# Patient Record
Sex: Male | Born: 1960 | Race: White | Hispanic: No | Marital: Single | Smoking: Current every day smoker
Health system: Southern US, Community
[De-identification: ages and names within clinical notes are randomized; demographics above are authoritative.]

## PROBLEM LIST (undated history)

## (undated) ENCOUNTER — Emergency Department (HOSPITAL_BASED_OUTPATIENT_CLINIC_OR_DEPARTMENT_OTHER): Admission: EM | Payer: 59 | Source: Home / Self Care

## (undated) DIAGNOSIS — I10 Essential (primary) hypertension: Secondary | ICD-10-CM

## (undated) DIAGNOSIS — I2510 Atherosclerotic heart disease of native coronary artery without angina pectoris: Secondary | ICD-10-CM

## (undated) DIAGNOSIS — I483 Typical atrial flutter: Secondary | ICD-10-CM

## (undated) DIAGNOSIS — E039 Hypothyroidism, unspecified: Secondary | ICD-10-CM

## (undated) DIAGNOSIS — T7840XA Allergy, unspecified, initial encounter: Secondary | ICD-10-CM

## (undated) DIAGNOSIS — IMO0002 Reserved for concepts with insufficient information to code with codable children: Secondary | ICD-10-CM

## (undated) DIAGNOSIS — R011 Cardiac murmur, unspecified: Secondary | ICD-10-CM

## (undated) DIAGNOSIS — E119 Type 2 diabetes mellitus without complications: Secondary | ICD-10-CM

## (undated) DIAGNOSIS — I700 Atherosclerosis of aorta: Secondary | ICD-10-CM

## (undated) DIAGNOSIS — I4892 Unspecified atrial flutter: Secondary | ICD-10-CM

## (undated) DIAGNOSIS — Z87442 Personal history of urinary calculi: Secondary | ICD-10-CM

## (undated) DIAGNOSIS — J449 Chronic obstructive pulmonary disease, unspecified: Secondary | ICD-10-CM

## (undated) DIAGNOSIS — E559 Vitamin D deficiency, unspecified: Secondary | ICD-10-CM

## (undated) DIAGNOSIS — E781 Pure hyperglyceridemia: Secondary | ICD-10-CM

## (undated) DIAGNOSIS — K219 Gastro-esophageal reflux disease without esophagitis: Secondary | ICD-10-CM

## (undated) DIAGNOSIS — F329 Major depressive disorder, single episode, unspecified: Secondary | ICD-10-CM

## (undated) DIAGNOSIS — E291 Testicular hypofunction: Secondary | ICD-10-CM

## (undated) DIAGNOSIS — E785 Hyperlipidemia, unspecified: Secondary | ICD-10-CM

## (undated) DIAGNOSIS — Z5189 Encounter for other specified aftercare: Secondary | ICD-10-CM

## (undated) DIAGNOSIS — N189 Chronic kidney disease, unspecified: Secondary | ICD-10-CM

## (undated) DIAGNOSIS — Z720 Tobacco use: Secondary | ICD-10-CM

## (undated) HISTORY — DX: Typical atrial flutter: I48.3

## (undated) HISTORY — DX: Tobacco use: Z72.0

## (undated) HISTORY — DX: Gastro-esophageal reflux disease without esophagitis: K21.9

## (undated) HISTORY — DX: Atherosclerotic heart disease of native coronary artery without angina pectoris: I25.10

## (undated) HISTORY — DX: Testicular hypofunction: E29.1

## (undated) HISTORY — DX: Vitamin D deficiency, unspecified: E55.9

## (undated) HISTORY — DX: Hyperlipidemia, unspecified: E78.5

## (undated) HISTORY — DX: Reserved for concepts with insufficient information to code with codable children: IMO0002

## (undated) HISTORY — DX: Pure hyperglyceridemia: E78.1

## (undated) HISTORY — DX: Atherosclerosis of aorta: I70.0

## (undated) HISTORY — DX: Essential (primary) hypertension: I10

## (undated) HISTORY — DX: Chronic kidney disease, unspecified: N18.9

## (undated) HISTORY — DX: Allergy, unspecified, initial encounter: T78.40XA

## (undated) HISTORY — PX: ESOPHAGOGASTRODUODENOSCOPY: SHX1529

## (undated) HISTORY — PX: UPPER GASTROINTESTINAL ENDOSCOPY: SHX188

## (undated) HISTORY — PX: LUMBAR DISC SURGERY: SHX700

## (undated) HISTORY — PX: COLONOSCOPY: SHX174

## (undated) HISTORY — DX: Encounter for other specified aftercare: Z51.89

## (undated) HISTORY — DX: Unspecified atrial flutter: I48.92

## (undated) HISTORY — DX: Type 2 diabetes mellitus without complications: E11.9

---
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
## 1989-04-24 HISTORY — PX: CERVICAL FUSION: SHX112

## 1995-10-23 HISTORY — PX: LUMBAR LAMINECTOMY: SHX95

## 1997-12-16 ENCOUNTER — Emergency Department (HOSPITAL_COMMUNITY): Admission: EM | Admit: 1997-12-16 | Discharge: 1997-12-16 | Payer: Self-pay | Admitting: Emergency Medicine

## 1999-04-25 HISTORY — PX: LUMBAR FUSION: SHX111

## 1999-07-01 ENCOUNTER — Emergency Department (HOSPITAL_COMMUNITY): Admission: EM | Admit: 1999-07-01 | Discharge: 1999-07-01 | Payer: Self-pay | Admitting: Emergency Medicine

## 1999-12-07 ENCOUNTER — Ambulatory Visit (HOSPITAL_COMMUNITY): Admission: RE | Admit: 1999-12-07 | Discharge: 1999-12-07 | Payer: Self-pay | Admitting: Family Medicine

## 1999-12-07 ENCOUNTER — Encounter: Payer: Self-pay | Admitting: Family Medicine

## 2000-01-19 ENCOUNTER — Encounter: Payer: Self-pay | Admitting: Neurosurgery

## 2000-01-23 ENCOUNTER — Inpatient Hospital Stay (HOSPITAL_COMMUNITY): Admission: RE | Admit: 2000-01-23 | Discharge: 2000-01-26 | Payer: Self-pay | Admitting: Neurosurgery

## 2000-01-23 ENCOUNTER — Encounter: Payer: Self-pay | Admitting: Neurosurgery

## 2000-02-16 ENCOUNTER — Encounter: Admission: RE | Admit: 2000-02-16 | Discharge: 2000-02-16 | Payer: Self-pay | Admitting: Neurosurgery

## 2000-02-16 ENCOUNTER — Encounter: Payer: Self-pay | Admitting: Neurosurgery

## 2000-05-11 ENCOUNTER — Encounter: Payer: Self-pay | Admitting: Neurosurgery

## 2000-05-11 ENCOUNTER — Encounter: Admission: RE | Admit: 2000-05-11 | Discharge: 2000-05-11 | Payer: Self-pay | Admitting: Neurosurgery

## 2001-04-09 ENCOUNTER — Ambulatory Visit (HOSPITAL_COMMUNITY): Admission: RE | Admit: 2001-04-09 | Discharge: 2001-04-09 | Payer: Self-pay | Admitting: Internal Medicine

## 2001-04-09 ENCOUNTER — Encounter: Payer: Self-pay | Admitting: Internal Medicine

## 2002-08-01 ENCOUNTER — Encounter: Payer: Self-pay | Admitting: Internal Medicine

## 2002-08-01 ENCOUNTER — Ambulatory Visit (HOSPITAL_COMMUNITY): Admission: RE | Admit: 2002-08-01 | Discharge: 2002-08-01 | Payer: Self-pay | Admitting: Internal Medicine

## 2013-04-08 ENCOUNTER — Other Ambulatory Visit: Payer: Self-pay | Admitting: Internal Medicine

## 2013-04-14 ENCOUNTER — Encounter: Payer: Self-pay | Admitting: Internal Medicine

## 2013-04-14 ENCOUNTER — Ambulatory Visit (INDEPENDENT_AMBULATORY_CARE_PROVIDER_SITE_OTHER): Payer: 59 | Admitting: Internal Medicine

## 2013-04-14 VITALS — BP 142/82 | HR 64 | Temp 98.2°F | Resp 18 | Wt 186.6 lb

## 2013-04-14 DIAGNOSIS — N529 Male erectile dysfunction, unspecified: Secondary | ICD-10-CM

## 2013-04-14 DIAGNOSIS — E119 Type 2 diabetes mellitus without complications: Secondary | ICD-10-CM

## 2013-04-14 DIAGNOSIS — Z79899 Other long term (current) drug therapy: Secondary | ICD-10-CM

## 2013-04-14 DIAGNOSIS — I10 Essential (primary) hypertension: Secondary | ICD-10-CM

## 2013-04-14 DIAGNOSIS — M545 Low back pain, unspecified: Secondary | ICD-10-CM | POA: Insufficient documentation

## 2013-04-14 DIAGNOSIS — E1129 Type 2 diabetes mellitus with other diabetic kidney complication: Secondary | ICD-10-CM | POA: Insufficient documentation

## 2013-04-14 DIAGNOSIS — E1169 Type 2 diabetes mellitus with other specified complication: Secondary | ICD-10-CM | POA: Insufficient documentation

## 2013-04-14 DIAGNOSIS — M5136 Other intervertebral disc degeneration, lumbar region: Secondary | ICD-10-CM | POA: Insufficient documentation

## 2013-04-14 DIAGNOSIS — E349 Endocrine disorder, unspecified: Secondary | ICD-10-CM | POA: Insufficient documentation

## 2013-04-14 DIAGNOSIS — E782 Mixed hyperlipidemia: Secondary | ICD-10-CM

## 2013-04-14 DIAGNOSIS — E559 Vitamin D deficiency, unspecified: Secondary | ICD-10-CM

## 2013-04-14 LAB — CBC WITH DIFFERENTIAL/PLATELET
Eosinophils Absolute: 0.2 10*3/uL (ref 0.0–0.7)
Eosinophils Relative: 2 % (ref 0–5)
Hemoglobin: 16.6 g/dL (ref 13.0–17.0)
Lymphocytes Relative: 35 % (ref 12–46)
Lymphs Abs: 3.5 10*3/uL (ref 0.7–4.0)
MCH: 30 pg (ref 26.0–34.0)
MCV: 86.6 fL (ref 78.0–100.0)
Monocytes Relative: 9 % (ref 3–12)
Neutro Abs: 5.3 10*3/uL (ref 1.7–7.7)
Neutrophils Relative %: 54 % (ref 43–77)
Platelets: 240 10*3/uL (ref 150–400)
RBC: 5.53 MIL/uL (ref 4.22–5.81)
RDW: 14.1 % (ref 11.5–15.5)
WBC: 9.9 10*3/uL (ref 4.0–10.5)

## 2013-04-14 LAB — LIPID PANEL
Cholesterol: 121 mg/dL (ref 0–200)
HDL: 30 mg/dL — ABNORMAL LOW (ref >39)
LDL Cholesterol: 41 mg/dL (ref 0–99)
Total CHOL/HDL Ratio: 4 Ratio
VLDL: 50 mg/dL — ABNORMAL HIGH (ref 0–40)

## 2013-04-14 LAB — HEMOGLOBIN A1C: Mean Plasma Glucose: 120 mg/dL — ABNORMAL HIGH (ref <117)

## 2013-04-14 LAB — BASIC METABOLIC PANEL WITH GFR
CO2: 26 mEq/L (ref 19–32)
Calcium: 9.7 mg/dL (ref 8.4–10.5)
Chloride: 105 mEq/L (ref 96–112)
GFR, Est Non African American: 85 mL/min
Glucose, Bld: 75 mg/dL (ref 70–99)
Sodium: 137 mEq/L (ref 135–145)

## 2013-04-14 NOTE — Progress Notes (Signed)
Patient ID: Paul Gates, male   DOB: December 21, 1960, 52 y.o.   MRN: [id number]   This very nice 52 y.o.male presents for 3 month follow up with Hypertension, Hyperlipidemia, T2 Diabetes and Vitamin D Deficiency.    Patient has labile HTN monitored expectantly at least since Dec 2013. BP has been controlled at home. Today's BP: 142/82 mmHg . Patient denies any cardiac type chest pain, palpitations, dyspnea/orthopnea/PND, dizziness, claudication, or dependent edema.   Hyperlipidemia is controlled with diet & meds. Last Cholesterol was 95, Triglycerides were elevated at 174, HDL  Low at 30, and LDL likewise 30.    Also, the patient has history of T2 Diabetes diagnosed in Sept 2013  with last A1c of 6.1% in Sept this year. Patient denies any symptoms of reactive hypoglycemia, diabetic polys, paresthesias or visual blurring.   Further, Patient has history of Vitamin D Deficiency with last vitamin D of 113 in Sept and dose was advised to decrease. Patient supplements vitamin D without any suspected side-effects.  Medication Sig Dispense Refill   Metformin 500 mg ER  1 tab tid pc     Naprosyn 500 mg   1 tab bid prn      Flexeril 10 mg  prn     Vit D 5,000 u  bid     . testosterone cypionate (DEPOTESTOTERONE CYPIONATE) 200 MG/ML injection INJECT 2 ML INTRAMUSCULARLY EVERY 2 WEEKS  10 mL  2      No Known Allergies  PMHx:   Past Medical History  Diagnosis Date  . Hypertension   . Hyperlipidemia   . Diabetes mellitus without complication   . DDD (degenerative disc disease)   . Vitamin D deficiency   . Hypogonadism male   . GERD (gastroesophageal reflux disease)     FHx:    Reviewed / unchanged  SHx:    Reviewed / unchanged  Systems Review: Constitutional: Denies fever, chills, wt changes, headaches, insomnia, fatigue, night sweats, change in appetite. Eyes: Denies redness, blurred vision, diplopia, discharge, itchy, watery eyes.  ENT: Denies discharge, congestion, post nasal drip,  epistaxis, sore throat, earache, hearing loss, dental pain, tinnitus, vertigo, sinus pain, snoring.  CV: Denies chest pain, palpitations, irregular heartbeat, syncope, dyspnea, diaphoresis, orthopnea, PND, claudication, edema. Respiratory: denies cough, dyspnea, DOE, pleurisy, hoarseness, laryngitis, wheezing.  Gastrointestinal: Denies dysphagia, odynophagia, heartburn, reflux, water brash, abdominal pain or cramps, nausea, vomiting, bloating, diarrhea, constipation, hematemesis, melena, hematochezia,  or hemorrhoids. Genitourinary: Denies dysuria, frequency, urgency, nocturia, hesitancy, discharge, hematuria, flank pain. Musculoskeletal: chronic low back pain/ no sciatica  Skin: Denies pruritus, rash, hives, warts, acne, eczema, change in skin lesion(s). Neuro: No weakness, tremor, incoordination, spasms, paresthesia, or pain. Psychiatric: Denies confusion, memory loss, or sensory loss. Endo: Denies change in weight, skin, hair change.  Heme/Lymph: No excessive bleeding, bruising, orenlarged lymph nodes.  BP: 142/82  Pulse: 64  Temp: 98.2 F (36.8 C)  Resp: 18     On Exam: Appears well nourished - in no distress. Eyes: PERRLA, EOMs, conjunctiva no swelling or erythema. Sinuses: No frontal/maxillary tenderness ENT/Mouth: EAC's clear, TM's nl w/o erythema, bulging. Nares clear w/o erythema, swelling, exudates. Oropharynx clear without erythema or exudates. Oral hygiene is good. Tongue normal, non obstructing. Hearing intact.  Neck: Supple. Thyroid nl. Car 2+/2+ without bruits, nodes or JVD. Chest: Respirations nl with BS clear & equal w/o rales, rhonchi, wheezing or stridor.  Cor: Heart sounds normal w/ regular rate and rhythm without sig. murmurs, gallops, clicks, or rubs. Peripheral pulses  normal and equal  without edema.  Abdomen: Soft & bowel sounds normal. Non-tender w/o guarding, rebound, hernias, masses, or organomegaly.  Lymphatics: Unremarkable.  Musculoskeletal: Full ROM all  peripheral extremities, joint stability, 5/5 strength, and normal gait.  Skin: Warm, dry without exposed rashes, lesions, ecchymosis apparent.  Neuro: Cranial nerves intact, reflexes equal bilaterally. Sensory-motor testing grossly intact. Tendon reflexes grossly intact.  Pysch: Alert & oriented x 3. Insight and judgement nl & appropriate. No ideations.  Assessment and Plan:  1. Hypertension - Continue monitor blood pressure at home. Continue diet/meds same.  2. Hyperlipidemia - Continue diet/meds, exercise,& lifestyle modifications. Continue monitor periodic cholesterol/liver & renal functions   3. Diabetes - continue recommend prudent low glycemic diet, weight control, regular exercise, diabetic monitoring and periodic eye exams.  4. Vitamin D Deficiency - Continue supplementation.  Recommended regular exercise, BP monitoring, weight control, and discussed med and SE's. Recommended labs to assess and monitor clinical status. Further disposition pending results of labs.

## 2013-04-14 NOTE — Patient Instructions (Signed)

## 2013-04-15 LAB — INSULIN, FASTING: Insulin fasting, serum: 36 u[IU]/mL — ABNORMAL HIGH (ref 3–28)

## 2013-04-15 LAB — TESTOSTERONE: Testosterone: 251 ng/dL — ABNORMAL LOW (ref 300–890)

## 2013-04-15 LAB — VITAMIN D 25 HYDROXY (VIT D DEFICIENCY, FRACTURES): Vit D, 25-Hydroxy: 87 ng/mL (ref 30–89)

## 2013-04-16 ENCOUNTER — Other Ambulatory Visit: Payer: Self-pay | Admitting: Internal Medicine

## 2013-04-16 ENCOUNTER — Encounter: Payer: Self-pay | Admitting: Internal Medicine

## 2013-04-16 MED ORDER — LEVOTHYROXINE SODIUM 50 MCG PO TABS: 50.0000 ug | ORAL_TABLET | Freq: Every day | ORAL | Status: DC

## 2013-04-17 ENCOUNTER — Other Ambulatory Visit: Payer: Self-pay | Admitting: Internal Medicine

## 2013-04-18 ENCOUNTER — Other Ambulatory Visit: Payer: Self-pay | Admitting: Emergency Medicine

## 2013-04-19 ENCOUNTER — Encounter: Payer: Self-pay | Admitting: Internal Medicine

## 2013-04-20 ENCOUNTER — Encounter: Payer: Self-pay | Admitting: Internal Medicine

## 2013-04-21 ENCOUNTER — Telehealth: Payer: Self-pay | Admitting: Internal Medicine

## 2013-04-21 NOTE — Telephone Encounter (Signed)
Spoke with patient about TSH result.  Patient will get rx and scheduled NV to check TSH.

## 2013-05-22 ENCOUNTER — Ambulatory Visit: Payer: Self-pay

## 2013-05-26 ENCOUNTER — Ambulatory Visit: Payer: Self-pay

## 2013-05-27 ENCOUNTER — Ambulatory Visit (INDEPENDENT_AMBULATORY_CARE_PROVIDER_SITE_OTHER): Payer: 59 | Admitting: *Deleted

## 2013-05-27 DIAGNOSIS — E039 Hypothyroidism, unspecified: Secondary | ICD-10-CM

## 2013-05-27 NOTE — Progress Notes (Signed)
Patient ID: Paul Gates, male   DOB: 1960/08/13, 53 y.o.   MRN: [id number] Patient presents today for recheck TSH levels.  Patient states he started on Levothyroxine 50 mcg as directed at last office visit and takes every morning.  Advised patient we will check TSH today and release results to MyChart for him to view as soon as resulted.

## 2013-05-28 LAB — TSH: TSH: 3.756 u[IU]/mL (ref 0.350–4.500)

## 2013-07-17 ENCOUNTER — Ambulatory Visit (INDEPENDENT_AMBULATORY_CARE_PROVIDER_SITE_OTHER): Payer: 59 | Admitting: Physician Assistant

## 2013-07-17 ENCOUNTER — Encounter: Payer: Self-pay | Admitting: Physician Assistant

## 2013-07-17 VITALS — BP 130/70 | HR 76 | Temp 98.6°F | Resp 16 | Ht 70.0 in | Wt 190.0 lb

## 2013-07-17 DIAGNOSIS — E559 Vitamin D deficiency, unspecified: Secondary | ICD-10-CM

## 2013-07-17 DIAGNOSIS — Z1211 Encounter for screening for malignant neoplasm of colon: Secondary | ICD-10-CM

## 2013-07-17 DIAGNOSIS — E782 Mixed hyperlipidemia: Secondary | ICD-10-CM

## 2013-07-17 DIAGNOSIS — I10 Essential (primary) hypertension: Secondary | ICD-10-CM

## 2013-07-17 DIAGNOSIS — E349 Endocrine disorder, unspecified: Secondary | ICD-10-CM

## 2013-07-17 DIAGNOSIS — Z79899 Other long term (current) drug therapy: Secondary | ICD-10-CM

## 2013-07-17 DIAGNOSIS — Z23 Encounter for immunization: Secondary | ICD-10-CM

## 2013-07-17 DIAGNOSIS — E119 Type 2 diabetes mellitus without complications: Secondary | ICD-10-CM

## 2013-07-17 NOTE — Patient Instructions (Signed)
 Bad carbs also include fruit juice, alcohol, and sweet tea. These are empty calories that do not signal to your brain that you are full.   Please remember the good carbs are still carbs which convert into sugar. So please measure them out no more than 1/2-1 cup of rice, oatmeal, pasta, and beans.  Veggies are however free foods! Pile them on.   I like lean protein at every meal such as chicken, turkey, pork chops, cottage cheese, etc. Just do not fry these meats and please center your meal around vegetable, the meats should be a side dish.   No all fruit is created equal. Please see the list below, the fruit at the bottom is higher in sugars than the fruit at the top   Preventative Care for Adults, Male       REGULAR HEALTH EXAMS:  A routine yearly physical is a good way to check in with your primary care provider about your health and preventive screening. It is also an opportunity to share updates about your health and any concerns you have, and receive a thorough all-over exam.   Most health insurance companies pay for at least some preventative services.  Check with your health plan for specific coverages.  WHAT PREVENTATIVE SERVICES DO MEN NEED?  Adult men should have their weight and blood pressure checked regularly.   Men age 35 and older should have their cholesterol levels checked regularly.  Beginning at age 50 and continuing to age 75, men should be screened for colorectal cancer.  Certain people should may need continued testing until age 85.  Other cancer screening may include exams for testicular and prostate cancer.  Updating vaccinations is part of preventative care.  Vaccinations help protect against diseases such as the flu.  Lab tests are generally done as part of preventative care to screen for anemia and blood disorders, to screen for problems with the kidneys and liver, to screen for bladder problems, to check blood sugar, and to check your cholesterol  level.  Preventative services generally include counseling about diet, exercise, avoiding tobacco, drugs, excessive alcohol consumption, and sexually transmitted infections.    GENERAL RECOMMENDATIONS FOR GOOD HEALTH:  Healthy diet:  Eat a variety of foods, including fruit, vegetables, animal or vegetable protein, such as meat, fish, chicken, and eggs, or beans, lentils, tofu, and grains, such as rice.  Drink plenty of water daily.  Decrease saturated fat in the diet, avoid lots of red meat, processed foods, sweets, fast foods, and fried foods.  Exercise:  Aerobic exercise helps maintain good heart health. At least 30-40 minutes of moderate-intensity exercise is recommended. For example, a brisk walk that increases your heart rate and breathing. This should be done on most days of the week.   Find a type of exercise or a variety of exercises that you enjoy so that it becomes a part of your daily life.  Examples are running, walking, swimming, water aerobics, and biking.  For motivation and support, explore group exercise such as aerobic class, spin class, Zumba, Yoga,or  martial arts, etc.    Set exercise goals for yourself, such as a certain weight goal, walk or run in a race such as a 5k walk/run.  Speak to your primary care provider about exercise goals.  Disease prevention:  If you smoke or chew tobacco, find out from your caregiver how to quit. It can literally save your life, no matter how long you have been a tobacco user. If you   do not use tobacco, never begin.   Maintain a healthy diet and normal weight. Increased weight leads to problems with blood pressure and diabetes.   The Body Mass Index or BMI is a way of measuring how much of your body is fat. Having a BMI above 27 increases the risk of heart disease, diabetes, hypertension, stroke and other problems related to obesity. Your caregiver can help determine your BMI and based on it develop an exercise and dietary program to  help you achieve or maintain this important measurement at a healthful level.  High blood pressure causes heart and blood vessel problems.  Persistent high blood pressure should be treated with medicine if weight loss and exercise do not work.   Fat and cholesterol leaves deposits in your arteries that can block them. This causes heart disease and vessel disease elsewhere in your body.  If your cholesterol is found to be high, or if you have heart disease or certain other medical conditions, then you may need to have your cholesterol monitored frequently and be treated with medication.   Ask if you should have a stress test if your history suggests this. A stress test is a test done on a treadmill that looks for heart disease. This test can find disease prior to there being a problem.  Avoid drinking alcohol in excess (more than two drinks per day).  Avoid use of street drugs. Do not share needles with anyone. Ask for professional help if you need assistance or instructions on stopping the use of alcohol, cigarettes, and/or drugs.  Brush your teeth twice a day with fluoride toothpaste, and floss once a day. Good oral hygiene prevents tooth decay and gum disease. The problems can be painful, unattractive, and can cause other health problems. Visit your dentist for a routine oral and dental check up and preventive care every 6-12 months.   Look at your skin regularly.  Use a mirror to look at your back. Notify your caregivers of changes in moles, especially if there are changes in shapes, colors, a size larger than a pencil eraser, an irregular border, or development of new moles.  Safety:  Use seatbelts 100% of the time, whether driving or as a passenger.  Use safety devices such as hearing protection if you work in environments with loud noise or significant background noise.  Use safety glasses when doing any work that could send debris in to the eyes.  Use a helmet if you ride a bike or motorcycle.   Use appropriate safety gear for contact sports.  Talk to your caregiver about gun safety.  Use sunscreen with a SPF (or skin protection factor) of 15 or greater.  Lighter skinned people are at a greater risk of skin cancer. Don't forget to also wear sunglasses in order to protect your eyes from too much damaging sunlight. Damaging sunlight can accelerate cataract formation.   Practice safe sex. Use condoms. Condoms are used for birth control and to help reduce the spread of sexually transmitted infections (or STIs).  Some of the STIs are gonorrhea (the clap), chlamydia, syphilis, trichomonas, herpes, HPV (human papilloma virus) and HIV (human immunodeficiency virus) which causes AIDS. The herpes, HIV and HPV are viral illnesses that have no cure. These can result in disability, cancer and death.   Keep carbon monoxide and smoke detectors in your home functioning at all times. Change the batteries every 6 months or use a model that plugs into the wall.   Vaccinations:  Stay   up to date with your tetanus shots and other required immunizations. You should have a booster for tetanus every 10 years. Be sure to get your flu shot every year, since 5%-20% of the [place] population comes down with the flu. The flu vaccine changes each year, so being vaccinated once is not enough. Get your shot in the fall, before the flu season peaks.   Other vaccines to consider:  Pneumococcal vaccine to protect against certain types of pneumonia.  This is normally recommended for adults age 65 or older.  However, adults younger than 51 years old with certain underlying conditions such as diabetes, heart or lung disease should also receive the vaccine.  Shingles vaccine to protect against Varicella Zoster if you are older than age 60, or younger than 51 years old with certain underlying illness.  Hepatitis A vaccine to protect against a form of infection of the liver by a virus acquired from food.  Hepatitis B vaccine to  protect against a form of infection of the liver by a virus acquired from blood or body fluids, particularly if you work in health care.  If you plan to travel internationally, check with your local health department for specific vaccination recommendations.  Cancer Screening:  Most routine colon cancer screening begins at the age of 50. On a yearly basis, doctors may provide special easy to use take-home tests to check for hidden blood in the stool. Sigmoidoscopy or colonoscopy can detect the earliest forms of colon cancer and is life saving. These tests use a small camera at the end of a tube to directly examine the colon. Speak to your caregiver about this at age 50, when routine screening begins (and is repeated every 5 years unless early forms of pre-cancerous polyps or small growths are found).   At the age of 50 men usually start screening for prostate cancer every year. Screening may begin at a younger age for those with higher risk. Those at higher risk include African-Americans or having a family history of prostate cancer. There are two types of tests for prostate cancer:   Prostate-specific antigen (PSA) testing. Recent studies raise questions about prostate cancer using PSA and you should discuss this with your caregiver.   Digital rectal exam (in which your doctor's lubricated and gloved finger feels for enlargement of the prostate through the anus).   Screening for testicular cancer.  Do a monthly exam of your testicles. Gently roll each testicle between your thumb and fingers, feeling for any abnormal lumps. The best time to do this is after a hot shower or bath when the tissues are looser. Notify your caregivers of any lumps, tenderness or changes in size or shape immediately.     

## 2013-07-17 NOTE — Progress Notes (Signed)
HPI 53 y.o. male  presents for 3 month follow up with hypertension, hyperlipidemia, diabetes and vitamin D. His blood pressure has been controlled at home, today their BP is BP: 130/70 mmHg He does workout, bikes during the summer. He denies chest pain, shortness of breath, dizziness.  He is not on cholesterol medication and denies myalgias. His cholesterol is at goal. The cholesterol last visit was:   Lab Results  Component Value Date   CHOL 121 04/14/2013   HDL 30* 04/14/2013   LDLCALC 41 04/14/2013   TRIG 252* 04/14/2013   CHOLHDL 4.0 04/14/2013   He has been working on diet and exercise for Diabetes, and denies paresthesia of the feet, polydipsia, polyuria and visual disturbances. Last A1C in the office was:  Lab Results  Component Value Date   HGBA1C 5.8* 04/14/2013   Patient is on Vitamin D supplement.   Overdue for colonoscopy- will refer for this.   Clinical Support on 05/27/2013  Component Date Value Ref Range Status  . TSH 05/27/2013 3.756  0.350 - 4.500 uIU/mL Final   Current Medications:  Current Outpatient Prescriptions on File Prior to Visit  Medication Sig Dispense Refill  . Cholecalciferol (VITAMIN D-3) 5000 UNITS TABS Take 10,000 Units by mouth daily.      . cyclobenzaprine (FLEXERIL) 10 MG tablet Take 10 mg by mouth daily. Takes prn      . levothyroxine (SYNTHROID) 50 MCG tablet Take 1 tablet (50 mcg total) by mouth daily before breakfast.  90 tablet  99  . metFORMIN (GLUCOPHAGE-XR) 500 MG 24 hr tablet Take 500 mg by mouth 3 (three) times daily with meals.       . Multiple Vitamin (MULTIVITAMIN) tablet Take 1 tablet by mouth daily.      . naproxen (NAPROSYN) 500 MG tablet Take 500 mg by mouth 2 (two) times daily with a meal. Takes prn      . testosterone cypionate (DEPOTESTOTERONE CYPIONATE) 200 MG/ML injection INJECT 2 ML INTRAMUSCULARLY EVERY 2 WEEKS  10 mL  2   No current facility-administered medications on file prior to visit.   Medical History:  Past  Medical History  Diagnosis Date  . Hypertension   . Hyperlipidemia   . Diabetes mellitus without complication   . DDD (degenerative disc disease)   . Vitamin D deficiency   . Hypogonadism male   . GERD (gastroesophageal reflux disease)    Allergies: No Known Allergies   Review of Systems: [X]  = complains of  [ ]  = denies  General: Fatigue [ ]  Fever [ ]  Chills [ ]  Weakness [ ]   Insomnia [ ]  Eyes: Redness [ ]  Blurred vision [ ]  Diplopia [ ]   ENT: Congestion [ ]  Sinus Pain [ ]  Post Nasal Drip [ ]  Sore Throat [ ]  Earache [ ]   Cardiac: Chest pain/pressure [ ]  SOB [ ]  Orthopnea [ ]   Palpitations [ ]   Paroxysmal nocturnal dyspnea[ ]  Claudication [ ]  Edema [ ]   Pulmonary: Cough [ ]  Wheezing[ ]   SOB [ ]   Snoring [ ]   GI: Nausea [ ]  Vomiting[ ]  Dysphagia[ ]  Heartburn[ ]  Abdominal pain [ ]  Constipation [ ] ; Diarrhea [ ] ; BRBPR [ ]  Melena[ ]  GU: Hematuria[ ]  Dysuria [ ]  Nocturia[ ]  Urgency [ ]   Hesitancy [ ]  Discharge [ ]  Neuro: Headaches[ ]  Vertigo[ ]  Paresthesias[ ]  Spasm [ ]  Speech changes [ ]  Incoordination [ ]   Ortho: Arthritis [ ]  Joint pain [ ]  Muscle pain [ ]  Joint swelling [ ]   Back Pain [ ]  Skin:  Rash [ ]   Pruritis [ ]  Change in skin lesion [ ]   Psych: Depression[ ]  Anxiety[ ]  Confusion [ ]  Memory loss [ ]   Heme/Lypmh: Bleeding [ ]  Bruising [ ]  Enlarged lymph nodes [ ]   Endocrine: Visual blurring [ ]  Paresthesia [ ]  Polyuria [ ]  Polydypsea [ ]    Heat/cold intolerance [ ]  Hypoglycemia [ ]   Family history- Review and unchanged Social history- Review and unchanged Physical Exam: BP 130/70  Pulse 76  Temp(Src) 98.6 F (37 C)  Resp 16  Ht 5\' 10"  (1.778 m)  Wt 190 lb (86.183 kg)  BMI 27.26 kg/m2 Wt Readings from Last 3 Encounters:  07/17/13 190 lb (86.183 kg)  04/14/13 186 lb 9.6 oz (84.641 kg)   General Appearance: Well nourished, in no apparent distress. Eyes: PERRLA, EOMs, conjunctiva no swelling or erythema Sinuses: No Frontal/maxillary tenderness ENT/Mouth: Ext aud canals  clear, TMs without erythema, bulging. No erythema, swelling, or exudate on post pharynx.  Tonsils not swollen or erythematous. Hearing normal.  Neck: Supple, thyroid normal.  Respiratory: Respiratory effort normal, BS equal bilaterally without rales, rhonchi, wheezing or stridor.  Cardio: RRR with no MRGs. Brisk peripheral pulses without edema.  Abdomen: Soft, + BS.  Non tender, no guarding, rebound, hernias, masses. Lymphatics: Non tender without lymphadenopathy.  Musculoskeletal: Full ROM, 5/5 strength, normal gait.  Skin: Warm, dry without rashes, lesions, ecchymosis.  Neuro: Cranial nerves intact. No cerebellar symptoms. Sensation intact.  Psych: Awake and oriented X 3, normal affect, Insight and Judgment appropriate.   Assessment and Plan:  Hypertension: Continue medication, monitor blood pressure at home.  Continue DASH diet. Cholesterol: Continue diet and exercise. Check cholesterol.  Diabetes-Continue diet and exercise. Check A1C Vitamin D Def- check level and continue medications.  Colonoscopy- will schedule TDAP today  Continue diet and meds as discussed. Further disposition pending results of labs. Discussed med's effects and SE's.    Vicie Mutters 3:55 PM

## 2013-07-18 ENCOUNTER — Encounter: Payer: Self-pay | Admitting: Internal Medicine

## 2013-07-18 LAB — CBC WITH DIFFERENTIAL/PLATELET
Basophils Absolute: 0 10*3/uL (ref 0.0–0.1)
Basophils Relative: 0 % (ref 0–1)
EOS ABS: 0.1 10*3/uL (ref 0.0–0.7)
EOS PCT: 1 % (ref 0–5)
HCT: 45.9 % (ref 39.0–52.0)
HEMOGLOBIN: 15.7 g/dL (ref 13.0–17.0)
LYMPHS ABS: 3.4 10*3/uL (ref 0.7–4.0)
Lymphocytes Relative: 33 % (ref 12–46)
MCH: 29.9 pg (ref 26.0–34.0)
MCHC: 34.2 g/dL (ref 30.0–36.0)
MCV: 87.4 fL (ref 78.0–100.0)
MONO ABS: 0.8 10*3/uL (ref 0.1–1.0)
MONOS PCT: 8 % (ref 3–12)
Neutro Abs: 6 10*3/uL (ref 1.7–7.7)
Neutrophils Relative %: 58 % (ref 43–77)
Platelets: 237 10*3/uL (ref 150–400)
RBC: 5.25 MIL/uL (ref 4.22–5.81)
RDW: 14.1 % (ref 11.5–15.5)
WBC: 10.4 10*3/uL (ref 4.0–10.5)

## 2013-07-18 LAB — TSH: TSH: 1.892 u[IU]/mL (ref 0.350–4.500)

## 2013-07-18 LAB — HEPATIC FUNCTION PANEL
ALT: 20 U/L (ref 0–53)
AST: 18 U/L (ref 0–37)
Albumin: 4.2 g/dL (ref 3.5–5.2)
Alkaline Phosphatase: 59 U/L (ref 39–117)
BILIRUBIN DIRECT: 0.1 mg/dL (ref 0.0–0.3)
Indirect Bilirubin: 0.2 mg/dL (ref 0.2–1.2)
Total Bilirubin: 0.3 mg/dL (ref 0.2–1.2)
Total Protein: 6.3 g/dL (ref 6.0–8.3)

## 2013-07-18 LAB — BASIC METABOLIC PANEL WITH GFR
BUN: 19 mg/dL (ref 6–23)
CO2: 27 meq/L (ref 19–32)
CREATININE: 0.94 mg/dL (ref 0.50–1.35)
Calcium: 9.5 mg/dL (ref 8.4–10.5)
Chloride: 105 mEq/L (ref 96–112)
GFR, Est African American: >89 mL/min
GFR, Est Non African American: >89 mL/min
GLUCOSE: 79 mg/dL (ref 70–99)
Potassium: 4.5 mEq/L (ref 3.5–5.3)
Sodium: 140 mEq/L (ref 135–145)

## 2013-07-18 LAB — LIPID PANEL
CHOL/HDL RATIO: 3.2 ratio
Cholesterol: 86 mg/dL (ref 0–200)
HDL: 27 mg/dL — ABNORMAL LOW (ref >39)
LDL CALC: 22 mg/dL (ref 0–99)
Triglycerides: 184 mg/dL — ABNORMAL HIGH (ref <150)
VLDL: 37 mg/dL (ref 0–40)

## 2013-07-18 LAB — VITAMIN D 25 HYDROXY (VIT D DEFICIENCY, FRACTURES): Vit D, 25-Hydroxy: 96 ng/mL — ABNORMAL HIGH (ref 30–89)

## 2013-07-18 LAB — MAGNESIUM: MAGNESIUM: 1.8 mg/dL (ref 1.5–2.5)

## 2013-07-18 LAB — HEMOGLOBIN A1C
HEMOGLOBIN A1C: 6 % — AB (ref <5.7)
Mean Plasma Glucose: 126 mg/dL — ABNORMAL HIGH (ref <117)

## 2013-08-06 ENCOUNTER — Encounter: Payer: Self-pay | Admitting: Physician Assistant

## 2013-08-06 ENCOUNTER — Telehealth: Payer: Self-pay | Admitting: *Deleted

## 2013-08-06 NOTE — Telephone Encounter (Signed)
PT was here in [facility] got test results on mychart & didn't see Testosterone level? Pt says this was low in Dec so he was seeing what he should do at this point?

## 2013-08-12 ENCOUNTER — Ambulatory Visit: Payer: Self-pay | Admitting: Physician Assistant

## 2013-08-13 ENCOUNTER — Ambulatory Visit (AMBULATORY_SURGERY_CENTER): Payer: Self-pay | Admitting: *Deleted

## 2013-08-13 VITALS — Ht 69.0 in | Wt 192.0 lb

## 2013-08-13 DIAGNOSIS — Z1211 Encounter for screening for malignant neoplasm of colon: Secondary | ICD-10-CM

## 2013-08-13 MED ORDER — MOVIPREP 100 G PO SOLR: ORAL | Status: DC

## 2013-08-13 NOTE — Progress Notes (Signed)
Patient denies any allergies to eggs or soy. Patient denies any problems with anesthesia. No oxygen use at home per patient. No diet/wt loss pills. Emmi instructions given to patient on colonoscopy.  

## 2013-08-15 ENCOUNTER — Encounter: Payer: Self-pay | Admitting: Internal Medicine

## 2013-08-26 ENCOUNTER — Ambulatory Visit: Payer: Self-pay | Admitting: Physician Assistant

## 2013-08-27 ENCOUNTER — Ambulatory Visit (AMBULATORY_SURGERY_CENTER): Payer: 59 | Admitting: Internal Medicine

## 2013-08-27 ENCOUNTER — Encounter: Payer: Self-pay | Admitting: Internal Medicine

## 2013-08-27 VITALS — BP 132/72 | HR 68 | Temp 98.2°F | Resp 14 | Ht 69.0 in | Wt 192.0 lb

## 2013-08-27 DIAGNOSIS — Z1211 Encounter for screening for malignant neoplasm of colon: Secondary | ICD-10-CM

## 2013-08-27 LAB — GLUCOSE, CAPILLARY
GLUCOSE-CAPILLARY: 76 mg/dL (ref 70–99)
GLUCOSE-CAPILLARY: 77 mg/dL (ref 70–99)
Glucose-Capillary: 82 mg/dL (ref 70–99)
Glucose-Capillary: 95 mg/dL (ref 70–99)

## 2013-08-27 MED ORDER — SODIUM CHLORIDE 0.9 % IV SOLN: 500.0000 mL | INTRAVENOUS | Status: DC

## 2013-08-27 NOTE — Progress Notes (Signed)
A/ox3 pleased with MAC, report to April RN 

## 2013-08-27 NOTE — Op Note (Signed)
[place]  [address]. [place], [id number]   COLONOSCOPY PROCEDURE REPORT  PATIENT: Paul Gates, Paul Gates  MR#: [id number] BIRTHDATE: 08/05/1960 , 52  yrs. old GENDER: Male ENDOSCOPIST: Lafayette Dragon, MD REFERRED [id number] Melford Aase, M.D. ,[place] Shows PROCEDURE DATE:  08/27/2013 PROCEDURE:   Colonoscopy, screening First Screening Colonoscopy - Avg.  risk and is 50 yrs.  old or older Yes.  Prior Negative Screening - Now for repeat screening. N/A  History of Adenoma - Now for follow-up colonoscopy & has been > or = to 3 yrs.  N/A  Polyps Removed Today? No.  Recommend repeat exam, <10 yrs? No. ASA CLASS:   Class I INDICATIONS:Average risk patient for colon cancer. MEDICATIONS: MAC sedation, administered by CRNA and propofol (Diprivan) 250mg  IV  DESCRIPTION OF PROCEDURE:   After the risks benefits and alternatives of the procedure were thoroughly explained, informed consent was obtained.  A digital rectal exam revealed no abnormalities of the rectum.   The LB [id number]  endoscope was introduced through the anus and advanced to the cecum, which was identified by both the appendix and ileocecal valve. No adverse events experienced.   The quality of the prep was good, using MoviPrep  The instrument was then slowly withdrawn as the colon was fully examined.      COLON FINDINGS: Small internal hemorrhoids were found.  Retroflexed views revealed no abnormalities. The time to cecum=2 minutes 05 seconds.  Withdrawal time=6 minutes 37 seconds.  The scope was withdrawn and the procedure completed. COMPLICATIONS: There were no complications.  ENDOSCOPIC IMPRESSION: Small internal hemorrhoids  RECOMMENDATIONS: high fiber diet Recall colonoscopy in 10 years   eSigned:  Lafayette Dragon, MD 08/27/2013 2:25 PM   cc:   PATIENT NAME:  [name], [name] MR#: [id number]

## 2013-08-27 NOTE — Patient Instructions (Signed)

## 2013-08-27 NOTE — Progress Notes (Signed)
Pt BS was 77 when getting to recovery, pt is asymptomatic, pt states he feels fine "that is a great BS".  Gave apple juice to pt while in recovery and rechecked BS, reading was 76, pt states he feels fine does not feel like BS is low. Pt is asymptomatic. Pt was given grape juice for the road, pt states BS never gets low and he is going to get something to eat as soon as he leaves. Pt is discharged.

## 2013-08-28 ENCOUNTER — Telehealth: Payer: Self-pay

## 2013-08-28 ENCOUNTER — Ambulatory Visit: Payer: Self-pay | Admitting: Physician Assistant

## 2013-08-28 NOTE — Telephone Encounter (Signed)
  Follow up Call-  Call back number 08/27/2013  Post procedure Call Back phone  # [phone]  Permission to leave phone message Yes     Patient questions:  Do you have a fever, pain , or abdominal swelling? no Pain Score  0 *  Have you tolerated food without any problems? yes  Have you been able to return to your normal activities? yes  Do you have any questions about your discharge instructions: Diet   no Medications  no Follow up visit  no  Do you have questions or concerns about your Care? no  Actions: * If pain score is 4 or above: No action needed, pain <4.  No problems per the pt. maw

## 2013-09-03 ENCOUNTER — Other Ambulatory Visit: Payer: Self-pay | Admitting: Internal Medicine

## 2013-09-09 ENCOUNTER — Other Ambulatory Visit: Payer: Self-pay | Admitting: Internal Medicine

## 2013-09-23 ENCOUNTER — Emergency Department (HOSPITAL_COMMUNITY): Payer: 59

## 2013-09-23 ENCOUNTER — Ambulatory Visit: Payer: Self-pay | Admitting: Physician Assistant

## 2013-09-23 ENCOUNTER — Encounter (HOSPITAL_COMMUNITY): Payer: Self-pay | Admitting: Emergency Medicine

## 2013-09-23 ENCOUNTER — Emergency Department (HOSPITAL_COMMUNITY)
Admission: EM | Admit: 2013-09-23 | Discharge: 2013-09-23 | Disposition: A | Discharge status: Home / Self Care | Payer: 59 | Attending: Emergency Medicine | Admitting: Emergency Medicine

## 2013-09-23 DIAGNOSIS — I10 Essential (primary) hypertension: Secondary | ICD-10-CM | POA: Insufficient documentation

## 2013-09-23 DIAGNOSIS — Z8719 Personal history of other diseases of the digestive system: Secondary | ICD-10-CM | POA: Insufficient documentation

## 2013-09-23 DIAGNOSIS — Z791 Long term (current) use of non-steroidal anti-inflammatories (NSAID): Secondary | ICD-10-CM | POA: Insufficient documentation

## 2013-09-23 DIAGNOSIS — Z8739 Personal history of other diseases of the musculoskeletal system and connective tissue: Secondary | ICD-10-CM | POA: Insufficient documentation

## 2013-09-23 DIAGNOSIS — N200 Calculus of kidney: Secondary | ICD-10-CM

## 2013-09-23 DIAGNOSIS — E119 Type 2 diabetes mellitus without complications: Secondary | ICD-10-CM | POA: Insufficient documentation

## 2013-09-23 DIAGNOSIS — Z79899 Other long term (current) drug therapy: Secondary | ICD-10-CM | POA: Insufficient documentation

## 2013-09-23 DIAGNOSIS — N23 Unspecified renal colic: Secondary | ICD-10-CM

## 2013-09-23 DIAGNOSIS — F17200 Nicotine dependence, unspecified, uncomplicated: Secondary | ICD-10-CM | POA: Insufficient documentation

## 2013-09-23 LAB — URINALYSIS, ROUTINE W REFLEX MICROSCOPIC
Glucose, UA: NEGATIVE mg/dL
Ketones, ur: NEGATIVE mg/dL
Nitrite: NEGATIVE
Protein, ur: 30 mg/dL — AB
Specific Gravity, Urine: 1.027 (ref 1.005–1.030)
Urobilinogen, UA: 1 mg/dL (ref 0.0–1.0)
pH: 6 (ref 5.0–8.0)

## 2013-09-23 LAB — BASIC METABOLIC PANEL
BUN: 22 mg/dL (ref 6–23)
CHLORIDE: 103 meq/L (ref 96–112)
CO2: 26 meq/L (ref 19–32)
Calcium: 9.5 mg/dL (ref 8.4–10.5)
Creatinine, Ser: 1.18 mg/dL (ref 0.50–1.35)
GFR calc Af Amer: 82 mL/min — ABNORMAL LOW (ref >90)
GFR calc non Af Amer: 70 mL/min — ABNORMAL LOW (ref >90)
Glucose, Bld: 118 mg/dL — ABNORMAL HIGH (ref 70–99)
Potassium: 5 mEq/L (ref 3.7–5.3)
Sodium: 139 mEq/L (ref 137–147)

## 2013-09-23 LAB — CBC
HEMATOCRIT: 47.5 % (ref 39.0–52.0)
Hemoglobin: 16.3 g/dL (ref 13.0–17.0)
MCH: 30.1 pg (ref 26.0–34.0)
MCHC: 34.3 g/dL (ref 30.0–36.0)
MCV: 87.6 fL (ref 78.0–100.0)
PLATELETS: 196 10*3/uL (ref 150–400)
RBC: 5.42 MIL/uL (ref 4.22–5.81)
RDW: 14.3 % (ref 11.5–15.5)
WBC: 12.1 10*3/uL — ABNORMAL HIGH (ref 4.0–10.5)

## 2013-09-23 LAB — URINE MICROSCOPIC-ADD ON

## 2013-09-23 MED ORDER — MORPHINE SULFATE 4 MG/ML IJ SOLN
4.0000 mg | Freq: Once | INTRAMUSCULAR | Status: AC
  Administered 2013-09-23: 4 mg via INTRAVENOUS
  Filled 2013-09-23: qty 1

## 2013-09-23 MED ORDER — OXYCODONE-ACETAMINOPHEN 5-325 MG PO TABS: 1.0000 | ORAL_TABLET | Freq: Three times a day (TID) | ORAL | Status: DC | PRN

## 2013-09-23 MED ORDER — ONDANSETRON HCL 4 MG PO TABS: 4.0000 mg | ORAL_TABLET | Freq: Four times a day (QID) | ORAL | Status: DC

## 2013-09-23 MED ORDER — SODIUM CHLORIDE 0.9 % IV BOLUS (SEPSIS): 250.0000 mL | Freq: Once | INTRAVENOUS | Status: DC

## 2013-09-23 MED ORDER — ONDANSETRON HCL 4 MG/2ML IJ SOLN
4.0000 mg | Freq: Once | INTRAMUSCULAR | Status: AC
  Administered 2013-09-23: 4 mg via INTRAVENOUS
  Filled 2013-09-23: qty 2

## 2013-09-23 NOTE — ED Provider Notes (Signed)
CSN: [id number]     Arrival date & time 09/23/13  1525 History   First MD Initiated Contact with Patient 09/23/13 1543     Chief Complaint  Patient presents with  . Flank Pain     (Consider location/radiation/quality/duration/timing/severity/associated sxs/prior Treatment) The history is provided by the patient. No language interpreter was used.  Paul Gates is a 53 year old male with past medical history of hypertension, hyperlipidemia, diabetes, degenerative disc disease, GERD, hypogonadism, history of lumbar fusion/laminectomy presenting to the ED with left-sided flank pain that started approximately 45 minutes prior to arrival to the emergency department. Stated over the past month or so he's been having intermittent pubic discomfort-stated that he's been having urgency but a socially when going to the bathroom. Stated that today he's been having hematuria with each urination. Stated he started to experience a constant piercing sensation is now waxing and waning localized to the left flank. Reported that at times the pain radiates from the left groin to the left flank. Stated he's not taken anything for discomfort. Denied history of kidney stones. Denied pressure, dysuria, swelling, groin pain, groin swelling, bleeding, discharge, fever, chills, nausea, vomiting, chest pain, shortness of breath, difficulty breathing, penile swelling, testicular swelling. PCP Dr. Melford Aase - next appointment 10/09/2013  Past Medical History  Diagnosis Date  . Hypertension   . Hyperlipidemia   . Diabetes mellitus without complication   . DDD (degenerative disc disease)   . Vitamin D deficiency   . Hypogonadism male   . GERD (gastroesophageal reflux disease)    Past Surgical History  Procedure Laterality Date  . Lumbar disc surgery    . 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 Esophagogastroduodenoscopy  [phone]    duodenal ulcer  . Lumbar laminectomy  7/97  . Lumbar fusion  2001  . Cervical  fusion  1991   Family History  Problem Relation Age of Onset  . Hypertension Mother   . Stroke Mother   . Cancer Mother     ovarian  . Alcohol abuse Father   . Hypertension Father   . Diabetes Maternal Uncle   . Cancer Maternal Grandmother     lung  . Cancer Paternal Grandfather     lung  . Colon cancer Neg Hx   . Stomach cancer Neg Hx    History  Substance Use Topics  . Smoking status: Current Every Day Smoker -- 0.75 packs/day for 30 years    Types: Cigarettes  . Smokeless tobacco: Never Used  . Alcohol Use: 1.5 oz/week    3 drink(s) per week    Review of Systems  Constitutional: Negative for fever and chills.  Respiratory: Negative for cough and shortness of breath.   Cardiovascular: Negative for chest pain.  Gastrointestinal: Positive for abdominal pain. Negative for nausea, vomiting, constipation, blood in stool and anal bleeding.  Genitourinary: Positive for urgency, hematuria and flank pain. Negative for dysuria, decreased urine volume, penile swelling, scrotal swelling, penile pain and testicular pain.  Musculoskeletal: Negative for back pain and neck pain.  Neurological: Negative for weakness.      Allergies  Review of patient's allergies indicates no known allergies.  Home Medications   Prior to Admission medications   Medication Sig Start Date End Date Taking? Authorizing Provider  B-D 3CC LUER-LOK SYR 21GX1" 21G X 1" 3 ML MISC USE AS DIRECTED 09/09/13  Yes Vicie Mutters, PA-C  cetirizine (ZYRTEC) 10 MG tablet Take 10 mg by mouth daily.   Yes  Historical Provider, MD  Cholecalciferol (VITAMIN D-3) 5000 UNITS TABS Take 10,000 Units by mouth daily.   Yes Historical Provider, MD  cyclobenzaprine (FLEXERIL) 10 MG tablet Take 10 mg by mouth daily as needed for muscle spasms. Takes prn   Yes Historical Provider, MD  dextroamphetamine (DEXEDRINE SPANSULE) 15 MG 24 hr capsule Take 15 mg by mouth daily as needed.   Yes Historical Provider, MD  levothyroxine  (SYNTHROID) 50 MCG tablet Take 1 tablet (50 mcg total) by mouth daily before breakfast. 04/16/13  Yes [name], MD  metFORMIN (GLUCOPHAGE) 500 MG tablet Take 500 mg by mouth 3 (three) times daily.   Yes Historical Provider, MD  naproxen (NAPROSYN) 500 MG tablet Take 500 mg by mouth 2 (two) times daily with a meal. Takes bid   Yes Historical Provider, MD  ONE TOUCH ULTRA TEST test strip USE TO TEST BLOOD SUGAR 3 TIMES DAILY   Yes Vicie Mutters, PA-C  testosterone cypionate (DEPOTESTOTERONE CYPIONATE) 200 MG/ML injection INJECT 2 ML INTRAMUSCULARLY EVERY 2 WEEKS 04/08/13  Yes Vicie Mutters, PA-C  vitamin C (ASCORBIC ACID) 500 MG tablet Take 500 mg by mouth 3 (three) times daily as needed (immune support).   Yes Historical Provider, MD  ondansetron (ZOFRAN) 4 MG tablet Take 1 tablet (4 mg total) by mouth every 6 (six) hours. 09/23/13   Torence Palmeri, PA-C  oxyCODONE-acetaminophen (PERCOCET/ROXICET) 5-325 MG per tablet Take 1 tablet by mouth every 8 (eight) hours as needed for moderate pain or severe pain. 09/23/13   Shagun Wordell, PA-C   BP 155/80  Pulse 81  Temp(Src) 97.7 F (36.5 C) (Oral)  Resp 20  SpO2 100% Physical Exam  Nursing note and vitals reviewed. Constitutional: He is oriented to person, place, and time. He appears well-developed and well-nourished. No distress.  HENT:  Head: Normocephalic and atraumatic.  Mouth/Throat: Oropharynx is clear and moist. No oropharyngeal exudate.  Eyes: Conjunctivae and EOM are normal. Pupils are equal, round, and reactive to light. Right eye exhibits no discharge. Left eye exhibits no discharge.  Neck: Normal range of motion. Neck supple. No tracheal deviation present.  Cardiovascular: Normal rate, regular rhythm and normal heart sounds.  Exam reveals no friction rub.   No murmur heard. Pulmonary/Chest: Effort normal and breath sounds normal. No respiratory distress. He has no wheezes. He has no rales.  Abdominal: Soft. Bowel sounds are  normal. He exhibits no distension. There is tenderness in the suprapubic area. There is CVA tenderness (left sided). There is no rebound and no guarding.    Negative abdominal distention Abdomen soft upon palpation Suprapubic discomfort upon palpation Positive left-sided CVA tenderness  Genitourinary:  Pelvic exam: Negative swelling, erythema, formation, lesions, sores, deformities identified to the penis/scrotal region. Negative penile swelling identified. Negative discharge. Negative blood in the urethral meatus. Negative tenderness upon palpation to the testicles bilaterally. Negative hydrocele. Negative axial. Negative high riding testicle. Exam chaperoned with tech  Musculoskeletal: Normal range of motion.  Full ROM to upper and lower extremities without difficulty noted, negative ataxia noted.  Lymphadenopathy:    He has no cervical adenopathy.  Neurological: He is alert and oriented to person, place, and time. No cranial nerve deficit. He exhibits normal muscle tone. Coordination normal.  Skin: Skin is warm and dry. No rash noted. He is not diaphoretic. No erythema.  Psychiatric: He has a normal mood and affect. His behavior is normal. Thought content normal.    ED Course  Procedures (including critical care time)  Results for orders  placed during the hospital encounter of 09/23/13  URINALYSIS, ROUTINE W REFLEX MICROSCOPIC      Result Value Ref Range   Color, Urine RED (*) YELLOW   APPearance CLOUDY (*) CLEAR   Specific Gravity, Urine 1.027  1.005 - 1.030   pH 6.0  5.0 - 8.0   Glucose, UA NEGATIVE  NEGATIVE mg/dL   Hgb urine dipstick LARGE (*) NEGATIVE   Bilirubin Urine SMALL (*) NEGATIVE   Ketones, ur NEGATIVE  NEGATIVE mg/dL   Protein, ur 30 (*) NEGATIVE mg/dL   Urobilinogen, UA 1.0  0.0 - 1.0 mg/dL   Nitrite NEGATIVE  NEGATIVE   Leukocytes, UA SMALL (*) NEGATIVE  CBC      Result Value Ref Range   WBC 12.1 (*) 4.0 - 10.5 K/uL   RBC 5.42  4.22 - 5.81 MIL/uL    Hemoglobin 16.3  13.0 - 17.0 g/dL   HCT 47.5  39.0 - 52.0 %   MCV 87.6  78.0 - 100.0 fL   MCH 30.1  26.0 - 34.0 pg   MCHC 34.3  30.0 - 36.0 g/dL   RDW 14.3  11.5 - 15.5 %   Platelets 196  150 - 400 K/uL  BASIC METABOLIC PANEL      Result Value Ref Range   Sodium 139  137 - 147 mEq/L   Potassium 5.0  3.7 - 5.3 mEq/L   Chloride 103  96 - 112 mEq/L   CO2 26  19 - 32 mEq/L   Glucose, Bld 118 (*) 70 - 99 mg/dL   BUN 22  6 - 23 mg/dL   Creatinine, Ser 1.18  0.50 - 1.35 mg/dL   Calcium 9.5  8.4 - 10.5 mg/dL   GFR calc non Af Amer 70 (*) >90 mL/min   GFR calc Af Amer 82 (*) >90 mL/min  URINE MICROSCOPIC-ADD ON      Result Value Ref Range   Squamous Epithelial / LPF RARE  RARE   WBC, UA 3-6  <3 WBC/hpf   RBC / HPF TOO NUMEROUS TO COUNT  <3 RBC/hpf   Bacteria, UA RARE  RARE   Crystals CA OXALATE CRYSTALS (*) NEGATIVE    Labs Review Labs Reviewed  URINALYSIS, ROUTINE W REFLEX MICROSCOPIC - Abnormal; Notable for the following:    Color, Urine RED (*)    APPearance CLOUDY (*)    Hgb urine dipstick LARGE (*)    Bilirubin Urine SMALL (*)    Protein, ur 30 (*)    Leukocytes, UA SMALL (*)    All other components within normal limits  CBC - Abnormal; Notable for the following:    WBC 12.1 (*)    All other components within normal limits  BASIC METABOLIC PANEL - Abnormal; Notable for the following:    Glucose, Bld 118 (*)    GFR calc non Af Amer 70 (*)    GFR calc Af Amer 82 (*)    All other components within normal limits  URINE MICROSCOPIC-ADD ON - Abnormal; Notable for the following:    Crystals CA OXALATE CRYSTALS (*)    All other components within normal limits  URINE CULTURE    Imaging Review Ct Abdomen Pelvis Wo Contrast  09/23/2013   CLINICAL DATA:  Left flank pain.  EXAM: CT ABDOMEN AND PELVIS WITHOUT CONTRAST  TECHNIQUE: Multidetector CT imaging of the abdomen and pelvis was performed following the standard protocol without IV contrast.  COMPARISON:  None.  FINDINGS: The  lung bases are clear.  No pleural effusion or pulmonary nodule. The heart is mildly enlarged. No pericardial effusion. The distal esophagus is grossly normal.  The unenhanced appearance of the liver is unremarkable. No focal hepatic lesions or intrahepatic biliary dilatation. The gallbladder is normal. No common bile duct dilatation. The pancreas is normal. The spleen is normal in size. No focal lesions. The adrenal glands are unremarkable.  There is moderate hydronephrosis of the left kidney and perinephric interstitial changes and fluid consistent with a moderate grade obstruction. There is a proximal left ureteral calculus measuring 5 mm and located near the ureteropelvic junction. No distal ureteral calculi. No right-sided ureteral calculi. No bladder calculi.  The stomach, duodenum, small bowel and colon are grossly normal without oral contrast. The appendix is normal. No mesenteric or retroperitoneal mass or adenopathy. The aorta is normal in caliber. Minimal scattered atherosclerotic calcifications.  The bladder, prostate gland and seminal vesicles are unremarkable. Central prostate gland calcifications are noted. No pelvic mass, adenopathy or free pelvic fluid collections. No inguinal mass or hernia.  The bony structures are unremarkable. Lumbar fusion hardware is noted.  IMPRESSION: 5 mm left UPJ calculus causing moderate grade obstruction.   Electronically Signed   By: Kalman Jewels M.D.   On: 09/23/2013 17:16     EKG Interpretation None      MDM   Final diagnoses:  Nephrolithiasis  Ureteral colic   Medications  morphine 4 MG/ML injection 4 mg (4 mg Intravenous Given 09/23/13 1753)  ondansetron (ZOFRAN) injection 4 mg (4 mg Intravenous Given 09/23/13 1754)    Filed Vitals:   09/23/13 1538 09/23/13 1755  BP: 157/96 155/80  Pulse: 69 81  Temp: 98.3 F (36.8 C) 97.7 F (36.5 C)  TempSrc: Oral Oral  Resp: 20 20  SpO2: 99% 100%    CBC mild elevated white blood cell count of 12.1. BMP  noted properly functioning kidneys-BUN 22, creatinine 1.18. Anion gap of 10.0 mEq per liter. Urinalysis noted urine to be red with large hemoglobin, small leukocytes with white blood cell count of 3-6, red blood cells too numerous to count oxalate crystals identified in the urine. Urine culture pending. CT abdomen and pelvis without contrast identified a 5 mm stone in the left UPJ calculus causing moderate grade obstruction with moderate hydronephrosis the left kidney and perinephric interstitial changes and fluid consistent with a moderate grade obstruction. Doubt DKA. Doubt pyelonephritis. Patient presenting to the ED with nephrolithiasis. Pain medications administered in ED setting-patient responded well. Patient tolerated by mouth fluids without difficulty-negative episodes of emesis while in ED setting. Patient stable, afebrile. Patient not septic appearing. Discharged patient. Discharged patient with pain medications, antibiotics. Referred patient to primary care provider neurologist. Discussed with patient to rest and drink plenty of fluids. Patient was discharged with a strainer. Discussed with patient to closely monitor symptoms and if symptoms are to worsen or change to report back to the ED - strict return instructions given.  Patient agreed to plan of care, understood, all questions answered.     Jamse Mead, PA-C 09/24/13 1146

## 2013-09-23 NOTE — ED Notes (Signed)
Pt given AVS summary and explained thoroughly. Given strainer and given instructions on use and follow up with urology. No other questions at this time. Ambulatory.

## 2013-09-23 NOTE — ED Notes (Signed)
Initial contact-A&Ox4. Reports hematuria x2 starting today. Denies dysuria. No hx kidney stones or BPH. Denies UTI history. Has had prior back surgery so does have chronic minor back pain. Doesn't know if back pain is from kidneys or back. Did have pelvic pain x1 month ago. Had "slow urine flow without pain" at that time. Has follow up 6/18 of this month. Ambulatory.

## 2013-09-23 NOTE — ED Notes (Signed)
Pt able to drink fluids without nausea, vomiting. No issues noted.

## 2013-09-23 NOTE — ED Notes (Signed)
Gave patient urnal and notified him that we need a sample.

## 2013-09-23 NOTE — Discharge Instructions (Signed)
Please call your doctor for a followup appointment within 24-48 hours. When you talk to your doctor please let them know that you were seen in the emergency department and have them acquire all of your records so that they can discuss the findings with you and formulate a treatment plan to fully care for your new and ongoing problems. Please call and set-up an appointment with your primary care provider to be seen and re-assessed by the end of this week Please call and set-up an appointment with Urology regarding kidney stone Please rest and stay hydrated Please drink plenty of fluids Please strain all urine Please take medications as prescribed - while on pain medications there is to be no drinking alcohol, driving, operating any heavy machinery. If there is extra please dispose in a proper manner. Please do not take any extra Tylenol for this can lead to Tylenol overdose and liver issues. Please continue to monitor symptoms closely and if symptoms are to worsen or change (fever greater than 101, chills, chest pain, shortness of breath, difficulty breathing, numbness, tingling, worsening or changes to abdominal pain, blood in the urine, blood clots in the urine, worsening pain, inability to keep food or fluids down, blood in the stools, black tarry stools, weakness, fainting, nausea, vomiting) please report back to the ED immediately   Kidney Stones Kidney stones (urolithiasis) are deposits that form inside your kidneys. The intense pain is caused by the stone moving through the urinary tract. When the stone moves, the ureter goes into spasm around the stone. The stone is usually passed in the urine.  CAUSES   A disorder that makes certain neck glands produce too much parathyroid hormone (primary hyperparathyroidism).  A buildup of uric acid crystals, similar to gout in your joints.  Narrowing (stricture) of the ureter.  A kidney obstruction present at birth (congenital obstruction).  Previous  surgery on the kidney or ureters.  Numerous kidney infections. SYMPTOMS   Feeling sick to your stomach (nauseous).  Throwing up (vomiting).  Blood in the urine (hematuria).  Pain that usually spreads (radiates) to the groin.  Frequency or urgency of urination. DIAGNOSIS   Taking a history and physical exam.  Blood or urine tests.  CT scan.  Occasionally, an examination of the inside of the urinary bladder (cystoscopy) is performed. TREATMENT   Observation.  Increasing your fluid intake.  Extracorporeal shock wave lithotripsy This is a noninvasive procedure that uses shock waves to break up kidney stones.  Surgery may be needed if you have severe pain or persistent obstruction. There are various surgical procedures. Most of the procedures are performed with the use of small instruments. Only small incisions are needed to accommodate these instruments, so recovery time is minimized. The size, location, and chemical composition are all important variables that will determine the proper choice of action for you. Talk to your health care provider to better understand your situation so that you will minimize the risk of injury to yourself and your kidney.  HOME CARE INSTRUCTIONS   Drink enough water and fluids to keep your urine clear or pale yellow. This will help you to pass the stone or stone fragments.  Strain all urine through the provided strainer. Keep all particulate matter and stones for your health care provider to see. The stone causing the pain may be as small as a grain of salt. It is very important to use the strainer each and every time you pass your urine. The collection of your stone  will allow your health care provider to analyze it and verify that a stone has actually passed. The stone analysis will often identify what you can do to reduce the incidence of recurrences.  Only take over-the-counter or prescription medicines for pain, discomfort, or fever as directed  by your health care provider.  Make a follow-up appointment with your health care provider as directed.  Get follow-up X-rays if required. The absence of pain does not always mean that the stone has passed. It may have only stopped moving. If the urine remains completely obstructed, it can cause loss of kidney function or even complete destruction of the kidney. It is your responsibility to make sure X-rays and follow-ups are completed. Ultrasounds of the kidney can show blockages and the status of the kidney. Ultrasounds are not associated with any radiation and can be performed easily in a matter of minutes. SEEK MEDICAL CARE IF:  You experience pain that is progressive and unresponsive to any pain medicine you have been prescribed. SEEK IMMEDIATE MEDICAL CARE IF:   Pain cannot be controlled with the prescribed medicine.  You have a fever or shaking chills.  The severity or intensity of pain increases over 18 hours and is not relieved by pain medicine.  You develop a new onset of abdominal pain.  You feel faint or pass out.  You are unable to urinate. MAKE SURE YOU:   Understand these instructions.  Will watch your condition.  Will get help right away if you are not doing well or get worse. Document Released: 04/10/2005 Document Revised: 12/11/2012 Document Reviewed: 09/11/2012 [facility] Patient Information 2014 [place].  Diet for Kidney Stones Kidney stones are small, hard masses that form inside your kidneys. They are made up of salts and minerals and often form when high levels build up in the urine. The minerals can then start to build up, crystalize, and stick together to form stones. There are several different types of kidney stones. The following types of stones may be influenced by dietary factors:   Calcium Oxalate Stones. An oxalate is a salt found in certain foods. Within the body, calcium can combine with oxalates to form calcium oxalate stones, which can be  excreted in the urine in high amounts. This is the most common type of kidney stone.  Calcium Phosphate Stones. These stones may occur when the pH of the urine becomes too high, or less acidic, from too much calcium being excreted in the urine. The pH is a measure of how acidic or basic a substance is.  Uric Acid Stones. This type of stone occurs when the pH of the urine becomes too low, or very acidic, because substances called purines build up in the urine. Purines are found in animal proteins. When the urine is highly concentrated with acid, uric acid kidney stones can form.  Other risk factors for kidney stones include genetics, environment, and being overweight. Your caregiver may ask you to follow specific diet guidelines based on the type of stone you have to lessen the chances of your body making more kidney stones.  GENERAL GUIDELINES FOR ALL TYPES OF STONES  Drink plenty of fluid. Drink 12 16 cups of fluid a day, drinking mainly water.This is the most important thing you can do to prevent the formation of future kidney stones.  Maintain a healthy weight. Your caregiver or dietitian can help you determine what a healthy weight is for you. If you are overweight, weight loss may help prevent the formation  of future kidney stones.  Eat a diet adequate in animal protein. Too much animal protein can contribute to the formation of stones. Your dietitian can help you determine how much protein you should be eating. Avoid low carbohydrate, high protein diets.  Follow a balanced eating approach. The DASH diet, which stands for "Dietary Approaches to Stop Hypertension," is an effective meal plan for reducing stone formation. This diet is high in fruits, vegetables, dairy, and whole grains and low in animal protein. Ask your caregiver or dietitian for information about the DASH diet. ADDITIONAL DIET GUIDELINES FOR CALCIUM STONES Avoid foods high in salt. This includes table salt, salt seasonings,  MSG, soy sauce, cured and processed meats, salted crackers and snack foods, fast food, and canned soups and foods. Ask your caregiver or dietitian for information about reducing sodium in your diet or following the low sodium diet.  Ensure adequate calcium intake. Use the following table for calcium guidelines:  Men 73 years old and younger  1000 mg/day.  Men 65 years old and older  1500 mg/day.  Women 51 51 years old  1000 mg/day.  Women 50 years and older  1500 mg/day. Your dietitian can help you determine if you are getting enough calcium in your diet. Foods that are high in calcium include dairy products, broccoli, cheese, yogurt, and pudding. If you need to take a calcium supplement, take it only in the form of calcium citrate.  Avoid foods high in oxalate. Be sure that any supplements you take do not contain more than 500 mg of vitamin C. Vitamin C is converted into oxalate in the body. You do not need to avoid fruits and vegetables high in vitamin C.   Grains: High-fiber or bran cereal, whole-wheat bread, grits, barley, buckwheat, amaranth, pretzels, and fruitcake.  Vegetables: Dried beans, wax beans, dark leafy greens, eggplant, leeks, okra, parsley, rutabaga, tomato paste, watercress, zucchini, and escarole.  Fruit: Dried apricots, red currants, figs, kiwi, and rhubarb.  Meat and Meat Substitutes: Soybeans and foods made from soy (soyburger, miso), dried beans, peanut butter.  Milk: Chocolate milk mixes and soymilk.  Fats and Oils: Nuts (peanuts, almonds, pecans, cashews, hazelnuts) and nut butters, sesame seeds, and tDahini paste.  Condiments/Miscellaneous: Chocolate, carob, marmalade, poppy seeds, instant iced tea, and juice from high-oxalate fruits.  Document Released: 08/05/2010 Document Revised: 10/10/2011 Document Reviewed: 09/25/2011 [facility] Patient Information 2014 [place].

## 2013-09-23 NOTE — ED Notes (Signed)
Pt reports L groin and L flank pain today with hematuria.  Pt reports urinary urgency with retention.  Denies hx of kidney stones at this time.

## 2013-09-25 LAB — URINE CULTURE
CULTURE: NO GROWTH
Colony Count: NO GROWTH
Special Requests: NORMAL

## 2013-09-25 NOTE — ED Provider Notes (Signed)
Medical screening examination/treatment/procedure(s) were performed by non-physician practitioner and as supervising physician I was immediately available for consultation/collaboration.   EKG Interpretation None        Lorne Winkels F Torin Modica, MD 09/25/13 0137 

## 2013-10-09 ENCOUNTER — Ambulatory Visit (INDEPENDENT_AMBULATORY_CARE_PROVIDER_SITE_OTHER): Payer: 59 | Admitting: Internal Medicine

## 2013-10-09 ENCOUNTER — Encounter: Payer: Self-pay | Admitting: Internal Medicine

## 2013-10-09 VITALS — BP 154/84 | HR 72 | Temp 97.9°F | Resp 16 | Ht 70.0 in | Wt 192.2 lb

## 2013-10-09 DIAGNOSIS — E559 Vitamin D deficiency, unspecified: Secondary | ICD-10-CM

## 2013-10-09 DIAGNOSIS — F32A Depression, unspecified: Secondary | ICD-10-CM

## 2013-10-09 DIAGNOSIS — E1129 Type 2 diabetes mellitus with other diabetic kidney complication: Secondary | ICD-10-CM

## 2013-10-09 DIAGNOSIS — F988 Other specified behavioral and emotional disorders with onset usually occurring in childhood and adolescence: Secondary | ICD-10-CM | POA: Insufficient documentation

## 2013-10-09 DIAGNOSIS — R7402 Elevation of levels of lactic acid dehydrogenase (LDH): Secondary | ICD-10-CM

## 2013-10-09 DIAGNOSIS — Z113 Encounter for screening for infections with a predominantly sexual mode of transmission: Secondary | ICD-10-CM

## 2013-10-09 DIAGNOSIS — R740 Nonspecific elevation of levels of transaminase and lactic acid dehydrogenase [LDH]: Secondary | ICD-10-CM

## 2013-10-09 DIAGNOSIS — Z125 Encounter for screening for malignant neoplasm of prostate: Secondary | ICD-10-CM

## 2013-10-09 DIAGNOSIS — Z111 Encounter for screening for respiratory tuberculosis: Secondary | ICD-10-CM

## 2013-10-09 DIAGNOSIS — I10 Essential (primary) hypertension: Secondary | ICD-10-CM

## 2013-10-09 DIAGNOSIS — Z1212 Encounter for screening for malignant neoplasm of rectum: Secondary | ICD-10-CM

## 2013-10-09 DIAGNOSIS — F329 Major depressive disorder, single episode, unspecified: Secondary | ICD-10-CM

## 2013-10-09 DIAGNOSIS — Z0000 Encounter for general adult medical examination without abnormal findings: Secondary | ICD-10-CM

## 2013-10-09 DIAGNOSIS — Z79899 Other long term (current) drug therapy: Secondary | ICD-10-CM | POA: Insufficient documentation

## 2013-10-09 DIAGNOSIS — R7401 Elevation of levels of liver transaminase levels: Secondary | ICD-10-CM

## 2013-10-09 HISTORY — DX: Depression, unspecified: F32.A

## 2013-10-09 LAB — CBC WITH DIFFERENTIAL/PLATELET
BASOS ABS: 0 10*3/uL (ref 0.0–0.1)
BASOS PCT: 0 % (ref 0–1)
EOS ABS: 0.2 10*3/uL (ref 0.0–0.7)
Eosinophils Relative: 1 % (ref 0–5)
HCT: 48.1 % (ref 39.0–52.0)
Hemoglobin: 16.5 g/dL (ref 13.0–17.0)
LYMPHS PCT: 31 % (ref 12–46)
Lymphs Abs: 4.7 10*3/uL — ABNORMAL HIGH (ref 0.7–4.0)
MCH: 30.3 pg (ref 26.0–34.0)
MCHC: 34.3 g/dL (ref 30.0–36.0)
MCV: 88.3 fL (ref 78.0–100.0)
Monocytes Absolute: 1.1 10*3/uL — ABNORMAL HIGH (ref 0.1–1.0)
Monocytes Relative: 7 % (ref 3–12)
Neutro Abs: 9.3 10*3/uL — ABNORMAL HIGH (ref 1.7–7.7)
Neutrophils Relative %: 61 % (ref 43–77)
PLATELETS: 250 10*3/uL (ref 150–400)
RBC: 5.45 MIL/uL (ref 4.22–5.81)
RDW: 14.5 % (ref 11.5–15.5)
WBC: 15.2 10*3/uL — AB (ref 4.0–10.5)

## 2013-10-09 NOTE — Progress Notes (Signed)
Patient ID: Paul Gates, male   DOB: 14-Aug-1960, 53 y.o.   MRN: [id number]   Annual Screening Comprehensive Examination  This very nice 53 y.o.SWM presents for complete physical.  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 Patient is followed by Dr Toy Care for Depression & ADD.   Labile HTN predates many years with expectent monitoring.. Patient's BP has been controlled at home.Today's BP: 154/84 mmHg. Patient denies any cardiac symptoms as chest pain, palpitations, shortness of breath, dizziness or ankle swelling.   Patient's hyperlipidemia is controlled with diet. Patient denies myalgias or other medication SE's. Last Lipids were at goal in March 2015 as below.  Lab Results  Component Value Date   CHOL 86 07/17/2013   HDL 27* 07/17/2013   LDLCALC 22 07/17/2013   TRIG 184* 07/17/2013   CHOLHDL 3.2 07/17/2013    Patient has T2_NIDDM since Sept 2013 with A1c 6.4% and has Stage 2 CKD (GFR 82 ml/min). His last A1c was 6.0% in Mar 2015. Patient denies reactive hypoglycemic symptoms, visual blurring, diabetic polys, or paresthesias.   Finally, patient has history of Vitamin D Deficiency of 33 in 05/2012  and last vitamin D was 96 in Mar 2015.  Medication Sig  . B-D 3CC LUER-LOK SYR 21GX1" 21G X 1" 3 ML MISC USE AS DIRECTED  . cetirizine 10 MG tablet Take 10 mg by mouth daily.  Marland Kitchen VITAMIN D 5000 UNITS TABS Take 10,000 Units by mouth daily.  . cyclobenzaprine 10 MG tablet Take 10 mg by mouth daily as needed for muscle spasms. T  . DEXEDRINE SPANSULE 15 MG 24 hr capsule Take 15 mg by mouth daily as needed.  Marland Kitchen levothyroxine  50 MCG tablet Take 1 tablet (50 mcg total) by mouth daily before breakfast.  . metFORMIN  500 MG XR tablet Take 500 mg by mouth 3 (three) times daily.  . naproxen ( 500 MG tablet Take 500 mg by mouth 2 (two) times daily with a meal. Takes bid  . ondansetron (ZOFRAN) 4 MG tablet Take 1 tablet (4 mg total) by mouth every 6  (six) hours.  . ONE TOUCH ULTRA TEST test strip USE TO TEST BLOOD SUGAR 3 TIMES DAILY  . oxyCODONE-acetaminophen (PERCOCET/ROXICET) 5-325 MG per tablet Take 1 tablet by mouth every 8 (eight) hours as needed for moderate pain or severe pain.  . DEPOTESTOTERONE CYPI 200 MG/ML  INJECT 2 ML INTRAMUSCULARLY EVERY 2 WEEKS  . vitamin C  500 MG tablet Take 500 mg by mouth 3 (three) times daily as needed (immune support).   No Known Allergies  Past Medical History  Diagnosis Date  . Hypertension   . Hyperlipidemia   . Diabetes mellitus without complication   . DDD (degenerative disc disease)   . Vitamin D deficiency   . Hypogonadism male   . GERD (gastroesophageal reflux disease)    Past Surgical History  Procedure Laterality Date  . Lumbar disc surgery    . 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 Esophagogastroduodenoscopy  [phone]    duodenal ulcer  . Lumbar laminectomy  7/97  . Lumbar fusion  2001  . Cervical fusion  1991   Family History  Problem Relation Age of Onset  . Hypertension Mother   . Stroke Mother   . Cancer Mother     ovarian  . Alcohol abuse Father   . Hypertension Father   . Diabetes Maternal Uncle   .  Cancer Maternal Grandmother     lung  . Cancer Paternal Grandfather     lung  . Colon cancer Neg Hx   . Stomach cancer Neg Hx    History   Social History  . Marital Status: Single    Spouse Name: N/A    Number of Children: N/A  . Years of Education: N/A   Occupational History  . 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   Social History Main Topics  . Smoking status: Current Every Day Smoker -- 0.75 packs/day for 30 years    Types: Cigarettes  . Smokeless tobacco: Never Used  . Alcohol Use: 1.5 oz/week    3 drink(s) per week  . Drug Use: No  . Sexual Activity: Not on file   Other Topics Concern  . 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     ROS Constitutional: Denies fever, chills, weight loss/gain, headaches,  insomnia, fatigue, night sweats or change in appetite. Eyes: Denies redness, blurred vision, diplopia, discharge, itchy or watery eyes.  ENT: Denies discharge, congestion, post nasal drip, epistaxis, sore throat, earache, hearing loss, dental pain, Tinnitus, Vertigo, Sinus pain or snoring.  Cardio: Denies chest pain, palpitations, irregular heartbeat, syncope, dyspnea, diaphoresis, orthopnea, PND, claudication or edema Respiratory: denies cough, dyspnea, DOE, pleurisy, hoarseness, laryngitis or wheezing.  Gastrointestinal: Denies dysphagia, heartburn, reflux, water brash, pain, cramps, nausea, vomiting, bloating, diarrhea, constipation, hematemesis, melena, hematochezia, jaundice or hemorrhoids Genitourinary: Denies dysuria, frequency, urgency, nocturia, hesitancy, discharge, hematuria or flank pain Musculoskeletal: Denies arthralgia, myalgia, stiffness, Jt. Swelling, pain, limp or strain/sprain. Skin: Denies puritis, rash, hives, warts, acne, eczema or change in skin lesion Neuro: No weakness, tremor, incoordination, spasms, paresthesia or pain Psychiatric: Denies confusion, memory loss or sensory loss Endocrine: Denies change in weight, skin, hair change, nocturia, and paresthesia, diabetic polys, visual blurring or hyper / hypo glycemic episodes.  Heme/Lymph: No excessive bleeding, bruising or enlarged lymph nodes.  Physical Exam  BP 154/84  Pulse 72  Temp 97.9 F   Resp 16  Ht 5\' 10"    Wt 192 lb 3.2 oz   BMI 27.58 kg/m2  General Appearance: Well nourished, in no apparent distress. Eyes: PERRLA, EOMs, conjunctiva no swelling or erythema, normal fundi and vessels. Sinuses: No frontal/maxillary tenderness ENT/Mouth: EACs patent / TMs  nl. Nares clear without erythema, swelling, mucoid exudates. Oral hygiene is good. No erythema, swelling, or exudate. Tongue normal, non-obstructing. Tonsils not swollen or erythematous. Hearing normal.  Neck: Supple, thyroid normal. No bruits, nodes or  JVD. Respiratory: Respiratory effort normal.  BS equal and clear bilateral without rales, rhonci, wheezing or stridor. Cardio: Heart sounds are normal with regular rate and rhythm and no murmurs, rubs or gallops. Peripheral pulses are normal and equal bilaterally without edema. No aortic or femoral bruits. Chest: symmetric with normal excursions and percussion.  Abdomen: Flat, soft, with bowl sounds. Nontender, no guarding, rebound, hernias, masses, or organomegaly.  Lymphatics: Non tender without lymphadenopathy.  Genitourinary: No hernias.Testes nl. DRE - prostate nl for age - smooth & firm w/o nodules. Musculoskeletal: Full ROM all peripheral extremities, joint stability, 5/5 strength, and normal gait. Skin: Warm and dry without rashes, lesions, cyanosis, clubbing or  ecchymosis.  Neuro: Cranial nerves intact, reflexes equal bilaterally. Normal muscle tone, no cerebellar symptoms. Sensation intact.  Pysch: Awake and oriented X 3, normal affect, insight and judgment appropriate.   Assessment and Plan  1. Annual Screening Examination 2. Hypertension  3. Hyperlipidemia 4. Pre Diabetes 5. Vitamin D  Deficiency  Continue prudent diet as discussed, weight control, BP monitoring, regular exercise, and medications as discussed.  Discussed med effects and SE's. Routine screening labs and tests as requested with regular follow-up as recommended.

## 2013-10-09 NOTE — Patient Instructions (Signed)
Recommend the book "the END of DIETING" by Dr Joel Furman   Get the  Book "The END of DIABETES " by Dr Joel Fuhrman  At [employer].com - get book & Audio CD's      Being diabetic has a  300% increased risk for heart attack, stroke, cancer, and alzheimer- type vascular dementia. It is very important that you work harder with diet by avoiding all foods that are white except chicken & fish. Avoid white rice (brown & wild rice is OK), white potatoes (sweetpotatoes in moderation is OK), White bread or wheat bread or anything made out of white flour like bagels, donuts, rolls, buns, biscuits, cakes, pastries, cookies, pizza crust, and pasta (made from white flour & egg whites) - vegetarian pasta or spinach or wheat pasta is OK. Multigrain breads like Arnold's or Pepperidge [employer], or multigrain sandwich thins or flatbreads.  Diet, exercise and weight loss can reverse and cure diabetes in the early stages.  Diet, exercise and weight loss is very important in the control and prevention of complications of diabetes which affects every system in your body, ie. Brain - dementia/stroke, eyes - glaucoma/blindness, heart - heart attack/heart failure, kidneys - dialysis, stomach - gastric paralysis, intestines - malabsorption, nerves - severe painful neuritis, circulation - gangrene & loss of a leg(s), and finally cancer and Alzheimers.    I recommend avoid fried & greasy foods,  sweets/candy, white rice (brown or wild rice or Quinoa is OK), white potatoes (sweet potatoes are OK) - anything made from white flour - bagels, doughnuts, rolls, buns, biscuits,white and wheat breads, pizza crust and traditional pasta made of white flour & egg white(vegetarian pasta or spinach or wheat pasta is OK).  Multi-grain bread is OK - like multi-grain flat bread or sandwich thins. Avoid alcohol in excess. Exercise is also important.    Eat all the vegetables you want - avoid meat, especially red meat and dairy - especially cheese.  Cheese is  the most concentrated form of trans-fats which is the worst thing to clog up our arteries. Veggie cheese is OK which can be found in the fresh produce section at [employer] or [employer] or [employer]   

## 2013-10-10 LAB — LIPID PANEL
CHOLESTEROL: 93 mg/dL (ref 0–200)
HDL: 24 mg/dL — AB (ref >39)
LDL CALC: 22 mg/dL (ref 0–99)
Total CHOL/HDL Ratio: 3.9 Ratio
Triglycerides: 234 mg/dL — ABNORMAL HIGH (ref <150)
VLDL: 47 mg/dL — ABNORMAL HIGH (ref 0–40)

## 2013-10-10 LAB — HEPATIC FUNCTION PANEL
ALBUMIN: 4.4 g/dL (ref 3.5–5.2)
ALT: 27 U/L (ref 0–53)
AST: 26 U/L (ref 0–37)
Alkaline Phosphatase: 68 U/L (ref 39–117)
BILIRUBIN TOTAL: 0.5 mg/dL (ref 0.2–1.2)
Bilirubin, Direct: 0.1 mg/dL (ref 0.0–0.3)
Indirect Bilirubin: 0.4 mg/dL (ref 0.2–1.2)
TOTAL PROTEIN: 7 g/dL (ref 6.0–8.3)

## 2013-10-10 LAB — BASIC METABOLIC PANEL WITH GFR
BUN: 20 mg/dL (ref 6–23)
CALCIUM: 9.4 mg/dL (ref 8.4–10.5)
CHLORIDE: 96 meq/L (ref 96–112)
CO2: 29 mEq/L (ref 19–32)
CREATININE: 1.08 mg/dL (ref 0.50–1.35)
GFR, EST NON AFRICAN AMERICAN: 80 mL/min
Glucose, Bld: 72 mg/dL (ref 70–99)
Potassium: 4 mEq/L (ref 3.5–5.3)
Sodium: 133 mEq/L — ABNORMAL LOW (ref 135–145)

## 2013-10-10 LAB — URINALYSIS, MICROSCOPIC ONLY
BACTERIA UA: NONE SEEN
Casts: NONE SEEN
Squamous Epithelial / LPF: NONE SEEN

## 2013-10-10 LAB — MICROALBUMIN / CREATININE URINE RATIO
CREATININE, URINE: 225.4 mg/dL
MICROALB/CREAT RATIO: 10.3 mg/g (ref 0.0–30.0)
Microalb, Ur: 2.32 mg/dL — ABNORMAL HIGH (ref 0.00–1.89)

## 2013-10-10 LAB — VITAMIN D 25 HYDROXY (VIT D DEFICIENCY, FRACTURES): Vit D, 25-Hydroxy: 115 ng/mL — ABNORMAL HIGH (ref 30–89)

## 2013-10-10 LAB — VITAMIN B12: Vitamin B-12: 1114 pg/mL — ABNORMAL HIGH (ref 211–911)

## 2013-10-10 LAB — HIV ANTIBODY (ROUTINE TESTING W REFLEX): HIV 1&2 Ab, 4th Generation: NONREACTIVE

## 2013-10-10 LAB — HEPATITIS B SURFACE ANTIBODY,QUALITATIVE: HEP B S AB: POSITIVE — AB

## 2013-10-10 LAB — HEPATITIS A ANTIBODY, TOTAL: HEP A TOTAL AB: NONREACTIVE

## 2013-10-10 LAB — HEMOGLOBIN A1C
Hgb A1c MFr Bld: 6 % — ABNORMAL HIGH (ref <5.7)
MEAN PLASMA GLUCOSE: 126 mg/dL — AB (ref <117)

## 2013-10-10 LAB — MAGNESIUM: Magnesium: 1.9 mg/dL (ref 1.5–2.5)

## 2013-10-10 LAB — TSH: TSH: 3.075 u[IU]/mL (ref 0.350–4.500)

## 2013-10-10 LAB — HEPATITIS C ANTIBODY: HCV AB: NEGATIVE

## 2013-10-10 LAB — HEPATITIS B CORE ANTIBODY, TOTAL: Hep B Core Total Ab: NONREACTIVE

## 2013-10-10 LAB — INSULIN, FASTING: INSULIN FASTING, SERUM: 8 u[IU]/mL (ref 3–28)

## 2013-10-10 LAB — PSA: PSA: 0.82 ng/mL (ref <=4.00)

## 2013-10-10 LAB — RPR

## 2013-10-10 LAB — TESTOSTERONE: Testosterone: 1027.91 ng/dL — ABNORMAL HIGH (ref 300–890)

## 2013-10-13 ENCOUNTER — Encounter: Payer: Self-pay | Admitting: Internal Medicine

## 2013-10-13 LAB — HEPATITIS B E ANTIBODY: HEPATITIS BE ANTIBODY: NONREACTIVE

## 2013-10-15 LAB — TB SKIN TEST
Induration: 0 mm
TB Skin Test: NEGATIVE

## 2013-10-20 ENCOUNTER — Ambulatory Visit: Payer: Self-pay | Admitting: Physician Assistant

## 2013-10-23 ENCOUNTER — Other Ambulatory Visit (INDEPENDENT_AMBULATORY_CARE_PROVIDER_SITE_OTHER): Payer: 59

## 2013-10-23 DIAGNOSIS — Z1212 Encounter for screening for malignant neoplasm of rectum: Secondary | ICD-10-CM

## 2013-10-23 LAB — POC HEMOCCULT BLD/STL (HOME/3-CARD/SCREEN)
Card #3 Fecal Occult Blood, POC: NEGATIVE
FECAL OCCULT BLD: NEGATIVE
Fecal Occult Blood, POC: NEGATIVE

## 2013-10-24 ENCOUNTER — Other Ambulatory Visit: Payer: Self-pay | Admitting: Internal Medicine

## 2013-10-27 ENCOUNTER — Encounter: Payer: Self-pay | Admitting: Internal Medicine

## 2013-10-27 ENCOUNTER — Ambulatory Visit (INDEPENDENT_AMBULATORY_CARE_PROVIDER_SITE_OTHER): Payer: 59 | Admitting: Internal Medicine

## 2013-10-27 VITALS — BP 134/74 | HR 64 | Temp 99.1°F | Resp 16 | Ht 70.25 in | Wt 183.8 lb

## 2013-10-27 DIAGNOSIS — K5900 Constipation, unspecified: Secondary | ICD-10-CM

## 2013-10-27 NOTE — Patient Instructions (Signed)
Try Amitiza 24 mcg 2 x daily  Can also try Miralax  (polyethylene glycol)    Constipation Constipation is when a person has fewer than three bowel movements a week, has difficulty having a bowel movement, or has stools that are dry, hard, or larger than normal. As people grow older, constipation is more common. If you try to fix constipation with medicines that make you have a bowel movement (laxatives), the problem may get worse. Long-term laxative use may cause the muscles of the colon to become weak. A low-fiber diet, not taking in enough fluids, and taking certain medicines may make constipation worse.  CAUSES   Certain medicines, such as antidepressants, pain medicine, iron supplements, antacids, and water pills.   Certain diseases, such as diabetes, irritable bowel syndrome (IBS), thyroid disease, or depression.   Not drinking enough water.   Not eating enough fiber-rich foods.   Stress or travel.   Lack of physical activity or exercise.   Ignoring the urge to have a bowel movement.   Using laxatives too much.  SIGNS AND SYMPTOMS   Having fewer than three bowel movements a week.   Straining to have a bowel movement.   Having stools that are hard, dry, or larger than normal.   Feeling full or bloated.   Pain in the lower abdomen.   Not feeling relief after having a bowel movement.  DIAGNOSIS  Your health care provider will take a medical history and perform a physical exam. Further testing may be done for severe constipation. Some tests may include:  A barium enema X-ray to examine your rectum, colon, and, sometimes, your small intestine.   A sigmoidoscopy to examine your lower colon.   A colonoscopy to examine your entire colon. TREATMENT  Treatment will depend on the severity of your constipation and what is causing it. Some dietary treatments include drinking more fluids and eating more fiber-rich foods. Lifestyle treatments may include regular  exercise. If these diet and lifestyle recommendations do not help, your health care provider may recommend taking over-the-counter laxative medicines to help you have bowel movements. Prescription medicines may be prescribed if over-the-counter medicines do not work.  HOME CARE INSTRUCTIONS   Eat foods that have a lot of fiber, such as fruits, vegetables, whole grains, and beans.  Limit foods high in fat and processed sugars, such as french fries, hamburgers, cookies, candies, and soda.   A fiber supplement may be added to your diet if you cannot get enough fiber from foods.   Drink enough fluids to keep your urine clear or pale yellow.   Exercise regularly or as directed by your health care provider.   Go to the restroom when you have the urge to go. Do not hold it.   Only take over-the-counter or prescription medicines as directed by your health care provider. Do not take other medicines for constipation without talking to your health care provider first.  Page IF:   You have bright red blood in your stool.   Your constipation lasts for more than 4 days or gets worse.   You have abdominal or rectal pain.   You have thin, pencil-like stools.   You have unexplained weight loss. MAKE SURE YOU:   Understand these instructions.  Will watch your condition.  Will get help right away if you are not doing well or get worse. Document Released: 01/07/2004 Document Revised: 04/15/2013 Document Reviewed: 01/20/2013 [facility] Patient Information 2015 [place], [place]. This information is not  intended to replace advice given to you by your health care provider. Make sure you discuss any questions you have with your health care provider.

## 2013-10-27 NOTE — Progress Notes (Signed)
Subjective:    Patient ID: Paul Gates, male    DOB: Sep 12, 1960, 53 y.o.   MRN: [id number]  HPI  Patient recently developed constipation after taking Oxycodone for a kidney stone about 2 weeks ago. He initially took metamucil with some partial relief, but lower mid abdominal pain persists w/o N?V or diarrhea or change in stools as melena or hematochezia. Current Outpatient Prescriptions on File Prior to Visit  Medication Sig Dispense Refill  . B-D 3CC LUER-LOK SYR 21GX1" 21G X 1" 3 ML MISC USE AS DIRECTED  20 each  0  . cetirizine (ZYRTEC) 10 MG tablet Take 10 mg by mouth daily.      . Cholecalciferol (VITAMIN D-3) 5000 UNITS TABS Take 10,000 Units by mouth daily.      . cyclobenzaprine (FLEXERIL) 10 MG tablet Take 10 mg by mouth daily as needed for muscle spasms. Takes prn      . dextroamphetamine (DEXEDRINE SPANSULE) 15 MG 24 hr capsule Take 15 mg by mouth daily as needed.      Marland Kitchen levothyroxine (SYNTHROID) 50 MCG tablet Take 1 tablet (50 mcg total) by mouth daily before breakfast.  90 tablet  99  . metFORMIN (GLUCOPHAGE-XR) 500 MG 24 hr tablet TAKE 2 TABLETS TWICE A DAY FOR DIABETES  360 tablet  2  . naproxen (NAPROSYN) 500 MG tablet Take 500 mg by mouth 2 (two) times daily with a meal. Takes bid      . ONE TOUCH ULTRA TEST test strip USE TO TEST BLOOD SUGAR 3 TIMES DAILY  100 each  3  . testosterone cypionate (DEPOTESTOTERONE CYPIONATE) 200 MG/ML injection INJECT 2 ML INTRAMUSCULARLY EVERY 2 WEEKS  10 mL  2  . vitamin C (ASCORBIC ACID) 500 MG tablet Take 500 mg by mouth 3 (three) times daily as needed (immune support).      . ondansetron (ZOFRAN) 4 MG tablet Take 1 tablet (4 mg total) by mouth every 6 (six) hours.  12 tablet  0  . oxyCODONE-acetaminophen (PERCOCET/ROXICET) 5-325 MG per tablet Take 1 tablet by mouth every 8 (eight) hours as needed for moderate pain or severe pain.  15 tablet  0   No current facility-administered medications on file prior to visit.   No Known  Allergies Past Medical History  Diagnosis Date  . Hypertension   . Hyperlipidemia   . Diabetes mellitus without complication   . DDD (degenerative disc disease)   . Vitamin D deficiency   . Hypogonadism male   . GERD (gastroesophageal reflux disease)    Review of Systems In addition to the HPI above,  No Fever-chills,  No Headache, No changes with Vision or hearing,  No problems swallowing food or Liquids,  No Chest pain or productive Cough or Shortness of Breath,  No Abdominal pain, No Nausea or Vomitting, Bowel movements are regular,  No Blood in stool or Urine,  No dysuria,  No new skin rashes or bruises,  No new joints pains-aches,  No new weakness, tingling, numbness in any extremity,  No recent weight loss,  No polyuria, polydypsia or polyphagia,  No significant Mental Stressors.  A full 10 point Review of Systems was done, except as stated above, all other Review of Systems were negative  Objective:   Physical Exam BP 134/74  P64  T 99.1 F   Resp 16  Ht 5' 10.25"   Wt 183 lb 12.8 oz   BMI 26.20 kg/m2  HEENT - Eac's patent. TM's Nl. EOM's full.  PERRLA. NasoOroPharynx clear. Neck - supple. Nl Thyroid. Carotids 2+ & No bruits, nodes, JVD Chest - Clear equal BS w/o Rales, rhonchi, wheezes. Cor - Nl HS. RRR w/o sig MGR. PP 1(+). No edema. Abd - Soft with no palpable organomegaly or masses , but there is bilat. Mid abdominal  Tenderness w/o guarding or rebound. BS nl. MS- FROM w/o deformities. Muscle power, tone and bulk Nl. Gait Nl. Neuro - No obvious Cr N abnormalities. Sensory, motor and Cerebellar functions appear Nl w/o focal abnormalities. Psyche - Mental status normal & appropriate.  No delusions, ideations or obvious mood abnormalities.  Assessment & Plan:   1. Unspecified constipation  - Patient given Sx's of Amitiza  #16 to take bid and also encouraged to try Miralax or equivalent til has relief of Sx's of liquid BM.  -ROV - prn

## 2013-11-12 ENCOUNTER — Other Ambulatory Visit: Payer: Self-pay | Admitting: Physician Assistant

## 2014-01-09 ENCOUNTER — Other Ambulatory Visit: Payer: Self-pay | Admitting: Emergency Medicine

## 2014-01-14 ENCOUNTER — Encounter: Payer: Self-pay | Admitting: Physician Assistant

## 2014-01-14 ENCOUNTER — Ambulatory Visit: Payer: Self-pay | Admitting: Emergency Medicine

## 2014-01-14 ENCOUNTER — Ambulatory Visit (INDEPENDENT_AMBULATORY_CARE_PROVIDER_SITE_OTHER): Payer: 59 | Admitting: Physician Assistant

## 2014-01-14 VITALS — BP 128/70 | HR 72 | Temp 97.7°F | Resp 16 | Ht 70.0 in | Wt 191.0 lb

## 2014-01-14 DIAGNOSIS — E349 Endocrine disorder, unspecified: Secondary | ICD-10-CM

## 2014-01-14 DIAGNOSIS — E291 Testicular hypofunction: Secondary | ICD-10-CM

## 2014-01-14 DIAGNOSIS — E782 Mixed hyperlipidemia: Secondary | ICD-10-CM

## 2014-01-14 DIAGNOSIS — E1129 Type 2 diabetes mellitus with other diabetic kidney complication: Secondary | ICD-10-CM

## 2014-01-14 DIAGNOSIS — M5137 Other intervertebral disc degeneration, lumbosacral region: Secondary | ICD-10-CM

## 2014-01-14 DIAGNOSIS — F988 Other specified behavioral and emotional disorders with onset usually occurring in childhood and adolescence: Secondary | ICD-10-CM

## 2014-01-14 DIAGNOSIS — I10 Essential (primary) hypertension: Secondary | ICD-10-CM

## 2014-01-14 DIAGNOSIS — M5136 Other intervertebral disc degeneration, lumbar region: Secondary | ICD-10-CM

## 2014-01-14 DIAGNOSIS — F32A Depression, unspecified: Secondary | ICD-10-CM

## 2014-01-14 DIAGNOSIS — F329 Major depressive disorder, single episode, unspecified: Secondary | ICD-10-CM

## 2014-01-14 DIAGNOSIS — F3289 Other specified depressive episodes: Secondary | ICD-10-CM

## 2014-01-14 DIAGNOSIS — Z79899 Other long term (current) drug therapy: Secondary | ICD-10-CM

## 2014-01-14 DIAGNOSIS — E559 Vitamin D deficiency, unspecified: Secondary | ICD-10-CM

## 2014-01-14 LAB — CBC WITH DIFFERENTIAL/PLATELET
Basophils Absolute: 0 10*3/uL (ref 0.0–0.1)
Basophils Relative: 0 % (ref 0–1)
Eosinophils Absolute: 0.1 10*3/uL (ref 0.0–0.7)
Eosinophils Relative: 1 % (ref 0–5)
HCT: 47.6 % (ref 39.0–52.0)
HEMOGLOBIN: 16.3 g/dL (ref 13.0–17.0)
LYMPHS ABS: 4.6 10*3/uL — AB (ref 0.7–4.0)
LYMPHS PCT: 37 % (ref 12–46)
MCH: 30.5 pg (ref 26.0–34.0)
MCHC: 34.2 g/dL (ref 30.0–36.0)
MCV: 89 fL (ref 78.0–100.0)
MONOS PCT: 8 % (ref 3–12)
Monocytes Absolute: 1 10*3/uL (ref 0.1–1.0)
NEUTROS PCT: 54 % (ref 43–77)
Neutro Abs: 6.7 10*3/uL (ref 1.7–7.7)
PLATELETS: 219 10*3/uL (ref 150–400)
RBC: 5.35 MIL/uL (ref 4.22–5.81)
RDW: 14.3 % (ref 11.5–15.5)
WBC: 12.4 10*3/uL — AB (ref 4.0–10.5)

## 2014-01-14 NOTE — Progress Notes (Signed)
Assessment and Plan:  Hypertension: Continue medication, monitor blood pressure at home. Continue DASH diet. Cholesterol: Continue diet and exercise. Check cholesterol.  Diabetes-Continue diet and exercise. Check A1C Vitamin D Def- check level and continue medications.   Continue diet and meds as discussed. Further disposition pending results of labs. Discussed med's effects and SE's.    HPI 53 y.o. male  presents for 3 month follow up with hypertension, hyperlipidemia, diabetes and vitamin D. His blood pressure has been controlled at home, today their BP is BP: 128/70 mmHg He does workout. He denies chest pain, shortness of breath, dizziness.  He is on cholesterol medication and denies myalgias. His cholesterol is at goal. The cholesterol last visit was:   Lab Results  Component Value Date   CHOL 93 10/09/2013   HDL 24* 10/09/2013   LDLCALC 22 10/09/2013   TRIG 234* 10/09/2013   CHOLHDL 3.9 10/09/2013   He has been working on diet and exercise for Diabetes, he is on 3 MF a day, and denies paresthesia of the feet, polydipsia and polyuria. Last A1C in the office was:  Lab Results  Component Value Date   HGBA1C 6.0* 10/09/2013   Patient is on Vitamin D supplement. Lab Results  Component Value Date   VD25OH 115* 10/09/2013     He is on thyroid medication. His medication was not changed last visit. Patient denies nervousness, palpitations and weight changes.  Lab Results  Component Value Date   TSH 3.075 10/09/2013  .  He had recent kidney stones which he was taking oxycodone for, had constipation with it was given amitza and is now doing better. Rarely take oxycodone for back pain and no more constipation.  He is not on a bASA due to history of GERD/Hpylori and he has a    Current Medications:  Current Outpatient Prescriptions on File Prior to Visit  Medication Sig Dispense Refill  . B-D 3CC LUER-LOK SYR 21GX1" 21G X 1" 3 ML MISC USE AS DIRECTED  20 each  0  . cetirizine (ZYRTEC) 10  MG tablet Take 10 mg by mouth daily.      . Cholecalciferol (VITAMIN D-3) 5000 UNITS TABS Take 10,000 Units by mouth daily.      . cyclobenzaprine (FLEXERIL) 10 MG tablet Take 10 mg by mouth daily as needed for muscle spasms. Takes prn      . dextroamphetamine (DEXEDRINE SPANSULE) 15 MG 24 hr capsule Take 15 mg by mouth daily as needed.      Marland Kitchen levothyroxine (SYNTHROID) 50 MCG tablet Take 1 tablet (50 mcg total) by mouth daily before breakfast.  90 tablet  99  . metFORMIN (GLUCOPHAGE-XR) 500 MG 24 hr tablet TAKE 2 TABLETS TWICE A DAY FOR DIABETES  360 tablet  2  . naproxen (NAPROSYN) 500 MG tablet Take 500 mg by mouth 2 (two) times daily with a meal. Takes bid      . ONE TOUCH ULTRA TEST test strip USE TO TEST BLOOD SUGAR 3 TIMES DAILY  100 each  3  . ONETOUCH DELICA LANCETS 12W MISC USE TO CHECK SUGAR DAILY  100 each  0  . oxyCODONE-acetaminophen (PERCOCET/ROXICET) 5-325 MG per tablet Take 1 tablet by mouth every 8 (eight) hours as needed for moderate pain or severe pain.  15 tablet  0  . testosterone cypionate (DEPOTESTOTERONE CYPIONATE) 200 MG/ML injection INJECT 2ML INTRAMUSCULARLY EVERY 2 WEEKS  10 mL  1  . vitamin C (ASCORBIC ACID) 500 MG tablet Take 500 mg by  mouth 3 (three) times daily as needed (immune support).       No current facility-administered medications on file prior to visit.   Medical History:  Past Medical History  Diagnosis Date  . Hypertension   . Hyperlipidemia   . Diabetes mellitus without complication   . DDD (degenerative disc disease)   . Vitamin D deficiency   . Hypogonadism male   . GERD (gastroesophageal reflux disease)    Allergies: No Known Allergies   Review of Systems: [X]  = complains of  [ ]  = denies  General: Fatigue [ ]  Fever [ ]  Chills [ ]  Weakness [ ]   Insomnia [ ]  Eyes: Redness [ ]  Blurred vision [ ]  Diplopia [ ]   ENT: Congestion [ ]  Sinus Pain [ ]  Post Nasal Drip [ ]  Sore Throat [ ]  Earache [ ]   Cardiac: Chest pain/pressure [ ]  SOB [ ]   Orthopnea [ ]   Palpitations [ ]   Paroxysmal nocturnal dyspnea[ ]  Claudication [ ]  Edema [ ]   Pulmonary: Cough [ ]  Wheezing[ ]   SOB [ ]   Snoring [ ]   GI: Nausea [ ]  Vomiting[ ]  Dysphagia[ ]  Heartburn[ ]  Abdominal pain [ ]  Constipation [ ] ; Diarrhea [ ] ; BRBPR [ ]  Melena[ ]  GU: Hematuria[ ]  Dysuria [ ]  Nocturia[ ]  Urgency [ ]   Hesitancy [ ]  Discharge [ ]  Neuro: Headaches[ ]  Vertigo[ ]  Paresthesias[ ]  Spasm [ ]  Speech changes [ ]  Incoordination [ ]   Ortho: Arthritis [ ]  Joint pain [x ] Muscle pain [x ] Joint swelling [ ]  Back Pain [x ] Skin:  Rash [ ]   Pruritis [ ]  Change in skin lesion [ ]   Psych: Depression[ ]  Anxiety[ ]  Confusion [ ]  Memory loss [ ]   Heme/Lypmh: Bleeding [ ]  Bruising [ ]  Enlarged lymph nodes [ ]   Endocrine: Visual blurring [ ]  Paresthesia [ ]  Polyuria [ ]  Polydypsea [ ]    Heat/cold intolerance [ ]  Hypoglycemia [ ]   Family history- Review and unchanged Social history- Review and unchanged Physical Exam: BP 128/70  Pulse 72  Temp(Src) 97.7 F (36.5 C)  Resp 16  Ht 5\' 10"  (1.778 m)  Wt 191 lb (86.637 kg)  BMI 27.41 kg/m2 Wt Readings from Last 3 Encounters:  01/14/14 191 lb (86.637 kg)  10/27/13 183 lb 12.8 oz (83.371 kg)  10/09/13 192 lb 3.2 oz (87.181 kg)   General Appearance: Well nourished, in no apparent distress. Eyes: PERRLA, EOMs, conjunctiva no swelling or erythema Sinuses: No Frontal/maxillary tenderness ENT/Mouth: Ext aud canals clear, TMs without erythema, bulging. No erythema, swelling, or exudate on post pharynx.  Tonsils not swollen or erythematous. Hearing normal.  Neck: Supple, thyroid normal.  Respiratory: Respiratory effort normal, BS equal bilaterally without rales, rhonchi, wheezing or stridor.  Cardio: RRR with no MRGs. Brisk peripheral pulses without edema.  Abdomen: Soft, + BS.  Non tender, no guarding, rebound, masses. Small umbilical hernia. Lymphatics: Non tender without lymphadenopathy.  Musculoskeletal: Full ROM, 5/5 strength, normal  gait.  Skin: Warm, dry without rashes, lesions, ecchymosis.  Neuro: Cranial nerves intact. No cerebellar symptoms. Sensation intact.  Psych: Awake and oriented X 3, normal affect, Insight and Judgment appropriate.    Vicie Mutters 4:39 PM

## 2014-01-14 NOTE — Patient Instructions (Signed)
   Bad carbs also include fruit juice, alcohol, and sweet tea. These are empty calories that do not signal to your brain that you are full.   Please remember the good carbs are still carbs which convert into sugar. So please measure them out no more than 1/2-1 cup of rice, oatmeal, pasta, and beans.  Veggies are however free foods! Pile them on.   I like lean protein at every meal such as chicken, turkey, pork chops, cottage cheese, etc. Just do not fry these meats and please center your meal around vegetable, the meats should be a side dish.   No all fruit is created equal. Please see the list below, the fruit at the bottom is higher in sugars than the fruit at the top   Recommendations For Diabetic Patients:   -  Take medications as prescribed  -  Recommend Dr Joel Fuhrman's book "The End of Diabetes " - Can get at  www.Amazon.com and encourage also get the Audio CD book  - AVOID Animal products, ie. Meat - red/white, Poultry and Dairy/especially cheese - Exercise at least 5 times a week for 30 minutes or preferably daily.  - No Smoking - Drink less than 2 drinks a day.  - Monitor your feet for sores - Have yearly Eye Exams - Recommend annual Flu vaccine  - Recommend Pneumovax and Prevnar vaccines - Shingles Vaccine (Zostavax) if over 60 y.o.  Goals:   - BMI less than 24 - Fasting sugar less than 130 or less than 150 if tapering medicines to lose weight  - Systolic BP less than 130  - Diastolic BP less than 80 - Bad LDL Cholesterol less than 70 - Triglycerides less than 150  

## 2014-01-15 LAB — HEPATIC FUNCTION PANEL
ALT: 24 U/L (ref 0–53)
AST: 23 U/L (ref 0–37)
Albumin: 4.2 g/dL (ref 3.5–5.2)
Alkaline Phosphatase: 54 U/L (ref 39–117)
BILIRUBIN DIRECT: 0.1 mg/dL (ref 0.0–0.3)
BILIRUBIN INDIRECT: 0.2 mg/dL (ref 0.2–1.2)
BILIRUBIN TOTAL: 0.3 mg/dL (ref 0.2–1.2)
Total Protein: 6.8 g/dL (ref 6.0–8.3)

## 2014-01-15 LAB — HEMOGLOBIN A1C
HEMOGLOBIN A1C: 6.1 % — AB (ref <5.7)
Mean Plasma Glucose: 128 mg/dL — ABNORMAL HIGH (ref <117)

## 2014-01-15 LAB — LIPID PANEL
CHOL/HDL RATIO: 3.4 ratio
CHOLESTEROL: 89 mg/dL (ref 0–200)
HDL: 26 mg/dL — AB (ref >39)
LDL Cholesterol: 20 mg/dL (ref 0–99)
TRIGLYCERIDES: 215 mg/dL — AB (ref <150)
VLDL: 43 mg/dL — AB (ref 0–40)

## 2014-01-15 LAB — BASIC METABOLIC PANEL WITH GFR
BUN: 15 mg/dL (ref 6–23)
CO2: 29 meq/L (ref 19–32)
Calcium: 10 mg/dL (ref 8.4–10.5)
Chloride: 104 mEq/L (ref 96–112)
Creat: 0.97 mg/dL (ref 0.50–1.35)
GFR, Est African American: >89 mL/min
GFR, Est Non African American: >89 mL/min
GLUCOSE: 70 mg/dL (ref 70–99)
Potassium: 4.6 mEq/L (ref 3.5–5.3)
SODIUM: 139 meq/L (ref 135–145)

## 2014-01-15 LAB — TSH: TSH: 3.395 u[IU]/mL (ref 0.350–4.500)

## 2014-01-15 LAB — INSULIN, FASTING: Insulin fasting, serum: 14.1 u[IU]/mL (ref 2.0–19.6)

## 2014-01-15 LAB — VITAMIN D 25 HYDROXY (VIT D DEFICIENCY, FRACTURES): Vit D, 25-Hydroxy: 104 ng/mL — ABNORMAL HIGH (ref 30–89)

## 2014-01-15 LAB — MAGNESIUM: MAGNESIUM: 1.9 mg/dL (ref 1.5–2.5)

## 2014-04-15 ENCOUNTER — Encounter: Payer: Self-pay | Admitting: Internal Medicine

## 2014-04-15 ENCOUNTER — Ambulatory Visit (INDEPENDENT_AMBULATORY_CARE_PROVIDER_SITE_OTHER): Payer: 59 | Admitting: Internal Medicine

## 2014-04-15 VITALS — BP 122/78 | HR 72 | Temp 98.1°F | Resp 16 | Ht 70.25 in | Wt 185.4 lb

## 2014-04-15 DIAGNOSIS — I10 Essential (primary) hypertension: Secondary | ICD-10-CM

## 2014-04-15 DIAGNOSIS — E1129 Type 2 diabetes mellitus with other diabetic kidney complication: Secondary | ICD-10-CM

## 2014-04-15 DIAGNOSIS — E559 Vitamin D deficiency, unspecified: Secondary | ICD-10-CM

## 2014-04-15 DIAGNOSIS — E349 Endocrine disorder, unspecified: Secondary | ICD-10-CM

## 2014-04-15 DIAGNOSIS — Z79899 Other long term (current) drug therapy: Secondary | ICD-10-CM

## 2014-04-15 DIAGNOSIS — E782 Mixed hyperlipidemia: Secondary | ICD-10-CM

## 2014-04-15 LAB — CBC WITH DIFFERENTIAL/PLATELET
Basophils Absolute: 0 10*3/uL (ref 0.0–0.1)
Basophils Relative: 0 % (ref 0–1)
Eosinophils Absolute: 0.2 10*3/uL (ref 0.0–0.7)
Eosinophils Relative: 2 % (ref 0–5)
HEMATOCRIT: 47.5 % (ref 39.0–52.0)
HEMOGLOBIN: 16.7 g/dL (ref 13.0–17.0)
LYMPHS ABS: 4.4 10*3/uL — AB (ref 0.7–4.0)
LYMPHS PCT: 40 % (ref 12–46)
MCH: 30.5 pg (ref 26.0–34.0)
MCHC: 35.2 g/dL (ref 30.0–36.0)
MCV: 86.7 fL (ref 78.0–100.0)
MONOS PCT: 11 % (ref 3–12)
MPV: 10.6 fL (ref 9.4–12.4)
Monocytes Absolute: 1.2 10*3/uL — ABNORMAL HIGH (ref 0.1–1.0)
NEUTROS ABS: 5.2 10*3/uL (ref 1.7–7.7)
Neutrophils Relative %: 47 % (ref 43–77)
Platelets: 234 10*3/uL (ref 150–400)
RBC: 5.48 MIL/uL (ref 4.22–5.81)
RDW: 14.1 % (ref 11.5–15.5)
WBC: 11 10*3/uL — AB (ref 4.0–10.5)

## 2014-04-15 LAB — HEMOGLOBIN A1C
HEMOGLOBIN A1C: 6 % — AB (ref <5.7)
Mean Plasma Glucose: 126 mg/dL — ABNORMAL HIGH (ref <117)

## 2014-04-15 NOTE — Patient Instructions (Signed)

## 2014-04-15 NOTE — Progress Notes (Signed)
Patient ID: Paul Gates, male   DOB: November 28, 1960, 53 y.o.   MRN: [id number]   This very nice 53 y.o.SWM presents for 3 month follow up with Hypertension, Hyperlipidemia, T2_NIDDM and Vitamin D Deficiency.    Patient is treated for HTN & BP has been controlled at home. Today's BP was 122/78 mmHg. Patient has had no complaints of any cardiac type chest pain, palpitations, dyspnea/orthopnea/PND, dizziness, claudication, or dependent edema.   Hyperlipidemia is controlled with diet & meds. Patient denies myalgias or other med SE's. Last Lipids were Total Chol  115; HDL  23; LDL 20; Trig 359 on 04/15/2014   Also, the patient has history of T2_NIDDM and has had no symptoms of reactive hypoglycemia, diabetic polys, paresthesias or visual blurring.  Last A1c was  6.0% on  04/15/2014.   Further, the patient also has history of Vitamin D Deficiency and supplements vitamin D without any suspected side-effects. Last vitamin D was  79 on  04/15/2014.   Medication List   cyclobenzaprine 10 MG tablet  Commonly known as:  FLEXERIL  Take 10 mg by mouth daily as needed for muscle spasms. Takes prn     dextroamphetamine 15 MG 24 hr capsule  Commonly known as:  DEXEDRINE SPANSULE  Take 15 mg by mouth daily as needed.     levothyroxine 50 MCG tablet  Commonly known as:  SYNTHROID  Take 1 tablet (50 mcg total) by mouth daily before breakfast.     metFORMIN 500 MG 24 hr tablet  Commonly known as:  GLUCOPHAGE-XR  TAKE 2 TABLETS TWICE A DAY FOR DIABETES     naproxen 500 MG tablet  Commonly known as:  NAPROSYN  Take 500 mg by mouth 2 (two) times daily with a meal. Takes bid     oxyCODONE-acetaminophen 5-325 MG per tablet  Commonly known as:  PERCOCET/ROXICET  Take 1 tablet by mouth every 8 (eight) hours as needed for moderate pain or severe pain.     testosterone cypionate 200 MG/ML injection  Commonly known as:  DEPOTESTOTERONE CYPIONATE  INJECT 2ML INTRAMUSCULARLY EVERY 2 WEEKS     vitamin C 500  MG tablet  Commonly known as:  ASCORBIC ACID  Take 500 mg by mouth 3 (three) times daily as needed (immune support).     Vitamin D-3 5000 UNITS Tabs  Take 10,000 Units by mouth daily.     No Known Allergies  PMHx:   Past Medical History  Diagnosis Date  . Hypertension   . Hyperlipidemia   . Diabetes mellitus without complication   . DDD (degenerative disc disease)   . Vitamin D deficiency   . Hypogonadism male   . GERD (gastroesophageal reflux disease)    Immunization History  Administered Date(s) Administered  . DTaP 04/04/2001, 04/24/2008  . Influenza Whole 01/09/2013  . PPD Test 10/09/2013  . Tdap 07/17/2013   Past Surgical History  Procedure Laterality Date  . Lumbar disc surgery    . 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 Esophagogastroduodenoscopy  [phone]    duodenal ulcer  . Lumbar laminectomy  7/97  . Lumbar fusion  2001  . Cervical fusion  1991   FHx:    Reviewed / unchanged  SHx:    Reviewed / unchanged  Systems Review:  Constitutional: Denies fever, chills, wt changes, headaches, insomnia, fatigue, night sweats, change in appetite. Eyes: Denies redness, blurred vision, diplopia, discharge, itchy, watery eyes.  ENT: Denies discharge, congestion, post nasal drip, epistaxis,  sore throat, earache, hearing loss, dental pain, tinnitus, vertigo, sinus pain, snoring.  CV: Denies chest pain, palpitations, irregular heartbeat, syncope, dyspnea, diaphoresis, orthopnea, PND, claudication or edema. Respiratory: denies cough, dyspnea, DOE, pleurisy, hoarseness, laryngitis, wheezing.  Gastrointestinal: Denies dysphagia, odynophagia, heartburn, reflux, water brash, abdominal pain or cramps, nausea, vomiting, bloating, diarrhea, constipation, hematemesis, melena, hematochezia  or hemorrhoids. Genitourinary: Denies dysuria, frequency, urgency, nocturia, hesitancy, discharge, hematuria or flank pain. Musculoskeletal: Denies arthralgias, myalgias, stiffness, jt.  swelling, pain, limping or strain/sprain.  Skin: Denies pruritus, rash, hives, warts, acne, eczema or change in skin lesion(s). Neuro: No weakness, tremor, incoordination, spasms, paresthesia or pain. Psychiatric: Denies confusion, memory loss or sensory loss. Endo: Denies change in weight, skin or hair change.  Heme/Lymph: No excessive bleeding, bruising or enlarged lymph nodes.  Physical Exam  BP 122/78   Pulse 72  Temp 98.1 F   Resp 16  Ht 5' 10.25"   Wt 185 lb 6.4 oz)      BMI 26.42   Appears well nourished and in no distress.  Eyes: PERRLA, EOMs, conjunctiva no swelling or erythema. Sinuses: No frontal/maxillary tenderness ENT/Mouth: EAC's clear, TM's nl w/o erythema, bulging. Nares clear w/o erythema, swelling, exudates. Oropharynx clear without erythema or exudates. Oral hygiene is good. Tongue normal, non obstructing. Hearing intact.  Neck: Supple. Thyroid nl. Car 2+/2+ without bruits, nodes or JVD. Chest: Respirations nl with BS clear & equal w/o rales, rhonchi, wheezing or stridor.  Cor: Heart sounds normal w/ regular rate and rhythm without sig. murmurs, gallops, clicks, or rubs. Peripheral pulses normal and equal  without edema.  Abdomen: Soft & bowel sounds normal. Non-tender w/o guarding, rebound, hernias, masses, or organomegaly.  Lymphatics: Unremarkable.  Musculoskeletal: Full ROM all peripheral extremities, joint stability, 5/5 strength, and normal gait.  Skin: Warm, dry without exposed rashes, lesions or ecchymosis apparent.  Neuro: Cranial nerves intact, reflexes equal bilaterally. Sensory-motor testing grossly intact. Tendon reflexes grossly intact.  Pysch: Alert & oriented x 3.  Insight and judgement nl & appropriate. No ideations.  Assessment and Plan:  1. Hypertension - Continue monitor blood pressure at home. Continue diet/meds same.  2. Hyperlipidemia - Continue diet/meds, exercise,& lifestyle modifications. Continue monitor periodic cholesterol/liver &  renal functions   3. T2_NIDDM  - Continue diet, exercise, lifestyle modifications. Monitor appropriate labs.  4. Vitamin D Deficiency - Continue supplementation.  5. Testosterone Deficiency -    Recommended regular exercise, BP monitoring, weight control, and discussed med and SE's. Recommended labs to assess and monitor clinical status. Further disposition pending results of labs.

## 2014-04-16 LAB — BASIC METABOLIC PANEL WITH GFR
BUN: 17 mg/dL (ref 6–23)
CO2: 25 meq/L (ref 19–32)
Calcium: 9.9 mg/dL (ref 8.4–10.5)
Chloride: 104 mEq/L (ref 96–112)
Creat: 0.91 mg/dL (ref 0.50–1.35)
GFR, Est Non African American: >89 mL/min
GLUCOSE: 97 mg/dL (ref 70–99)
POTASSIUM: 4 meq/L (ref 3.5–5.3)
SODIUM: 141 meq/L (ref 135–145)

## 2014-04-16 LAB — LIPID PANEL
CHOL/HDL RATIO: 5 ratio
CHOLESTEROL: 115 mg/dL (ref 0–200)
HDL: 23 mg/dL — AB (ref >39)
LDL Cholesterol: 20 mg/dL (ref 0–99)
Triglycerides: 359 mg/dL — ABNORMAL HIGH (ref <150)
VLDL: 72 mg/dL — ABNORMAL HIGH (ref 0–40)

## 2014-04-16 LAB — HEPATIC FUNCTION PANEL
ALT: 20 U/L (ref 0–53)
AST: 16 U/L (ref 0–37)
Albumin: 4.5 g/dL (ref 3.5–5.2)
Alkaline Phosphatase: 61 U/L (ref 39–117)
BILIRUBIN DIRECT: 0.1 mg/dL (ref 0.0–0.3)
BILIRUBIN TOTAL: 0.3 mg/dL (ref 0.2–1.2)
Indirect Bilirubin: 0.2 mg/dL (ref 0.2–1.2)
Total Protein: 6.7 g/dL (ref 6.0–8.3)

## 2014-04-16 LAB — INSULIN, FASTING: INSULIN FASTING, SERUM: 46 u[IU]/mL — AB (ref 2.0–19.6)

## 2014-04-16 LAB — TSH: TSH: 3.041 u[IU]/mL (ref 0.350–4.500)

## 2014-04-16 LAB — TESTOSTERONE: Testosterone: 574 ng/dL (ref 300–890)

## 2014-04-16 LAB — MAGNESIUM: Magnesium: 1.9 mg/dL (ref 1.5–2.5)

## 2014-04-16 LAB — VITAMIN D 25 HYDROXY (VIT D DEFICIENCY, FRACTURES): Vit D, 25-Hydroxy: 79 ng/mL (ref 30–100)

## 2014-05-04 ENCOUNTER — Other Ambulatory Visit: Payer: Self-pay | Admitting: Physician Assistant

## 2014-05-04 DIAGNOSIS — E349 Endocrine disorder, unspecified: Secondary | ICD-10-CM

## 2014-05-09 ENCOUNTER — Encounter: Payer: Self-pay | Admitting: *Deleted

## 2014-05-11 ENCOUNTER — Other Ambulatory Visit: Payer: Self-pay | Admitting: Internal Medicine

## 2014-07-17 ENCOUNTER — Other Ambulatory Visit: Payer: Self-pay | Admitting: Internal Medicine

## 2014-07-20 ENCOUNTER — Ambulatory Visit (INDEPENDENT_AMBULATORY_CARE_PROVIDER_SITE_OTHER): Payer: 59 | Admitting: Physician Assistant

## 2014-07-20 VITALS — BP 138/78 | HR 68 | Temp 97.9°F | Resp 16 | Wt 178.0 lb

## 2014-07-20 DIAGNOSIS — E782 Mixed hyperlipidemia: Secondary | ICD-10-CM

## 2014-07-20 DIAGNOSIS — E559 Vitamin D deficiency, unspecified: Secondary | ICD-10-CM

## 2014-07-20 DIAGNOSIS — E349 Endocrine disorder, unspecified: Secondary | ICD-10-CM

## 2014-07-20 DIAGNOSIS — E1129 Type 2 diabetes mellitus with other diabetic kidney complication: Secondary | ICD-10-CM

## 2014-07-20 DIAGNOSIS — I10 Essential (primary) hypertension: Secondary | ICD-10-CM

## 2014-07-20 DIAGNOSIS — Z79899 Other long term (current) drug therapy: Secondary | ICD-10-CM

## 2014-07-20 MED ORDER — METFORMIN HCL ER 500 MG PO TB24: ORAL_TABLET | ORAL | Status: DC

## 2014-07-20 NOTE — Patient Instructions (Addendum)
Add ENTERIC COATED low dose 81 mg Aspirin daily OR can do every other day if you have easy bruising to protect your heart and head.   Diabetes is a very complicated disease...lets simplify it.  An easy way to look at it to understand the complications is if you think of the extra sugar floating in your blood stream as glass shards floating through your blood stream.    Diabetes affects your small vessels first: 1) The glass shards (sugar) scraps down the tiny blood vessels in your eyes and lead to diabetic retinopathy, the leading cause of blindness in the [place]. Diabetes is the leading cause of newly diagnosed adult (52 to 52 years of age) blindness in the [place].  2) The glass shards scratches down the tiny vessels of your legs leading to nerve damage called neuropathy and can lead to amputations of your feet. More than 60% of all non-traumatic amputations of lower limbs occur in people with diabetes.  3) Over time the small vessels in your brain are shredded and closed off, individually this does not cause any problems but over a long period of time many of the small vessels being blocked can lead to Vascular Dementia.   4) Your kidney's are a filter system and have a "net" that keeps certain things in the body and lets bad things out. Sugar shreds this net and leads to kidney damage and eventually failure. Decreasing the sugar that is destroying the net and certain blood pressure medications can help stop or decrease progression of kidney disease. Diabetes was the primary cause of kidney failure in 44 percent of all new cases in 2011.  5) Diabetes also destroys the small vessels in your penis that lead to erectile dysfunction. Eventually the vessels are so damaged that you may not be responsive to cialis or viagra.   Diabetes and your large vessels: Your larger vessels consist of your coronary arteries in your heart and the carotid vessels to your brain. Diabetes or even increased sugars  put you at 300% increased risk of heart attack and stroke and this is why.. The sugar scrapes down your large blood vessels and your body sees this as an internal injury and tries to repair itself. Just like you get a scab on your skin, your platelets will stick to the blood vessel wall trying to heal it. This is why we have diabetics on low dose aspirin daily, this prevents the platelets from sticking and can prevent plaque formation. In addition, your body takes cholesterol and tries to shove it into the open wound. This is why we want your LDL, or bad cholesterol, below 70.   The combination of platelets and cholesterol over 5-10 years forms plaque that can break off and cause a heart attack or stroke.   PLEASE REMEMBER:  Diabetes is preventable! Up to 44 percent of complications and morbidities among individuals with type 2 diabetes can be prevented, delayed, or effectively treated and minimized with regular visits to a health professional, appropriate monitoring and medication, and a healthy diet and lifestyle.   Your A1C is a measure of your sugar over the past 3 months and is not affected by what you have eaten over the past few days. Diabetes increases your chances of stroke and heart attack over 300 % and is the leading cause of blindness and kidney failure in the [place]. Please make sure you decrease bad carbs like white bread, white rice, potatoes, corn, soft drinks, pasta, cereals,  refined sugars, sweet tea, dried fruits, and fruit juice. Good carbs are okay to eat in moderation like sweet potatoes, brown rice, whole grain pasta/bread, most fruit (except dried fruit) and you can eat as many veggies as you want.   Greater than 6.5 is considered diabetic. Between 6.4 and 5.7 is prediabetic If your A1C is less than 5.7 you are NOT diabetic.  Targets for Glucose Readings: Time of Check Target for patients WITHOUT Diabetes Target for DIABETICS  Before Meals Less than 100  less than 150   Two hours after meals Less than 200  Less than 250   If your morning sugar is always below 100 then the issue is with your sugar spiking after meals. Try to take your blood sugar approximately 2 hours after eating, this number should be less than 200. If it is not, think about the foods that you ate and better choices you can make.   Before you even begin to attack a weight-loss plan, it pays to remember this: You are not fat. You have fat. Losing weight isn't about blame or shame; it's simply another achievement to accomplish. Dieting is like any other skill-you have to buckle down and work at it. As long as you act in a smart, reasonable way, you'll ultimately get where you want to be. Here are some weight loss pearls for you.  1. It's Not a Diet. It's a Lifestyle Thinking of a diet as something you're on and suffering through only for the short term doesn't work. To shed weight and keep it off, you need to make permanent changes to the way you eat. It's OK to indulge occasionally, of course, but if you cut calories temporarily and then revert to your old way of eating, you'll gain back the weight quicker than you can say yo-yo. Use it to lose it. Research shows that one of the best predictors of long-term weight loss is how many pounds you drop in the first month. For that reason, nutritionists often suggest being stricter for the first two weeks of your new eating strategy to build momentum. Cut out added sugar and alcohol and avoid unrefined carbs. After that, figure out how you can reincorporate them in a way that's healthy and maintainable.  2. There's a Right Way to Exercise Working out burns calories and fat and boosts your metabolism by building muscle. But those trying to lose weight are notorious for overestimating the number of calories they burn and underestimating the amount they take in. Unfortunately, your system is biologically programmed to hold on to extra pounds and that means when  you start exercising, your body senses the deficit and ramps up its hunger signals. If you're not diligent, you'll eat everything you burn and then some. Use it to lose it. Cardio gets all the exercise glory, but strength and interval training are the real heroes. They help you build lean muscle, which in turn increases your metabolism and calorie-burning ability 3. Don't Overreact to Mild Hunger Some people have a hard time losing weight because of hunger anxiety. To them, being hungry is bad-something to be avoided at all costs-so they carry snacks with them and eat when they don't need to. Others eat because they're stressed out or bored. While you never want to get to the point of being ravenous (that's when bingeing is likely to happen), a hunger pang, a craving, or the fact that it's 3:00 p.m. should not send you racing for the vending machine or  obsessing about the energy bar in your purse. Ideally, you should put off eating until your stomach is growling and it's difficult to concentrate.  Use it to lose it. When you feel the urge to eat, use the HALT method. Ask yourself, Am I really hungry? Or am I angry or anxious, lonely or bored, or tired? If you're still not certain, try the apple test. If you're truly hungry, an apple should seem delicious; if it doesn't, something else is going on. Or you can try drinking water and making yourself busy, if you are still hungry try a healthy snack.  4. Not All Calories Are Created Equal The mechanics of weight loss are pretty simple: Take in fewer calories than you use for energy. But the kind of food you eat makes all the difference. Processed food that's high in saturated fat and refined starch or sugar can cause inflammation that disrupts the hormone signals that tell your brain you're full. The result: You eat a lot more.  Use it to lose it. Clean up your diet. Swap in whole, unprocessed foods, including vegetables, lean protein, and healthy fats that will  fill you up and give you the biggest nutritional bang for your calorie buck. In a few weeks, as your brain starts receiving regular hunger and fullness signals once again, you'll notice that you feel less hungry overall and naturally start cutting back on the amount you eat.  5. Protein, Produce, and Plant-Based Fats Are Your Weight-Loss Trinity Here's why eating the three Ps regularly will help you drop pounds. Protein fills you up. You need it to build lean muscle, which keeps your metabolism humming so that you can torch more fat. People in a weight-loss program who ate double the recommended daily allowance for protein (about 110 grams for a 150-pound woman) lost 70 percent of their weight from fat, while people who ate the RDA lost only about 40 percent, one study found. Produce is packed with filling fiber. "It's very difficult to consume too many calories if you're eating a lot of vegetables. Example: Three cups of broccoli is a lot of food, yet only 93 calories. (Fruit is another story. It can be easy to overeat and can contain a lot of calories from sugar, so be sure to monitor your intake.) Plant-based fats like olive oil and those in avocados and nuts are healthy and extra satiating.  Use it to lose it. Aim to incorporate each of the three Ps into every meal and snack. People who eat protein throughout the day are able to keep weight off, according to a study in the [place] [employer]. In addition to meat, poultry and seafood, good sources are beans, lentils, eggs, tofu, and yogurt. As for fat, keep portion sizes in check by measuring out salad dressing, oil, and nut butters (shoot for one to two tablespoons). Finally, eat veggies or a little fruit at every meal. People who did that consumed 308 fewer calories but didn't feel any hungrier than when they didn't eat more produce.  7. How You Eat Is As Important As What You Eat In order for your brain to register that you're  full, you need to focus on what you're eating. Sit down whenever you eat, preferably at a table. Turn off the TV or computer, put down your phone, and look at your food. Smell it. Chew slowly, and don't put another bite on your fork until you swallow. When women ate lunch this attentively, they  consumed 30 percent less when snacking later than those who listened to an audiobook at lunchtime, according to a study in the [place] [employer]. 8. Weighing Yourself Really Works The scale provides the best evidence about whether your efforts are paying off. Seeing the numbers tick up or down or stagnate is motivation to keep going-or to rethink your approach. A 2015 study at [facility] found that daily weigh-ins helped people lose more weight, keep it off, and maintain that loss, even after two years. Use it to lose it. Step on the scale at the same time every day for the best results. If your weight shoots up several pounds from one weigh-in to the next, don't freak out. Eating a lot of salt the night before or having your period is the likely culprit. The number should return to normal in a day or two. It's a steady climb that you need to do something about. 9. Too Much Stress and Too Little Sleep Are Your Enemies When you're tired and frazzled, your body cranks up the production of cortisol, the stress hormone that can cause carb cravings. Not getting enough sleep also boosts your levels of ghrelin, a hormone associated with hunger, while suppressing leptin, a hormone that signals fullness and satiety. People on a diet who slept only five and a half hours a night for two weeks lost 55 percent less fat and were hungrier than those who slept eight and a half hours, according to a study in the [place]. Use it to lose it. Prioritize sleep, aiming for seven hours or more a night, which research shows helps lower stress. And make sure you're getting quality zzz's. If a  snoring spouse or a fidgety cat wakes you up frequently throughout the night, you may end up getting the equivalent of just four hours of sleep, according to a study from [facility]. Keep pets out of the bedroom, and use a white-noise app to drown out snoring. 10. You Will Hit a plateau-And You Can Bust Through It As you slim down, your body releases much less leptin, the fullness hormone.  If you're not strength training, start right now. Building muscle can raise your metabolism to help you overcome a plateau. To keep your body challenged and burning calories, incorporate new moves and more intense intervals into your workouts or add another sweat session to your weekly routine. Alternatively, cut an extra 100 calories or so a day from your diet. Now that you've lost weight, your body simply doesn't need as much fuel.

## 2014-07-20 NOTE — Progress Notes (Signed)
Assessment and Plan:  1. Hypertension -Continue medication, monitor blood pressure at home. Continue DASH diet.  Reminder to go to the ER if any CP, SOB, nausea, dizziness, severe HA, changes vision/speech, left arm numbness and tingling and jaw pain.  2. Cholesterol -Continue diet and exercise. Check cholesterol.   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 Check A1C  4. Vitamin D Def - check level and continue medications.   5. Hypogonadism-  continue replacement therapy, check testosterone levels as needed.   6. Smoking cessation-  commended patient for quitting and reviewed strategies for preventing relapses, continue to try chantix  7. Hypothyroidism- check TSH level, continue medications the same, reminded to take on an empty stomach 30-3mins before food.   Continue diet and meds as discussed. Further disposition pending results of labs. Discussed med's effects and SE's.   Over 30 minutes of exam, counseling, chart review, and critical decision making was performed   HPI 54 y.o. male  presents for 3 month follow up on hypertension, cholesterol, diabetes and vitamin D deficiency.   His blood pressure has been controlled at home, today his BP is BP: 138/78 mmHg.  He does not workout but is very active at work. He denies chest pain, shortness of breath, dizziness.  He is not on cholesterol medication and denies myalgias. His cholesterol is not at goal, LDL is at goal, his HDL and trigs are not at goal. The cholesterol was:   Lab Results  Component Value Date   CHOL 115 04/15/2014   HDL 23* 04/15/2014   LDLCALC 20 04/15/2014   TRIG 359* 04/15/2014   CHOLHDL 5.0 04/15/2014   He has been working on diet and exercise for diabetes with diabetic chronic kidney disease, he is not on bASA, he is not on ACE/ARB, and denies  paresthesia of the feet, polydipsia, polyuria and visual disturbances. Last A1C was:  Lab Results  Component Value Date    HGBA1C 6.0* 04/15/2014  Patient is on Vitamin D supplement. Lab Results  Component Value Date   VD25OH 75 04/15/2014  He is on thyroid medication. His medication was not changed last visit.   Lab Results  Component Value Date   TSH 3.041 04/15/2014  He has smoked for 30 years, but is doing chantix and has cut back and is planning on quitting when he sees Dr. Melford Aase in June/Sept.  He has a history of testosterone deficiency and is on testosterone replacement, 2 cc q 2 weeks, last one was 07/16/2014. He states that the testosterone has not really been helping him.  Lab Results  Component Value Date   TESTOSTERONE 574 04/15/2014    Current Medications:  Current Outpatient Prescriptions on File Prior to Visit  Medication Sig Dispense Refill  . B-D 3CC LUER-LOK SYR 21GX1" 21G X 1" 3 ML MISC USE AS DIRECTED 20 each 0  . cetirizine (ZYRTEC) 10 MG tablet Take 10 mg by mouth daily.    . Cholecalciferol (VITAMIN D-3) 5000 UNITS TABS Take 10,000 Units by mouth daily.    . cyclobenzaprine (FLEXERIL) 10 MG tablet Take 10 mg by mouth daily as needed for muscle spasms. Takes prn    . dextroamphetamine (DEXEDRINE SPANSULE) 15 MG 24 hr capsule Take 15 mg by mouth daily as needed.    Marland Kitchen levothyroxine (SYNTHROID, LEVOTHROID) 50 MCG tablet TAKE 1 TABLET BY MOUTH ONCE DAILY BEFORE BREAKFAST 90 tablet 2  . metFORMIN (GLUCOPHAGE-XR) 500 MG 24 hr tablet TAKE 2 TABLETS TWICE  A DAY FOR DIABETES 360 tablet 2  . naproxen (NAPROSYN) 500 MG tablet Take 500 mg by mouth 2 (two) times daily with a meal. Takes bid    . ONE TOUCH ULTRA TEST test strip USE TO TEST BLOOD SUGAR 3 TIMES DAILY 100 each 3  . ONETOUCH DELICA LANCETS 82L MISC USE TO CHECK SUGAR DAILY 100 each 0  . vitamin C (ASCORBIC ACID) 500 MG tablet Take 500 mg by mouth 3 (three) times daily as needed (immune support).    Marland Kitchen testosterone cypionate (DEPOTESTOTERONE CYPIONATE) 200 MG/ML injection INJECT 2ML INTRAMUSCULARLY EVERY 2 WEEK AS DIRECTED 10 mL 1    No current facility-administered medications on file prior to visit.   Medical History:  Past Medical History  Diagnosis Date  . Hypertension   . Hyperlipidemia   . Diabetes mellitus without complication   . DDD (degenerative disc disease)   . Vitamin D deficiency   . Hypogonadism male   . GERD (gastroesophageal reflux disease)    Allergies: No Known Allergies   Review of Systems:  Review of Systems  Constitutional: Negative.   HENT: Negative.   Eyes: Negative.   Respiratory: Negative.   Cardiovascular: Negative.   Gastrointestinal: Negative.   Genitourinary: Negative.   Musculoskeletal: Negative.   Skin: Negative.   Neurological: Negative.   Endo/Heme/Allergies: Negative.   Psychiatric/Behavioral: Negative.     Family history- Review and unchanged Social history- Review and unchanged Physical Exam: BP 138/78 mmHg  Pulse 68  Temp(Src) 97.9 F (36.6 C)  Resp 16  Wt 178 lb (80.74 kg) Wt Readings from Last 3 Encounters:  07/20/14 178 lb (80.74 kg)  04/15/14 185 lb 6.4 oz (84.097 kg)  01/14/14 191 lb (86.637 kg)   General Appearance: Well nourished, in no apparent distress. Eyes: PERRLA, EOMs, conjunctiva no swelling or erythema Sinuses: No Frontal/maxillary tenderness ENT/Mouth: Ext aud canals clear, TMs without erythema, bulging. No erythema, swelling, or exudate on post pharynx.  Tonsils not swollen or erythematous. Hearing normal.  Neck: Supple, thyroid normal.  Respiratory: Respiratory effort normal, BS equal bilaterally without rales, rhonchi, wheezing or stridor.  Cardio: RRR with no MRGs. Brisk peripheral pulses without edema.  Abdomen: Soft, + BS.  Non tender, no guarding, rebound, hernias, masses. Lymphatics: Non tender without lymphadenopathy.  Musculoskeletal: Full ROM, 5/5 strength, Normal  gait Skin: Warm, dry without rashes, lesions, ecchymosis.  Neuro: Cranial nerves intact. No cerebellar symptoms.  Psych: Awake and oriented X 3, normal affect,  Insight and Judgment appropriate.    Vicie Mutters, PA-C 4:32 PM [facility]

## 2014-07-21 LAB — LIPID PANEL
CHOL/HDL RATIO: 6.1 ratio
Cholesterol: 122 mg/dL (ref 0–200)
HDL: 20 mg/dL — ABNORMAL LOW (ref >=40)
TRIGLYCERIDES: 420 mg/dL — AB (ref <150)

## 2014-07-21 LAB — CBC WITH DIFFERENTIAL/PLATELET
BASOS ABS: 0 10*3/uL (ref 0.0–0.1)
Basophils Relative: 0 % (ref 0–1)
Eosinophils Absolute: 0.1 10*3/uL (ref 0.0–0.7)
Eosinophils Relative: 1 % (ref 0–5)
HCT: 47.8 % (ref 39.0–52.0)
Hemoglobin: 16.1 g/dL (ref 13.0–17.0)
Lymphocytes Relative: 40 % (ref 12–46)
Lymphs Abs: 4 10*3/uL (ref 0.7–4.0)
MCH: 30 pg (ref 26.0–34.0)
MCHC: 33.7 g/dL (ref 30.0–36.0)
MCV: 89.2 fL (ref 78.0–100.0)
MONOS PCT: 9 % (ref 3–12)
MPV: 10.9 fL (ref 8.6–12.4)
Monocytes Absolute: 0.9 10*3/uL (ref 0.1–1.0)
Neutro Abs: 5 10*3/uL (ref 1.7–7.7)
Neutrophils Relative %: 50 % (ref 43–77)
Platelets: 262 10*3/uL (ref 150–400)
RBC: 5.36 MIL/uL (ref 4.22–5.81)
RDW: 14.4 % (ref 11.5–15.5)
WBC: 10 10*3/uL (ref 4.0–10.5)

## 2014-07-21 LAB — INSULIN, FASTING: INSULIN FASTING, SERUM: 9.3 u[IU]/mL (ref 2.0–19.6)

## 2014-07-21 LAB — BASIC METABOLIC PANEL WITH GFR
BUN: 23 mg/dL (ref 6–23)
CHLORIDE: 103 meq/L (ref 96–112)
CO2: 24 mEq/L (ref 19–32)
CREATININE: 1.09 mg/dL (ref 0.50–1.35)
Calcium: 9.4 mg/dL (ref 8.4–10.5)
GFR, Est Non African American: 78 mL/min
GLUCOSE: 71 mg/dL (ref 70–99)
Potassium: 4.7 mEq/L (ref 3.5–5.3)
SODIUM: 139 meq/L (ref 135–145)

## 2014-07-21 LAB — HEPATIC FUNCTION PANEL
ALK PHOS: 64 U/L (ref 39–117)
ALT: 23 U/L (ref 0–53)
AST: 23 U/L (ref 0–37)
Albumin: 4.7 g/dL (ref 3.5–5.2)
BILIRUBIN TOTAL: 0.4 mg/dL (ref 0.2–1.2)
Bilirubin, Direct: 0.1 mg/dL (ref 0.0–0.3)
Indirect Bilirubin: 0.3 mg/dL (ref 0.2–1.2)
TOTAL PROTEIN: 6.9 g/dL (ref 6.0–8.3)

## 2014-07-21 LAB — HEMOGLOBIN A1C
HEMOGLOBIN A1C: 6.1 % — AB (ref <5.7)
MEAN PLASMA GLUCOSE: 128 mg/dL — AB (ref <117)

## 2014-07-21 LAB — MAGNESIUM: Magnesium: 2 mg/dL (ref 1.5–2.5)

## 2014-07-21 LAB — VITAMIN D 25 HYDROXY (VIT D DEFICIENCY, FRACTURES): Vit D, 25-Hydroxy: 64 ng/mL (ref 30–100)

## 2014-07-21 LAB — TSH: TSH: 3.292 u[IU]/mL (ref 0.350–4.500)

## 2014-09-27 ENCOUNTER — Other Ambulatory Visit: Payer: Self-pay | Admitting: Physician Assistant

## 2014-09-27 ENCOUNTER — Other Ambulatory Visit: Payer: Self-pay | Admitting: Internal Medicine

## 2014-10-13 ENCOUNTER — Encounter: Payer: Self-pay | Admitting: Internal Medicine

## 2014-10-13 ENCOUNTER — Ambulatory Visit (INDEPENDENT_AMBULATORY_CARE_PROVIDER_SITE_OTHER): Payer: 59 | Admitting: Physician Assistant

## 2014-10-13 VITALS — BP 144/88 | HR 80 | Temp 97.5°F | Resp 16 | Ht 69.0 in | Wt 181.6 lb

## 2014-10-13 DIAGNOSIS — F909 Attention-deficit hyperactivity disorder, unspecified type: Secondary | ICD-10-CM

## 2014-10-13 DIAGNOSIS — L989 Disorder of the skin and subcutaneous tissue, unspecified: Secondary | ICD-10-CM

## 2014-10-13 DIAGNOSIS — F32A Depression, unspecified: Secondary | ICD-10-CM

## 2014-10-13 DIAGNOSIS — Z125 Encounter for screening for malignant neoplasm of prostate: Secondary | ICD-10-CM

## 2014-10-13 DIAGNOSIS — E782 Mixed hyperlipidemia: Secondary | ICD-10-CM

## 2014-10-13 DIAGNOSIS — F329 Major depressive disorder, single episode, unspecified: Secondary | ICD-10-CM

## 2014-10-13 DIAGNOSIS — M545 Low back pain: Secondary | ICD-10-CM

## 2014-10-13 DIAGNOSIS — Z0001 Encounter for general adult medical examination with abnormal findings: Secondary | ICD-10-CM

## 2014-10-13 DIAGNOSIS — F17200 Nicotine dependence, unspecified, uncomplicated: Secondary | ICD-10-CM | POA: Insufficient documentation

## 2014-10-13 DIAGNOSIS — N400 Enlarged prostate without lower urinary tract symptoms: Secondary | ICD-10-CM

## 2014-10-13 DIAGNOSIS — F988 Other specified behavioral and emotional disorders with onset usually occurring in childhood and adolescence: Secondary | ICD-10-CM

## 2014-10-13 DIAGNOSIS — Z1212 Encounter for screening for malignant neoplasm of rectum: Secondary | ICD-10-CM

## 2014-10-13 DIAGNOSIS — I10 Essential (primary) hypertension: Secondary | ICD-10-CM

## 2014-10-13 DIAGNOSIS — E349 Endocrine disorder, unspecified: Secondary | ICD-10-CM

## 2014-10-13 DIAGNOSIS — M5136 Other intervertebral disc degeneration, lumbar region: Secondary | ICD-10-CM

## 2014-10-13 DIAGNOSIS — Z79899 Other long term (current) drug therapy: Secondary | ICD-10-CM

## 2014-10-13 DIAGNOSIS — Z111 Encounter for screening for respiratory tuberculosis: Secondary | ICD-10-CM

## 2014-10-13 DIAGNOSIS — E559 Vitamin D deficiency, unspecified: Secondary | ICD-10-CM

## 2014-10-13 DIAGNOSIS — Z0000 Encounter for general adult medical examination without abnormal findings: Secondary | ICD-10-CM

## 2014-10-13 DIAGNOSIS — E1129 Type 2 diabetes mellitus with other diabetic kidney complication: Secondary | ICD-10-CM

## 2014-10-13 DIAGNOSIS — E291 Testicular hypofunction: Secondary | ICD-10-CM

## 2014-10-13 DIAGNOSIS — Z87442 Personal history of urinary calculi: Secondary | ICD-10-CM | POA: Insufficient documentation

## 2014-10-13 DIAGNOSIS — M51369 Other intervertebral disc degeneration, lumbar region without mention of lumbar back pain or lower extremity pain: Secondary | ICD-10-CM

## 2014-10-13 DIAGNOSIS — R3911 Hesitancy of micturition: Secondary | ICD-10-CM

## 2014-10-13 DIAGNOSIS — R6889 Other general symptoms and signs: Secondary | ICD-10-CM

## 2014-10-13 DIAGNOSIS — Z1159 Encounter for screening for other viral diseases: Secondary | ICD-10-CM

## 2014-10-13 MED ORDER — CIPROFLOXACIN HCL 500 MG PO TABS: 500.0000 mg | ORAL_TABLET | Freq: Two times a day (BID) | ORAL | Status: DC

## 2014-10-13 MED ORDER — TAMSULOSIN HCL 0.4 MG PO CAPS: 0.4000 mg | ORAL_CAPSULE | Freq: Every day | ORAL | Status: DC

## 2014-10-13 NOTE — Progress Notes (Signed)
Complete Physical  Assessment and Plan: 1. Essential hypertension - continue medications, add tamsulosin at night, DASH diet, exercise and monitor at home. Call if greater than 130/80.  - CBC with Differential/Platelet - BASIC METABOLIC PANEL WITH GFR - Hepatic function panel - TSH - Urinalysis, Routine w reflex microscopic (not at [facility]) - Microalbumin / creatinine urine ratio - EKG 12-Lead - [place], RETROPERITNL ABD,  LTD  2. 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 Despite this information patient still declines colonoscopy and understands risk of cancer and death.  Add bASA - Insulin, fasting - Hemoglobin A1c  3. Hyperlipidemia -continue medications, check lipids, decrease fatty foods, increase activity.  - Lipid panel  4. Testosterone deficiency - Testosterone  5. Medication management - Magnesium  6. Vitamin D deficiency - Vit D  25 hydroxy (rtn osteoporosis monitoring)  7. DDD (degenerative disc disease), lumbar controlled  8.  Screening for rectal cancer Negative in the office - POC Hemoccult Bld/Stl (1-Cd Office Dx)  9. Depression remission  10. ADD (attention deficit disorder) Continue medication PRN, only take 2 times a week   11. History of kidney stones ? Hesitancy from stones, will treat prostate first since tender prostate but if pain continues will get CT AB/Pelvis  12. Tobacco use disorder Smoking cessation-  instruction/counseling given, counseled patient on the dangers of tobacco use, advised patient to stop smoking, and reviewed strategies to maximize success - DG Chest 2 View; Future  13. Urinary hesitancy + tender prostate on exam, will treat with cipro and tamsulosin, check PSA, UA C&S.  If not better get Ct AB Pelvis If hematuria  with smoking will send urology and get Ct AB pelvis - Urine culture  14. Abnormality, skin On nose has small erythematous area x 1 week, put Vaseline on it if not better 1 month will refer to skin surgery center.   15. Prostatism Add tamsulosin, check labs.   69. Screening for prostate cancer - PSA  17. Screening for viral disease - RPR - HIV antibody - Hepatitis C antibody   Discussed med's effects and SE's. Screening labs and tests as requested with regular follow-up as recommended. Over 40 minutes of exam, counseling, chart review and critical decision making was performed  HPI Patient presents for a complete physical.   His blood pressure has been controlled at home, today their BP is BP: (!) 144/88 mmHg He does not workout. He denies chest pain, shortness of breath, dizziness.  He is not on cholesterol medication and denies myalgias. His cholesterol is not at goal. The cholesterol last visit was:   Lab Results  Component Value Date   CHOL 122 07/20/2014   HDL 20* 07/20/2014   LDLCALC NOT CALC 07/20/2014   TRIG 420* 07/20/2014   CHOLHDL 6.1 07/20/2014   He has been working on diet and exercise for diabetes x 2013 with CKD stage 2, he is on bASA, he is not on ACE/ARB and denies paresthesia of the feet, polydipsia, polyuria and visual disturbances. Last A1C in the office was:  Lab Results  Component Value Date   HGBA1C 6.1* 07/20/2014   Patient is on Vitamin D supplement.   Lab Results  Component Value Date   VD25OH 64 07/20/2014     Last PSA was:  Lab Results  Component Value Date   PSA 0.82 10/09/2013   He has smoked for 30 years, he is cutting back on smoking and wants to quit but has not.  He has history of kidney stones,  been having urgency with hesitation, weak stream  X 2 months, denies fever, chills. He is not sexually active. Has been on tamsulosin in the past. Did have acute right sided flank pain last night.  He has a history of testosterone deficiency  and is on testosterone replacement. He states that the testosterone helps with his energy, libido, muscle mass. Lab Results  Component Value Date   TESTOSTERONE 574 04/15/2014   He is on thyroid medication. His medication was not changed last visit.   Lab Results  Component Value Date   TSH 3.292 07/20/2014   He follows with Dr. Toy Care for ADD and depression but has not seen her in a few years.      Current Medications:  Current Outpatient Prescriptions on File Prior to Visit  Medication Sig Dispense Refill  . B-D 3CC LUER-LOK SYR 21GX1" 21G X 1" 3 ML MISC USE AS DIRECTED 20 each 0  . cetirizine (ZYRTEC) 10 MG tablet Take 10 mg by mouth daily.    . Cholecalciferol (VITAMIN D-3) 5000 UNITS TABS Take 10,000 Units by mouth daily.    . cyclobenzaprine (FLEXERIL) 10 MG tablet Take 10 mg by mouth daily as needed for muscle spasms. Takes prn    . dextroamphetamine (DEXEDRINE SPANSULE) 15 MG 24 hr capsule Take 15 mg by mouth daily as needed.    Marland Kitchen levothyroxine (SYNTHROID, LEVOTHROID) 50 MCG tablet TAKE 1 TABLET BY MOUTH ONCE DAILY BEFORE BREAKFAST 90 tablet 2  . metFORMIN (GLUCOPHAGE-XR) 500 MG 24 hr tablet TAKE 2 TABLETS TWICE A DAY FOR DIABETES 360 tablet 2  . naproxen (NAPROSYN) 500 MG tablet Take 500 mg by mouth 2 (two) times daily with a meal. Takes bid    . ONE TOUCH ULTRA TEST test strip TEST BLOOD SUGAR 3 TIMES A DAY 100 each 3  . ONETOUCH DELICA LANCETS 40J MISC USE TO CHECK SUGAR DAILY 100 each 0  . testosterone cypionate (DEPOTESTOTERONE CYPIONATE) 200 MG/ML injection USE 2ML INTRAMUSCULARLY EVERY 2 WEEKS AS DIRECTED 10 mL 1  . vitamin C (ASCORBIC ACID) 500 MG tablet Take 500 mg by mouth 3 (three) times daily as needed (immune support).     No current facility-administered medications on file prior to visit.   Health Maintenance:  Immunization History  Administered Date(s) Administered  . DTaP 04/04/2001, 04/24/2008  . Influenza Whole 01/09/2013  . PPD Test 10/09/2013  . Tdap  07/17/2013   Tetanus: 2015 Pneumovax: N/A Prevnar 13: N/A Flu vaccine: 2014 Zostavax: N/A DEXA: N/A Colonoscopy: 08/2013 EGD: N/A CXR: 2004 Ct AB 09/2013 + kidney stone MRI lumbar 2001 Eye Exam: yearly Dentist: q 6 months  Patient Care Team: [name], MD as PCP - General (Internal Medicine)  Dr. Olevia Perches, [facility].   Allergies: No Known Allergies Medical History:  Past Medical History  Diagnosis Date  . Hypertension   . Hyperlipidemia   . Diabetes mellitus without complication   . DDD (degenerative disc disease)   . Vitamin D deficiency   . Hypogonadism male   . GERD (gastroesophageal reflux disease)    Surgical History:  Past Surgical History  Procedure Laterality Date  . Lumbar disc surgery    . 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 Esophagogastroduodenoscopy  [id number]    duodenal ulcer  . Lumbar laminectomy  7/97  . Lumbar fusion  2001  . Cervical fusion  1991   Family History:  Family History  Problem Relation Age of Onset  . Hypertension Mother   . Stroke Mother   . Cancer Mother     ovarian  . Alcohol abuse Father   . Hypertension Father   . Diabetes Maternal Uncle   . Cancer Maternal Grandmother     lung  . Cancer Paternal Grandfather     lung  . Colon cancer Neg Hx   . Stomach cancer Neg Hx    Social History:   History  Substance Use Topics  . Smoking status: Current Every Day Smoker -- 0.75 packs/day for 30 years    Types: Cigarettes  . Smokeless tobacco: Never Used  . Alcohol Use: 2.4 oz/week    4 Standard drinks or equivalent per week   Review of Systems:  Review of Systems  Constitutional: Negative.  Negative for fever, weight loss, malaise/fatigue and diaphoresis.  HENT: Negative.   Eyes: Negative.   Respiratory: Negative.   Cardiovascular: Negative.   Gastrointestinal: Negative.   Genitourinary: Positive for urgency, frequency and flank pain (X 1 last night). Negative for dysuria and hematuria.   Musculoskeletal: Positive for back pain. Negative for myalgias, joint pain, falls and neck pain.  Skin: Negative.  Negative for rash.  Neurological: Negative.  Negative for weakness.  Psychiatric/Behavioral: Negative.     Physical Exam: Estimated body mass index is 26.81 kg/(m^2) as calculated from the following:   Height as of this encounter: 5\' 9"  (1.753 m).   Weight as of this encounter: 181 lb 9.6 oz (82.373 kg). BP 144/88 mmHg  Pulse 80  Temp(Src) 97.5 F (36.4 C)  Resp 16  Ht 5\' 9"  (1.753 m)  Wt 181 lb 9.6 oz (82.373 kg)  BMI 26.81 kg/m2 General Appearance: Well nourished, in no apparent distress.  Eyes: PERRLA, EOMs, conjunctiva no swelling or erythema, normal fundi and vessels.  Sinuses: No Frontal/maxillary tenderness  ENT/Mouth: Ext aud canals clear, normal light reflex with TMs without erythema, bulging. Good dentition. No erythema, swelling, or exudate on post pharynx. Tonsils not swollen or erythematous. Hearing normal.  Neck: Supple, thyroid normal. No bruits  Respiratory: Respiratory effort normal, BS equal bilaterally without rales, rhonchi, wheezing or stridor.  Cardio: RRR without murmurs, rubs or gallops. Brisk peripheral pulses without edema.  Chest: symmetric, with normal excursions and percussion.  Abdomen: Soft, nontender, no guarding, rebound, hernias, masses, or organomegaly.  Lymphatics: Non tender without lymphadenopathy.  Genitourinary: tender prostate without nodules Musculoskeletal: Full ROM all peripheral extremities,5/5 strength, and normal gait.  Skin: nonhealing ulcer on his nose. Warm, dry without rashes, lesions, ecchymosis. Neuro: Cranial nerves intact, reflexes equal bilaterally. Normal muscle tone, no cerebellar symptoms. Sensation intact.  Psych: Awake and oriented X 3, normal affect, Insight and Judgment appropriate.   EKG: WNL, T wave inversion v5,V6, no ST changes. AORTA SCAN: WNL  Vicie Mutters 4:28 PM [facility]

## 2014-10-13 NOTE — Addendum Note (Signed)
Addended by: Melbourne Abts C on: 10/13/2014 05:36 PM   Modules accepted: Orders

## 2014-10-13 NOTE — Patient Instructions (Addendum)
Add ENTERIC COATED low dose 81 mg Aspirin daily OR can do every other day if you have easy bruising to protect your heart and head. As well as to reduce risk of Colon Cancer by 20 %, Skin Cancer by 26 % , Melanoma by 46% and Pancreatic cancer by 60%  If you have a smart phone, please look up Smoke Free app, this will help you stay on track and give you information about money you have saved, life that you have gained back and a ton of more information.   We are giving you chantix for smoking cessation. You can do it! And we are here to help! You may have heard some scary side effects about chantix, the three most common I hear about are nausea, crazy dreams and depression.  However, I like for my patients to try to stay on 1/2 a tablet twice a day rather than one tablet twice a day as normally prescribed. This helps decrease the chances of side effects and helps save money by making a one month prescription last two months  Please start the prescription this way:  Start 1/2 tablet by mouth once daily after food with a full glass of water for 3 days Then do 1/2 tablet by mouth twice daily for 4 days. During this first week you can smoke, but try to stop after this week.  At this point we have several options: 1) continue on 1/2 tablet twice a day- which I encourage you to do. You can stay on this dose the rest of the time on the medication or if you still feel the need to smoke you can do one of the two options below. 2) do one tablet in the morning and 1/2 in the evening which helps decrease dreams. 3) do one tablet twice a day.   What if I miss a dose? If you miss a dose, take it as soon as you can. If it is almost time for your next dose, take only that dose. Do not take double or extra doses.  What should I watch for while using this medicine? Visit your doctor or health care professional for regular check ups. Ask for ongoing advice and encouragement from your doctor or healthcare  professional, friends, and family to help you quit. If you smoke while on this medication, quit again  Your mouth may get dry. Chewing sugarless gum or hard candy, and drinking plenty of water may help. Contact your doctor if the problem does not go away or is severe.  You may get drowsy or dizzy. Do not drive, use machinery, or do anything that needs mental alertness until you know how this medicine affects you. Do not stand or sit up quickly, especially if you are an older patient.   The use of this medicine may increase the chance of suicidal thoughts or actions. Pay special attention to how you are responding while on this medicine. Any worsening of mood, or thoughts of suicide or dying should be reported to your health care professional right away.  ADVANTAGES OF QUITTING SMOKING 1. Within 20 minutes, blood pressure decreases. Your pulse is at normal level. 2. After 8 hours, carbon monoxide levels in the blood return to normal. Your oxygen level increases. 3. After 24 hours, the chance of having a heart attack starts to decrease. Your breath, hair, and body stop smelling like smoke. 4. After 48 hours, damaged nerve endings begin to recover. Your sense of taste and smell improve. 5.  After 72 hours, the body is virtually free of nicotine. Your bronchial tubes relax and breathing becomes easier. 6. After 2 to 12 weeks, lungs can hold more air. Exercise becomes easier and circulation improves. 7. After 1 year, the risk of coronary heart disease is cut in half. 8. After 5 years, the risk of stroke falls to the same as a nonsmoker. 9. After 10 years, the risk of lung cancer is cut in half and the risk of other cancers decreases significantly. 10. After 15 years, the risk of coronary heart disease drops, usually to the level of a nonsmoker. 11. You will have extra money to spend on things other than cigarettes.     Bad carbs also include fruit juice, alcohol, and sweet tea. These are empty  calories that do not signal to your brain that you are full.   Please remember the good carbs are still carbs which convert into sugar. So please measure them out no more than 1/2-1 cup of rice, oatmeal, pasta, and beans  Veggies are however free foods! Pile them on.   Not all fruit is created equal. Please see the list below, the fruit at the bottom is higher in sugars than the fruit at the top. Please avoid all dried fruits.       Benign Prostatic Hyperplasia An enlarged prostate (benign prostatic hyperplasia) is common in older men. You may experience the following: 5. Weak urine stream. 6. Dribbling. 7. Feeling like the bladder has not emptied completely. 8. Difficulty starting urination. 9. Getting up frequently at night to urinate. 10. Urinating more frequently during the day. HOME CARE INSTRUCTIONS  Monitor your prostatic hyperplasia for any changes. The following actions may help to alleviate any discomfort you are experiencing:  Give yourself time when you urinate.  Stay away from alcohol.  Avoid beverages containing caffeine, such as coffee, tea, and colas, because they can make the problem worse.  Avoid decongestants, antihistamines, and some prescription medicines that can make the problem worse.  Follow up with your health care provider for further treatment as recommended. SEEK MEDICAL CARE IF:  You are experiencing progressive difficulty voiding.  Your urine stream is progressively getting narrower.  You are awaking from sleep with the urge to void more frequently.  You are constantly feeling the need to void.  You experience loss of urine, especially in small amounts. SEEK IMMEDIATE MEDICAL CARE IF:   You develop increased pain with urination or are unable to urinate.  You develop severe abdominal pain, vomiting, a high fever, or fainting.  You develop back pain or blood in your urine. MAKE SURE YOU:   Understand these instructions.  Will watch  your condition.  Will get help right away if you are not doing well or get worse. Document Released: 04/10/2005 Document Revised: 12/11/2012 Document Reviewed: 09/10/2012 [facility] Patient Information 2015 [place], [place]. This information is not intended to replace advice given to you by your health care provider. Make sure you discuss any questions you have with your health care provider.

## 2014-10-14 ENCOUNTER — Encounter: Payer: Self-pay | Admitting: Physician Assistant

## 2014-10-14 LAB — HEPATIC FUNCTION PANEL
ALK PHOS: 59 U/L (ref 39–117)
ALT: 16 U/L (ref 0–53)
AST: 16 U/L (ref 0–37)
Albumin: 4.2 g/dL (ref 3.5–5.2)
BILIRUBIN INDIRECT: 0.2 mg/dL (ref 0.2–1.2)
BILIRUBIN TOTAL: 0.3 mg/dL (ref 0.2–1.2)
Bilirubin, Direct: 0.1 mg/dL (ref 0.0–0.3)
TOTAL PROTEIN: 6.7 g/dL (ref 6.0–8.3)

## 2014-10-14 LAB — CBC WITH DIFFERENTIAL/PLATELET
Basophils Absolute: 0 10*3/uL (ref 0.0–0.1)
Basophils Relative: 0 % (ref 0–1)
EOS ABS: 0.1 10*3/uL (ref 0.0–0.7)
EOS PCT: 1 % (ref 0–5)
HCT: 47.7 % (ref 39.0–52.0)
Hemoglobin: 16.3 g/dL (ref 13.0–17.0)
Lymphocytes Relative: 32 % (ref 12–46)
Lymphs Abs: 3.1 10*3/uL (ref 0.7–4.0)
MCH: 30.1 pg (ref 26.0–34.0)
MCHC: 34.2 g/dL (ref 30.0–36.0)
MCV: 88.2 fL (ref 78.0–100.0)
MONOS PCT: 8 % (ref 3–12)
MPV: 10.8 fL (ref 8.6–12.4)
Monocytes Absolute: 0.8 10*3/uL (ref 0.1–1.0)
NEUTROS ABS: 5.7 10*3/uL (ref 1.7–7.7)
NEUTROS PCT: 59 % (ref 43–77)
PLATELETS: 225 10*3/uL (ref 150–400)
RBC: 5.41 MIL/uL (ref 4.22–5.81)
RDW: 14.8 % (ref 11.5–15.5)
WBC: 9.7 10*3/uL (ref 4.0–10.5)

## 2014-10-14 LAB — URINALYSIS, ROUTINE W REFLEX MICROSCOPIC
Bilirubin Urine: NEGATIVE
GLUCOSE, UA: NEGATIVE mg/dL
Hgb urine dipstick: NEGATIVE
Ketones, ur: NEGATIVE mg/dL
Leukocytes, UA: NEGATIVE
Nitrite: NEGATIVE
PROTEIN: NEGATIVE mg/dL
Specific Gravity, Urine: 1.014 (ref 1.005–1.030)
Urobilinogen, UA: 0.2 mg/dL (ref 0.0–1.0)
pH: 6 (ref 5.0–8.0)

## 2014-10-14 LAB — BASIC METABOLIC PANEL WITH GFR
BUN: 18 mg/dL (ref 6–23)
CALCIUM: 10.2 mg/dL (ref 8.4–10.5)
CO2: 26 meq/L (ref 19–32)
Chloride: 105 mEq/L (ref 96–112)
Creat: 1.13 mg/dL (ref 0.50–1.35)
GFR, Est African American: 87 mL/min
GFR, Est Non African American: 75 mL/min
GLUCOSE: 85 mg/dL (ref 70–99)
Potassium: 4.3 mEq/L (ref 3.5–5.3)
SODIUM: 142 meq/L (ref 135–145)

## 2014-10-14 LAB — MICROALBUMIN / CREATININE URINE RATIO
Creatinine, Urine: 227.8 mg/dL
Microalb Creat Ratio: 13.6 mg/g (ref 0.0–30.0)
Microalb, Ur: 3.1 mg/dL — ABNORMAL HIGH (ref <2.0)

## 2014-10-14 LAB — PSA: PSA: 0.89 ng/mL (ref <=4.00)

## 2014-10-14 LAB — LIPID PANEL
CHOLESTEROL: 95 mg/dL (ref 0–200)
HDL: 22 mg/dL — ABNORMAL LOW (ref >=40)
LDL Cholesterol: 30 mg/dL (ref 0–99)
TRIGLYCERIDES: 214 mg/dL — AB (ref <150)
Total CHOL/HDL Ratio: 4.3 Ratio
VLDL: 43 mg/dL — ABNORMAL HIGH (ref 0–40)

## 2014-10-14 LAB — HEPATITIS C ANTIBODY: HCV Ab: NEGATIVE

## 2014-10-14 LAB — INSULIN, FASTING: INSULIN FASTING, SERUM: 22.1 u[IU]/mL — AB (ref 2.0–19.6)

## 2014-10-14 LAB — HEMOGLOBIN A1C
HEMOGLOBIN A1C: 6.1 % — AB (ref <5.7)
MEAN PLASMA GLUCOSE: 128 mg/dL — AB (ref <117)

## 2014-10-14 LAB — URIC ACID: Uric Acid, Serum: 4.4 mg/dL (ref 4.0–7.8)

## 2014-10-14 LAB — TSH: TSH: 2.318 u[IU]/mL (ref 0.350–4.500)

## 2014-10-14 LAB — HIV ANTIBODY (ROUTINE TESTING W REFLEX): HIV 1&2 Ab, 4th Generation: NONREACTIVE

## 2014-10-14 LAB — RPR

## 2014-10-14 LAB — MAGNESIUM: Magnesium: 2.2 mg/dL (ref 1.5–2.5)

## 2014-10-14 LAB — TESTOSTERONE: TESTOSTERONE: 1151 ng/dL — AB (ref 300–890)

## 2014-10-14 LAB — VITAMIN D 25 HYDROXY (VIT D DEFICIENCY, FRACTURES): VIT D 25 HYDROXY: 95 ng/mL (ref 30–100)

## 2014-10-15 LAB — URINE CULTURE
Colony Count: NO GROWTH
ORGANISM ID, BACTERIA: NO GROWTH

## 2014-10-22 ENCOUNTER — Telehealth: Payer: Self-pay

## 2014-10-22 NOTE — Telephone Encounter (Signed)
Patient called requesting Rx for Oxycodone. Pt states that Dr.Woodruff prescribed for kidney stones. Pt c/o LBP and is wanting Rx to deal with pain. Per Dr.McKeown, pt needs to call doctor he sees for back. Pt aware.

## 2015-01-18 ENCOUNTER — Ambulatory Visit: Payer: Commercial Managed Care - HMO | Admitting: Physician Assistant

## 2015-01-18 DIAGNOSIS — I10 Essential (primary) hypertension: Secondary | ICD-10-CM

## 2015-01-18 DIAGNOSIS — E349 Endocrine disorder, unspecified: Secondary | ICD-10-CM

## 2015-01-18 DIAGNOSIS — E559 Vitamin D deficiency, unspecified: Secondary | ICD-10-CM

## 2015-01-18 DIAGNOSIS — Z79899 Other long term (current) drug therapy: Secondary | ICD-10-CM

## 2015-01-18 DIAGNOSIS — E782 Mixed hyperlipidemia: Secondary | ICD-10-CM

## 2015-01-18 DIAGNOSIS — E1129 Type 2 diabetes mellitus with other diabetic kidney complication: Secondary | ICD-10-CM

## 2015-01-18 DIAGNOSIS — F17200 Nicotine dependence, unspecified, uncomplicated: Secondary | ICD-10-CM

## 2015-01-18 LAB — CBC WITH DIFFERENTIAL/PLATELET
BASOS ABS: 0 10*3/uL (ref 0.0–0.1)
Basophils Relative: 0 % (ref 0–1)
Eosinophils Absolute: 0.1 10*3/uL (ref 0.0–0.7)
Eosinophils Relative: 1 % (ref 0–5)
HCT: 47.8 % (ref 39.0–52.0)
HEMOGLOBIN: 16.6 g/dL (ref 13.0–17.0)
LYMPHS ABS: 3.1 10*3/uL (ref 0.7–4.0)
LYMPHS PCT: 28 % (ref 12–46)
MCH: 30.5 pg (ref 26.0–34.0)
MCHC: 34.7 g/dL (ref 30.0–36.0)
MCV: 87.9 fL (ref 78.0–100.0)
MONO ABS: 1.1 10*3/uL — AB (ref 0.1–1.0)
MPV: 10.6 fL (ref 8.6–12.4)
Monocytes Relative: 10 % (ref 3–12)
Neutro Abs: 6.7 10*3/uL (ref 1.7–7.7)
Neutrophils Relative %: 61 % (ref 43–77)
Platelets: 224 10*3/uL (ref 150–400)
RBC: 5.44 MIL/uL (ref 4.22–5.81)
RDW: 14.3 % (ref 11.5–15.5)
WBC: 11 10*3/uL — ABNORMAL HIGH (ref 4.0–10.5)

## 2015-01-18 NOTE — Progress Notes (Signed)
Assessment and Plan:  1. Hypertension -Continue medication, monitor blood pressure at home. Continue DASH diet.  Reminder to go to the ER if any CP, SOB, nausea, dizziness, severe HA, changes vision/speech, left arm numbness and tingling and jaw pain.  2. Cholesterol -Continue diet and exercise. Check cholesterol.   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 Check A1C  4. Vitamin D Def - check level and continue medications.   5. Hypogonadism-  continue replacement therapy, check testosterone levels as needed.   6. Smoking cessation-  commended patient for quitting and reviewed strategies for preventing relapses, continue to try chantix  7. Hypothyroidism- check TSH level, continue medications the same, reminded to take on an empty stomach 30-12mins before food.   Continue diet and meds as discussed. Further disposition pending results of labs. Discussed med's effects and SE's.   Over 30 minutes of exam, counseling, chart review, and critical decision making was performed   HPI 54 y.o. male  presents for 3 month follow up on hypertension, cholesterol, diabetes and vitamin D deficiency.   His blood pressure has been controlled at home, today his BP is  .  He does not workout but is very active at work. He denies chest pain, shortness of breath, dizziness.  He is not on cholesterol medication and denies myalgias. His cholesterol is not at goal, LDL is at goal, his HDL and trigs are not at goal. The cholesterol was:   Lab Results  Component Value Date   CHOL 95 10/13/2014   HDL 22* 10/13/2014   LDLCALC 30 10/13/2014   TRIG 214* 10/13/2014   CHOLHDL 4.3 10/13/2014   He has been working on diet and exercise for diabetes with diabetic chronic kidney disease, he is not on bASA, he is not on ACE/ARB, and denies  paresthesia of the feet, polydipsia, polyuria and visual disturbances. Last A1C was:  Lab Results  Component Value Date   HGBA1C 6.1*  10/13/2014   Lab Results  Component Value Date   GFRNONAA 75 10/13/2014  Patient is on Vitamin D supplement. Lab Results  Component Value Date   VD25OH 95 10/13/2014  He is on thyroid medication. His medication was not changed last visit.   Lab Results  Component Value Date   TSH 2.318 10/13/2014  He has smoked for 30 years, but is doing chantix and has cut back and is planning on quitting when he sees Dr. Melford Aase in June/Sept.  He has a history of testosterone deficiency and is on testosterone replacement, 2 cc q 2 weeks.  He states that the testosterone has not really been helping him.  Lab Results  Component Value Date   TESTOSTERONE 1151* 10/13/2014    Current Medications:  Current Outpatient Prescriptions on File Prior to Visit  Medication Sig Dispense Refill  . B-D 3CC LUER-LOK SYR 21GX1" 21G X 1" 3 ML MISC USE AS DIRECTED 20 each 0  . cetirizine (ZYRTEC) 10 MG tablet Take 10 mg by mouth daily.    . Cholecalciferol (VITAMIN D-3) 5000 UNITS TABS Take 10,000 Units by mouth daily.    . ciprofloxacin (CIPRO) 500 MG tablet Take 1 tablet (500 mg total) by mouth 2 (two) times daily. 28 tablet 1  . cyclobenzaprine (FLEXERIL) 10 MG tablet Take 10 mg by mouth daily as needed for muscle spasms. Takes prn    . dextroamphetamine (DEXEDRINE SPANSULE) 15 MG 24 hr capsule Take 15 mg by mouth daily as needed.    Marland Kitchen  levothyroxine (SYNTHROID, LEVOTHROID) 50 MCG tablet TAKE 1 TABLET BY MOUTH ONCE DAILY BEFORE BREAKFAST 90 tablet 2  . metFORMIN (GLUCOPHAGE-XR) 500 MG 24 hr tablet TAKE 2 TABLETS TWICE A DAY FOR DIABETES 360 tablet 2  . naproxen (NAPROSYN) 500 MG tablet Take 500 mg by mouth 2 (two) times daily with a meal. Takes bid    . ONE TOUCH ULTRA TEST test strip TEST BLOOD SUGAR 3 TIMES A DAY 100 each 3  . ONETOUCH DELICA LANCETS 95K MISC USE TO CHECK SUGAR DAILY 100 each 0  . tamsulosin (FLOMAX) 0.4 MG CAPS capsule Take 1 capsule (0.4 mg total) by mouth daily after supper. 30 capsule 3  .  testosterone cypionate (DEPOTESTOTERONE CYPIONATE) 200 MG/ML injection USE 2ML INTRAMUSCULARLY EVERY 2 WEEKS AS DIRECTED 10 mL 1  . vitamin C (ASCORBIC ACID) 500 MG tablet Take 500 mg by mouth 3 (three) times daily as needed (immune support).     No current facility-administered medications on file prior to visit.   Medical History:  Past Medical History  Diagnosis Date  . Hypertension   . Hyperlipidemia   . Diabetes mellitus without complication   . DDD (degenerative disc disease)   . Vitamin D deficiency   . Hypogonadism male   . GERD (gastroesophageal reflux disease)    Allergies: No Known Allergies   Review of Systems:  Review of Systems  Constitutional: Negative.   HENT: Negative.   Eyes: Negative.   Respiratory: Negative.   Cardiovascular: Negative.   Gastrointestinal: Negative.   Genitourinary: Negative.   Musculoskeletal: Positive for back pain and joint pain. Negative for myalgias, falls and neck pain.  Skin: Negative.   Neurological: Negative.   Endo/Heme/Allergies: Negative.   Psychiatric/Behavioral: Negative.     Family history- Review and unchanged Social history- Review and unchanged Physical Exam: There were no vitals taken for this visit. Wt Readings from Last 3 Encounters:  10/13/14 181 lb 9.6 oz (82.373 kg)  07/20/14 178 lb (80.74 kg)  04/15/14 185 lb 6.4 oz (84.097 kg)   General Appearance: Well nourished, in no apparent distress. Eyes: PERRLA, EOMs, conjunctiva no swelling or erythema Sinuses: No Frontal/maxillary tenderness ENT/Mouth: Ext aud canals clear, TMs without erythema, bulging. No erythema, swelling, or exudate on post pharynx.  Tonsils not swollen or erythematous. Hearing normal.  Neck: Supple, thyroid normal.  Respiratory: Respiratory effort normal, BS equal bilaterally without rales, rhonchi, wheezing or stridor.  Cardio: RRR with no MRGs. Brisk peripheral pulses without edema.  Abdomen: Soft, + BS.  Non tender, no guarding, rebound,  hernias, masses. Lymphatics: Non tender without lymphadenopathy.  Musculoskeletal: Full ROM, 5/5 strength, Normal  gait Skin: Warm, dry without rashes, lesions, ecchymosis.  Neuro: Cranial nerves intact. No cerebellar symptoms.  Psych: Awake and oriented X 3, normal affect, Insight and Judgment appropriate.    Vicie Mutters, PA-C 3:56 PM [facility]

## 2015-01-19 LAB — LIPID PANEL
CHOL/HDL RATIO: 4.7 ratio (ref <=5.0)
CHOLESTEROL: 103 mg/dL — AB (ref 125–200)
HDL: 22 mg/dL — AB (ref >=40)
LDL Cholesterol: 38 mg/dL (ref <130)
Triglycerides: 216 mg/dL — ABNORMAL HIGH (ref <150)
VLDL: 43 mg/dL — AB (ref <30)

## 2015-01-19 LAB — BASIC METABOLIC PANEL WITH GFR
BUN: 18 mg/dL (ref 7–25)
CHLORIDE: 102 mmol/L (ref 98–110)
CO2: 26 mmol/L (ref 20–31)
Calcium: 9.3 mg/dL (ref 8.6–10.3)
Creat: 0.92 mg/dL (ref 0.70–1.33)
GFR, Est African American: >89 mL/min (ref >=60)
Glucose, Bld: 57 mg/dL — ABNORMAL LOW (ref 65–99)
Potassium: 4.2 mmol/L (ref 3.5–5.3)
SODIUM: 139 mmol/L (ref 135–146)

## 2015-01-19 LAB — HEMOGLOBIN A1C
HEMOGLOBIN A1C: 6 % — AB (ref <5.7)
MEAN PLASMA GLUCOSE: 126 mg/dL — AB (ref <117)

## 2015-01-19 LAB — HEPATIC FUNCTION PANEL
ALT: 18 U/L (ref 9–46)
AST: 20 U/L (ref 10–35)
Albumin: 4.4 g/dL (ref 3.6–5.1)
Alkaline Phosphatase: 69 U/L (ref 40–115)
BILIRUBIN DIRECT: 0.1 mg/dL (ref <=0.2)
BILIRUBIN INDIRECT: 0.2 mg/dL (ref 0.2–1.2)
Total Bilirubin: 0.3 mg/dL (ref 0.2–1.2)
Total Protein: 6.7 g/dL (ref 6.1–8.1)

## 2015-01-19 LAB — TSH: TSH: 2.415 u[IU]/mL (ref 0.350–4.500)

## 2015-01-19 LAB — VITAMIN D 25 HYDROXY (VIT D DEFICIENCY, FRACTURES): Vit D, 25-Hydroxy: 89 ng/mL (ref 30–100)

## 2015-01-19 LAB — INSULIN, FASTING: Insulin fasting, serum: 11.4 u[IU]/mL (ref 2.0–19.6)

## 2015-01-19 LAB — MAGNESIUM: Magnesium: 2.1 mg/dL (ref 1.5–2.5)

## 2015-10-28 ENCOUNTER — Encounter: Payer: Self-pay | Admitting: Internal Medicine

## 2015-11-17 ENCOUNTER — Encounter: Payer: Self-pay | Admitting: Internal Medicine

## 2015-11-17 ENCOUNTER — Ambulatory Visit (INDEPENDENT_AMBULATORY_CARE_PROVIDER_SITE_OTHER): Payer: 59 | Admitting: Internal Medicine

## 2015-11-17 VITALS — BP 130/90 | HR 60 | Temp 97.5°F | Resp 16 | Ht 70.0 in | Wt 181.2 lb

## 2015-11-17 DIAGNOSIS — G8929 Other chronic pain: Secondary | ICD-10-CM

## 2015-11-17 DIAGNOSIS — E349 Endocrine disorder, unspecified: Secondary | ICD-10-CM

## 2015-11-17 DIAGNOSIS — Z111 Encounter for screening for respiratory tuberculosis: Secondary | ICD-10-CM

## 2015-11-17 DIAGNOSIS — E1129 Type 2 diabetes mellitus with other diabetic kidney complication: Secondary | ICD-10-CM

## 2015-11-17 DIAGNOSIS — Z0001 Encounter for general adult medical examination with abnormal findings: Secondary | ICD-10-CM

## 2015-11-17 DIAGNOSIS — Z79899 Other long term (current) drug therapy: Secondary | ICD-10-CM

## 2015-11-17 DIAGNOSIS — E782 Mixed hyperlipidemia: Secondary | ICD-10-CM

## 2015-11-17 DIAGNOSIS — M545 Low back pain: Secondary | ICD-10-CM

## 2015-11-17 DIAGNOSIS — Z136 Encounter for screening for cardiovascular disorders: Secondary | ICD-10-CM

## 2015-11-17 DIAGNOSIS — Z1212 Encounter for screening for malignant neoplasm of rectum: Secondary | ICD-10-CM

## 2015-11-17 DIAGNOSIS — I10 Essential (primary) hypertension: Secondary | ICD-10-CM

## 2015-11-17 DIAGNOSIS — R5383 Other fatigue: Secondary | ICD-10-CM

## 2015-11-17 DIAGNOSIS — Z0000 Encounter for general adult medical examination without abnormal findings: Secondary | ICD-10-CM

## 2015-11-17 DIAGNOSIS — Z125 Encounter for screening for malignant neoplasm of prostate: Secondary | ICD-10-CM

## 2015-11-17 DIAGNOSIS — E559 Vitamin D deficiency, unspecified: Secondary | ICD-10-CM

## 2015-11-17 LAB — CBC WITH DIFFERENTIAL/PLATELET
BASOS PCT: 0 %
Basophils Absolute: 0 cells/uL (ref 0–200)
EOS ABS: 122 {cells}/uL (ref 15–500)
Eosinophils Relative: 1 %
HCT: 46.3 % (ref 38.5–50.0)
Hemoglobin: 15.7 g/dL (ref 13.2–17.1)
LYMPHS PCT: 29 %
Lymphs Abs: 3538 cells/uL (ref 850–3900)
MCH: 31.1 pg (ref 27.0–33.0)
MCHC: 33.9 g/dL (ref 32.0–36.0)
MCV: 91.7 fL (ref 80.0–100.0)
MONOS PCT: 5 %
MPV: 10.5 fL (ref 7.5–12.5)
Monocytes Absolute: 610 cells/uL (ref 200–950)
Neutro Abs: 7930 cells/uL — ABNORMAL HIGH (ref 1500–7800)
Neutrophils Relative %: 65 %
PLATELETS: 238 10*3/uL (ref 140–400)
RBC: 5.05 MIL/uL (ref 4.20–5.80)
RDW: 13.9 % (ref 11.0–15.0)
WBC: 12.2 10*3/uL — AB (ref 3.8–10.8)

## 2015-11-17 LAB — TESTOSTERONE: Testosterone: 683 ng/dL (ref 250–827)

## 2015-11-17 LAB — TSH: TSH: 2.61 mIU/L (ref 0.40–4.50)

## 2015-11-17 MED ORDER — BUPROPION HCL ER (XL) 300 MG PO TB24: 300.0000 mg | ORAL_TABLET | ORAL | 2 refills | Status: DC

## 2015-11-17 MED ORDER — TERBINAFINE HCL 250 MG PO TABS: 250.0000 mg | ORAL_TABLET | Freq: Every day | ORAL | 0 refills | Status: DC

## 2015-11-17 NOTE — Patient Instructions (Signed)
Preventive Care for Adults  A healthy lifestyle and preventive care can promote health and wellness. Preventive health guidelines for men include the following key practices:  A routine yearly physical is a good way to check with your health care provider about your health and preventative screening. It is a chance to share any concerns and updates on your health and to receive a thorough exam.  Visit your dentist for a routine exam and preventative care every 6 months. Brush your teeth twice a day and floss once a day. Good oral hygiene prevents tooth decay and gum disease.  The frequency of eye exams is based on your age, health, family medical history, use of contact lenses, and other factors. Follow your health care provider's recommendations for frequency of eye exams.  Eat a healthy diet. Foods such as vegetables, fruits, whole grains, low-fat dairy products, and lean protein foods contain the nutrients you need without too many calories. Decrease your intake of foods high in solid fats, added sugars, and salt. Eat the right amount of calories for you.Get information about a proper diet from your health care provider, if necessary.  Regular physical exercise is one of the most important things you can do for your health. Most adults should get at least 150 minutes of moderate-intensity exercise (any activity that increases your heart rate and causes you to sweat) each week. In addition, most adults need muscle-strengthening exercises on 2 or more days a week.  Maintain a healthy weight. The body mass index (BMI) is a screening tool to identify possible weight problems. It provides an estimate of body fat based on height and weight. Your health care provider can find your BMI and can help you achieve or maintain a healthy weight.For adults 20 years and older:  A BMI below 18.5 is considered underweight.  A BMI of 18.5 to 24.9 is normal.  A BMI of 25 to 29.9 is considered overweight.  A  BMI of 30 and above is considered obese.  Maintain normal blood lipids and cholesterol levels by exercising and minimizing your intake of saturated fat. Eat a balanced diet with plenty of fruit and vegetables. Blood tests for lipids and cholesterol should begin at age 20 and be repeated every 5 years. If your lipid or cholesterol levels are high, you are over 50, or you are at high risk for heart disease, you may need your cholesterol levels checked more frequently.Ongoing high lipid and cholesterol levels should be treated with medicines if diet and exercise are not working.  If you smoke, find out from your health care provider how to quit. If you do not use tobacco, do not start.  Lung cancer screening is recommended for adults aged 55-80 years who are at high risk for developing lung cancer because of a history of smoking. A yearly low-dose CT scan of the lungs is recommended for people who have at least a 30-pack-year history of smoking and are a current smoker or have quit within the past 15 years. A pack year of smoking is smoking an average of 1 pack of cigarettes a day for 1 year (for example: 1 pack a day for 30 years or 2 packs a day for 15 years). Yearly screening should continue until the smoker has stopped smoking for at least 15 years. Yearly screening should be stopped for people who develop a health problem that would prevent them from having lung cancer treatment.  If you choose to drink alcohol, do not have more   than 2 drinks per day. One drink is considered to be 12 ounces (355 mL) of beer, 5 ounces (148 mL) of wine, or 1.5 ounces (44 mL) of liquor.  Avoid use of street drugs. Do not share needles with anyone. Ask for help if you need support or instructions about stopping the use of drugs.  High blood pressure causes heart disease and increases the risk of stroke. Your blood pressure should be checked at least every 1-2 years. Ongoing high blood pressure should be treated with  medicines, if weight loss and exercise are not effective.  If you are 47-70 years old, ask your health care provider if you should take aspirin to prevent heart disease.  Diabetes screening involves taking a blood sample to check your fasting blood sugar level. This should be done once every 3 years, after age 4, if you are within normal weight and without risk factors for diabetes. Testing should be considered at a younger age or be carried out more frequently if you are overweight and have at least 1 risk factor for diabetes.  Colorectal cancer can be detected and often prevented. Most routine colorectal cancer screening begins at the age of 64 and continues through age 39. However, your health care provider may recommend screening at an earlier age if you have risk factors for colon cancer. On a yearly basis, your health care provider may provide home test kits to check for hidden blood in the stool. Use of a small camera at the end of a tube to directly examine the colon (sigmoidoscopy or colonoscopy) can detect the earliest forms of colorectal cancer. Talk to your health care provider about this at age 38, when routine screening begins. Direct exam of the colon should be repeated every 5-10 years through age 54, unless early forms of precancerous polyps or small growths are found.   Talk with your health care provider about prostate cancer screening.  Testicular cancer screening isrecommended for adult males. Screening includes self-exam, a health care provider exam, and other screening tests. Consult with your health care provider about any symptoms you have or any concerns you have about testicular cancer.  Use sunscreen. Apply sunscreen liberally and repeatedly throughout the day. You should seek shade when your shadow is shorter than you. Protect yourself by wearing long sleeves, pants, a wide-brimmed hat, and sunglasses year round, whenever you are outdoors.  Once a month, do a whole-body  skin exam, using a mirror to look at the skin on your back. Tell your health care provider about new moles, moles that have irregular borders, moles that are larger than a pencil eraser, or moles that have changed in shape or color.  Stay current with required vaccines (immunizations).  Influenza vaccine. All adults should be immunized every year.  Tetanus, diphtheria, and acellular pertussis (Td, Tdap) vaccine. An adult who has not previously received Tdap or who does not know his vaccine status should receive 1 dose of Tdap. This initial dose should be followed by tetanus and diphtheria toxoids (Td) booster doses every 10 years. Adults with an unknown or incomplete history of completing a 3-dose immunization series with Td-containing vaccines should begin or complete a primary immunization series including a Tdap dose. Adults should receive a Td booster every 10 years.  Varicella vaccine. An adult without evidence of immunity to varicella should receive 2 doses or a second dose if he has previously received 1 dose.  Human papillomavirus (HPV) vaccine. Males aged 13-21 years who have not  received the vaccine previously should receive the 3-dose series. Males aged 22-26 years may be immunized. Immunization is recommended through the age of 26 years for any male who has sex with males and did not get any or all doses earlier. Immunization is recommended for any person with an immunocompromised condition through the age of 26 years if he did not get any or all doses earlier. During the 3-dose series, the second dose should be obtained 4-8 weeks after the first dose. The third dose should be obtained 24 weeks after the first dose and 16 weeks after the second dose.  Zoster vaccine. One dose is recommended for adults aged 60 years or older unless certain conditions are present.    PREVNAR  - Pneumococcal 13-valent conjugate (PCV13) vaccine. When indicated, a person who is uncertain of his immunization  history and has no record of immunization should receive the PCV13 vaccine. An adult aged 19 years or older who has certain medical conditions and has not been previously immunized should receive 1 dose of PCV13 vaccine. This PCV13 should be followed with a dose of pneumococcal polysaccharide (PPSV23) vaccine. The PPSV23 vaccine dose should be obtained at least 8 weeks after the dose of PCV13 vaccine. An adult aged 19 years or older who has certain medical conditions and previously received 1 or more doses of PPSV23 vaccine should receive 1 dose of PCV13. The PCV13 vaccine dose should be obtained 1 or more years after the last PPSV23 vaccine dose.    PNEUMOVAX - Pneumococcal polysaccharide (PPSV23) vaccine. When PCV13 is also indicated, PCV13 should be obtained first. All adults aged 65 years and older should be immunized. An adult younger than age 65 years who has certain medical conditions should be immunized. Any person who resides in a nursing home or long-term care facility should be immunized. An adult smoker should be immunized. People with an immunocompromised condition and certain other conditions should receive both PCV13 and PPSV23 vaccines. People with human immunodeficiency virus (HIV) infection should be immunized as soon as possible after diagnosis. Immunization during chemotherapy or radiation therapy should be avoided. Routine use of PPSV23 vaccine is not recommended for American Indians, [place] Natives, or people younger than 65 years unless there are medical conditions that require PPSV23 vaccine. When indicated, people who have unknown immunization and have no record of immunization should receive PPSV23 vaccine. One-time revaccination 5 years after the first dose of PPSV23 is recommended for people aged 19-64 years who have chronic kidney failure, nephrotic syndrome, asplenia, or immunocompromised conditions. People who received 1-2 doses of PPSV23 before age 65 years should receive another  dose of PPSV23 vaccine at age 65 years or later if at least 5 years have passed since the previous dose. Doses of PPSV23 are not needed for people immunized with PPSV23 at or after age 65 years.    Hepatitis A vaccine. Adults who wish to be protected from this disease, have certain high-risk conditions, work with hepatitis A-infected animals, work in hepatitis A research labs, or travel to or work in countries with a high rate of hepatitis A should be immunized. Adults who were previously unvaccinated and who anticipate close contact with an international adoptee during the first 60 days after arrival in the [place] from a country with a high rate of hepatitis A should be immunized.    Hepatitis B vaccine. Adults should be immunized if they wish to be protected from this disease, have certain high-risk conditions, may be exposed to   blood or other infectious body fluids, are household contacts or sex partners of hepatitis B positive people, are clients or workers in certain care facilities, or travel to or work in countries with a high rate of hepatitis B.   Preventive Service / Frequency   Ages 47 to 81  Blood pressure check.  Lipid and cholesterol check  Lung cancer screening. / Every year if you are aged 19-80 years and have a 30-pack-year history of smoking and currently smoke or have quit within the past 15 years. Yearly screening is stopped once you have quit smoking for at least 15 years or develop a health problem that would prevent you from having lung cancer treatment.  Fecal occult blood test (FOBT) of stool. / Every year beginning at age 9 and continuing until age 66. You may not have to do this test if you get a colonoscopy every 10 years.  Flexible sigmoidoscopy** or colonoscopy.** / Every 5 years for a flexible sigmoidoscopy or every 10 years for a colonoscopy beginning at age 33 and continuing until age 34. Screening for abdominal aortic aneurysm (AAA)  by ultrasound is  recommended for people who have history of high blood pressure or who are current or former smokers.  +++++++++++++++++++++++++++++++  Recommend Adult Low Dose Aspirin or  coated  Aspirin 81 mg daily  To reduce risk of Colon Cancer 20 %,  Skin Cancer 26 % ,  Melanoma 46%  and  Pancreatic cancer 60% ++++++++++++++++++++++++++++++++++++++++++++++++++++++ Vitamin D goal  is between 70-100.  Please make sure that you are taking your Vitamin D as directed.  It is very important as a natural anti-inflammatory  helping hair, skin, and nails, as well as reducing stroke and heart attack risk.  It helps your bones and helps with mood. It also decreases numerous cancer risks so please take it as directed.  Low Vit D is associated with a 200-300% higher risk for CANCER  and 200-300% higher risk for HEART   ATTACK  &  STROKE.   .....................................Marland Kitchen It is also associated with higher death rate at younger ages,  autoimmune diseases like Rheumatoid arthritis, Lupus, Multiple Sclerosis.    Also many other serious conditions, like depression, Alzheimer's Dementia, infertility, muscle aches, fatigue, fibromyalgia - just to name a few. ++++++++++++++++++++++++++++++++++++++++++++++++ Recommend the book "The END of DIETING" by Dr Excell Seltzer  & the book "The END of DIABETES " by Dr Excell Seltzer At [email or web address] - get book & Audio CD's    Being diabetic has a  300% increased risk for heart attack, stroke, cancer, and alzheimer- type vascular dementia. It is very important that you work harder with diet by avoiding all foods that are white. Avoid white rice (brown & wild rice is OK), white potatoes (sweetpotatoes in moderation is OK), White bread or wheat bread or anything made out of white flour like bagels, donuts, rolls, buns, biscuits, cakes, pastries, cookies, pizza crust, and pasta (made from white flour & egg whites) - vegetarian pasta or spinach or wheat pasta is OK. Multigrain breads  like Arnold's or [employer], or multigrain sandwich thins or flatbreads.  Diet, exercise and weight loss can reverse and cure diabetes in the early stages.  Diet, exercise and weight loss is very important in the control and prevention of complications of diabetes which affects every system in your body, ie. Brain - dementia/stroke, eyes - glaucoma/blindness, heart - heart attack/heart failure, kidneys - dialysis, stomach - gastric paralysis, intestines - malabsorption, nerves - severe painful  neuritis, circulation - gangrene & loss of a leg(s), and finally cancer and Alzheimers.    I recommend avoid fried & greasy foods,  sweets/candy, white rice (brown or wild rice or Quinoa is OK), white potatoes (sweet potatoes are OK) - anything made from white flour - bagels, doughnuts, rolls, buns, biscuits,white and wheat breads, pizza crust and traditional pasta made of white flour & egg white(vegetarian pasta or spinach or wheat pasta is OK).  Multi-grain bread is OK - like multi-grain flat bread or sandwich thins. Avoid alcohol in excess. Exercise is also important.    Eat all the vegetables you want - avoid meat, especially red meat and dairy - especially cheese.  Cheese is the most concentrated form of trans-fats which is the worst thing to clog up our arteries. Veggie cheese is OK which can be found in the fresh produce section at [employer] or [employer] or [employer]  ++++++++++++++++++++++++++++++++++++++++++++++++++ DASH Eating Plan  DASH stands for "Dietary Approaches to Stop Hypertension."   The DASH eating plan is a healthy eating plan that has been shown to reduce high blood pressure (hypertension). Additional health benefits may include reducing the risk of type 2 diabetes mellitus, heart disease, and stroke. The DASH eating plan may also help with weight loss. WHAT DO I NEED TO KNOW ABOUT THE DASH EATING PLAN? For the DASH eating plan, you will follow these general guidelines:  Choose  foods with a percent daily value for sodium of less than 5% (as listed on the food label).  Use salt-free seasonings or herbs instead of table salt or sea salt.  Check with your health care provider or pharmacist before using salt substitutes.  Eat lower-sodium products, often labeled as "lower sodium" or "no salt added."  Eat fresh foods.  Eat more vegetables, fruits, and low-fat dairy products.  Choose whole grains. Look for the word "whole" as the first word in the ingredient list.  Choose fish   Limit sweets, desserts, sugars, and sugary drinks.  Choose heart-healthy fats.  Eat veggie cheese   Eat more home-cooked food and less restaurant, buffet, and fast food.  Limit fried foods.  Cook foods using methods other than frying.  Limit canned vegetables. If you do use them, rinse them well to decrease the sodium.  When eating at a restaurant, ask that your food be prepared with less salt, or no salt if possible.                      WHAT FOODS CAN I EAT? Read Dr Fara Olden Fuhrman's books on The End of Dieting & The End of Diabetes  Grains Whole grain or whole wheat bread. Brown rice. Whole grain or whole wheat pasta. Quinoa, bulgur, and whole grain cereals. Low-sodium cereals. Corn or whole wheat flour tortillas. Whole grain cornbread. Whole grain crackers. Low-sodium crackers.  Vegetables Fresh or frozen vegetables (raw, steamed, roasted, or grilled). Low-sodium or reduced-sodium tomato and vegetable juices. Low-sodium or reduced-sodium tomato sauce and paste. Low-sodium or reduced-sodium canned vegetables.   Fruits All fresh, canned (in natural juice), or frozen fruits.  Protein Products  All fish and seafood.  Dried beans, peas, or lentils. Unsalted nuts and seeds. Unsalted canned beans.  Dairy Low-fat dairy products, such as skim or 1% milk, 2% or reduced-fat cheeses, low-fat ricotta or cottage cheese, or plain low-fat yogurt. Low-sodium or reduced-sodium  cheeses.  Fats and Oils Tub margarines without trans fats. Light or reduced-fat mayonnaise and salad dressings (reduced  sodium). Avocado. Safflower, olive, or canola oils. Natural peanut or almond butter.  Other Unsalted popcorn and pretzels. The items listed above may not be a complete list of recommended foods or beverages. Contact your dietitian for more options.  +++++++++++++++++++++++++++++++++++++++++++  WHAT FOODS ARE NOT RECOMMENDED? Grains/ White flour or wheat flour White bread. White pasta. White rice. Refined cornbread. Bagels and croissants. Crackers that contain trans fat.  Vegetables  Creamed or fried vegetables. Vegetables in a . Regular canned vegetables. Regular canned tomato sauce and paste. Regular tomato and vegetable juices.  Fruits Dried fruits. Canned fruit in light or heavy syrup. Fruit juice.  Meat and Other Protein Products Meat in general - RED mwaet & White meat.  Fatty cuts of meat. Ribs, chicken wings, bacon, sausage, bologna, salami, chitterlings, fatback, hot dogs, bratwurst, and packaged luncheon meats.  Dairy Whole or 2% milk, cream, half-and-half, and cream cheese. Whole-fat or sweetened yogurt. Full-fat cheeses or blue cheese. Nondairy creamers and whipped toppings. Processed cheese, cheese spreads, or cheese curds.  Condiments Onion and garlic salt, seasoned salt, table salt, and sea salt. Canned and packaged gravies. Worcestershire sauce. Tartar sauce. Barbecue sauce. Teriyaki sauce. Soy sauce, including reduced sodium. Steak sauce. Fish sauce. Oyster sauce. Cocktail sauce. Horseradish. Ketchup and mustard. Meat flavorings and tenderizers. Bouillon cubes. Hot sauce. Tabasco sauce. Marinades. Taco seasonings. Relishes.  Fats and Oils Butter, stick margarine, lard, shortening and bacon fat. Coconut, palm kernel, or palm oils. Regular salad dressings.  Pickles and olives. Salted popcorn and pretzels.  The items listed above may not be a  complete list of foods and beverages to avoid.

## 2015-11-17 NOTE — Progress Notes (Signed)
[place] [facility]   [name], M.D.    [name], P.A.-C      Starlyn Skeans, P.A.-C   [facility]                [address] [place] [id number] Telephone [phone] Telefax [phone]  Annual  Screening/Preventative Visit And Comprehensive Evaluation & Examination     This very nice 55 y.o. DWM presents for a Wellness/Preventative Visit & comprehensive evaluation and management of multiple medical co-morbidities.  Patient has been followed for HTN, T2_NIDDM, Hyperlipidemia and Vitamin D Deficiency.He's also c/o dystrophic thickening of his Left toenails. Other problems include Chronic LBP due to DDD for which he's undergone surgery. Also patient is treated for ADD with good results on treatment.      HTN predates circa 2014. Patient's BP has been controlled at home.Today's BP: 130/90. Patient denies any cardiac symptoms as chest pain, palpitations, shortness of breath, dizziness or ankle swelling.     Patient's hyperlipidemia is controlled with diet and medications. Patient denies myalgias or other medication SE's. Last lipids were  Lab Results  Component Value Date   CHOL 103 (L) 01/18/2015   HDL 22 (L) 01/18/2015   LDLCALC 38 01/18/2015   TRIG 216 (H) 01/18/2015   CHOLHDL 4.7 01/18/2015      Patient has been on thyroid replacement since 2015. Patient has T2_NIDDM circa Sept 2013 and patient denies reactive hypoglycemic symptoms, visual blurring, diabetic polys or paresthesias. He admits checking CBG's about 1 x/week. Last A1c was  Lab Results  Component Value Date   HGBA1C 6.0 (H) 01/18/2015      Finally, patient has history of Vitamin D Deficiency of "33" in 2014 and last vitamin D was  Lab Results  Component Value Date   VD25OH 89 01/18/2015   Current Outpatient Prescriptions on File Prior to Visit  Medication Sig  . B-D 3CC LUER-LOK SYR 21GX1" 21G X 1" 3 ML MISC USE  AS DIRECTED  . cetirizine (ZYRTEC) 10 MG tablet Take 10 mg by mouth daily.  . Cholecalciferol (VITAMIN D-3) 5000 UNITS TABS Take 10,000 Units by mouth daily.  . cyclobenzaprine (FLEXERIL) 10 MG tablet Take 10 mg by mouth daily as needed for muscle spasms. Takes prn  . dextroamphetamine (DEXEDRINE SPANSULE) 15 MG 24 hr capsule Take 15 mg by mouth daily as needed.  Marland Kitchen levothyroxine (SYNTHROID, LEVOTHROID) 50 MCG tablet TAKE 1 TABLET BY MOUTH ONCE DAILY BEFORE BREAKFAST  . metFORMIN (GLUCOPHAGE-XR) 500 MG 24 hr tablet TAKE 2 TABLETS TWICE A DAY FOR DIABETES  . naproxen (NAPROSYN) 500 MG tablet Take 500 mg by mouth 2 (two) times daily with a meal. Takes bid  . ONE TOUCH ULTRA TEST test strip TEST BLOOD SUGAR 3 TIMES A DAY  . ONETOUCH DELICA LANCETS 99991111 MISC USE TO CHECK SUGAR DAILY  . testosterone cypionate (DEPOTESTOTERONE CYPIONATE) 200 MG/ML injection USE 2ML INTRAMUSCULARLY EVERY 2 WEEKS AS DIRECTED  . vitamin C (ASCORBIC ACID) 500 MG tablet Take 500 mg by mouth 3 (three) times daily as needed (immune support).   No current facility-administered medications on file prior to visit.    No Known Allergies Past Medical History:  Diagnosis Date  . DDD (degenerative disc disease)   . Diabetes mellitus without complication ([place])   . GERD (gastroesophageal reflux disease)   . Hyperlipidemia   . Hypertension   .  Hypogonadism male   . Vitamin D deficiency    Health Maintenance  Topic Date Due  . PNEUMOCOCCAL POLYSACCHARIDE VACCINE (1) 02/04/1965  . FOOT EXAM  10/10/2014  . OPHTHALMOLOGY EXAM  05/06/2015  . HEMOGLOBIN A1C  07/18/2015  . URINE MICROALBUMIN  10/13/2015  . INFLUENZA VACCINE  01/05/2016 (Originally 11/23/2015)  . TETANUS/TDAP  07/18/2023  . COLONOSCOPY  08/28/2023  . Hepatitis C Screening  Completed  . HIV Screening  Completed   Immunization History  Administered Date(s) Administered  . DTaP 04/04/2001, 04/24/2008  . Influenza Whole 01/09/2013  . PPD Test 10/09/2013,  10/13/2014, 11/17/2015  . Tdap 07/17/2013   Past Surgical History:  Procedure Laterality Date  . 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 CERVICAL FUSION  1991  . ESOPHAGOGASTRODUODENOSCOPY  1987,1992,1993,1995   duodenal ulcer  . LUMBAR DISC SURGERY    . LUMBAR FUSION  2001  . LUMBAR LAMINECTOMY  7/97   Family History  Problem Relation Age of Onset  . Hypertension Mother   . Stroke Mother   . Cancer Mother     ovarian  . Alcohol abuse Father   . Hypertension Father   . Diabetes Maternal Uncle   . Cancer Maternal Grandmother     lung  . Cancer Paternal Grandfather     lung  . Colon cancer Neg Hx   . Stomach cancer Neg Hx     Social History   Social History  . Marital status: Single    Spouse name: N/A  . Number of children: N/A  . Years of education: N/A   Occupational History  . Not on file.   Social History Main Topics  . Smoking status: Current Every Day Smoker    Packs/day: 0.75    Years: 30.00    Types: Cigarettes  . Smokeless tobacco: Never Used  . Alcohol use 2.4 oz/week    4 Standard drinks or equivalent per week  . Drug use: No  . Sexual activity: Not on file   Other Topics Concern  . Not on file   Social History Narrative  . No narrative on file    ROS Constitutional: Denies fever, chills, weight loss/gain, headaches, insomnia,  night sweats or change in appetite. Does c/o fatigue. Eyes: Denies redness, blurred vision, diplopia, discharge, itchy or watery eyes.  ENT: Denies discharge, congestion, post nasal drip, epistaxis, sore throat, earache, hearing loss, dental pain, Tinnitus, Vertigo, Sinus pain or snoring.  Cardio: Denies chest pain, palpitations, irregular heartbeat, syncope, dyspnea, diaphoresis, orthopnea, PND, claudication or edema Respiratory: denies cough, dyspnea, DOE, pleurisy, hoarseness, laryngitis or wheezing.  Gastrointestinal: Denies dysphagia, heartburn, reflux, water brash, pain, cramps, nausea, vomiting, bloating, diarrhea,  constipation, hematemesis, melena, hematochezia, jaundice or hemorrhoids Genitourinary: Denies dysuria, frequency, urgency, nocturia, hesitancy, discharge, hematuria or flank pain Musculoskeletal: Denies arthralgia, myalgia, stiffness, Jt. Swelling, pain, limp or strain/sprain. Denies Falls. Skin: Denies puritis, rash, hives, warts, acne, eczema or change in skin lesion Neuro: No weakness, tremor, incoordination, spasms, paresthesia or pain Psychiatric: Denies confusion, memory loss or sensory loss. Denies Depression. Endocrine: Denies change in weight, skin, hair change, nocturia, and paresthesia, diabetic polys, visual blurring or hyper / hypo glycemic episodes.  Heme/Lymph: No excessive bleeding, bruising or enlarged lymph nodes.  Physical Exam  BP 130/90   Pulse 60   Temp 97.5 F (36.4 C)   Resp 16   Ht 5\' 10"  (1.778 m)   Wt 181 lb 3.2 oz (82.2 kg)   BMI 26.00 kg/m  General Appearance: Well nourished, in no apparent distress. Eyes: PERRLA, EOMs, conjunctiva no swelling or erythema, normal fundi and vessels. Sinuses: No frontal/maxillary tenderness ENT/Mouth: EACs patent / TMs  nl. Nares clear without erythema, swelling, mucoid exudates. Oral hygiene is good. No erythema, swelling, or exudate. Tongue normal, non-obstructing. Tonsils not swollen or erythematous. Hearing normal.  Neck: Supple, thyroid normal. No bruits, nodes or JVD. Respiratory: Respiratory effort normal.  BS equal and clear bilateral without rales, rhonci, wheezing or stridor. Cardio: Heart sounds are normal with regular rate and rhythm and no murmurs, rubs or gallops. Peripheral pulses are normal and equal bilaterally without edema. No aortic or femoral bruits. Chest: symmetric with normal excursions and percussion.  Abdomen: Soft, with Nl bowel sounds. Nontender, no guarding, rebound, hernias, masses, or organomegaly.  Lymphatics: Non tender without lymphadenopathy.  Genitourinary: No hernias.Testes nl. DRE -  prostate nl for age - smooth & firm w/o nodules. Musculoskeletal: Full ROM all peripheral extremities, joint stability, 5/5 strength, and normal gait. Skin: Warm and dry without rashes, lesions, cyanosis, clubbing or  ecchymosis.  Neuro: Cranial nerves intact, reflexes equal bilaterally. Normal muscle tone, no cerebellar symptoms. Sensation intact.  Pysch: Alert and oriented X 3 with normal affect, insight and judgment appropriate.   Assessment and Plan  1. Annual Preventative/Screening Exam   - Microalbumin / creatinine urine ratio - EKG 12-Lead - POC Hemoccult Bld/Stl (3-Cd Home Screen); Future - Vitamin B12 - Iron and TIBC - PSA - Testosterone - CBC with Differential/Platelet - BASIC METABOLIC PANEL WITH GFR - Hepatic function panel - Magnesium - Lipid panel - TSH - Hemoglobin A1c - Insulin, random - VITAMIN D 25 Hydroxy  - [place], RETROPERITNL ABD,  LTD  2. Essential hypertension  - EKG 12-Lead - TSH - [place], RETROPERITNL ABD,  LTD  3. Hyperlipidemia  - EKG 12-Lead - Lipid panel - TSH - [place], RETROPERITNL ABD,  LTD  4. 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 Vitamin D deficiency  - VITAMIN D 25 Hydroxy   6. Testosterone deficiency   7. Chronic bilateral low back pain without sciatica   8. Encounter for screening for malignant neoplasm of rectum  - POC Hemoccult Bld/Stl   9. Prostate cancer screening  - PSA  10. Other fatigue  - Vitamin B12 - Iron and TIBC - Testosterone - CBC with Differential/Platelet  11. Screening for ischemic heart disease  - EKG 12-Lead  12. Screening for AAA (aortic abdominal aneurysm)  - [place], RETROPERITNL ABD,  LTD  13. Medication management  - CBC with Differential/Platelet - BASIC METABOLIC PANEL WITH GFR - Hepatic function panel - Magnesium  14. Screening examination for pulmonary  tuberculosis  - PPD  Continue prudent diet as discussed, weight control, BP monitoring, regular exercise, and medications as discussed.  Discussed med effects and SE's. Routine screening labs and tests as requested with regular follow-up as recommended. Over 40 minutes of exam, counseling, chart review and high complex critical decision making was performed

## 2015-11-18 LAB — HEPATIC FUNCTION PANEL
ALBUMIN: 4.9 g/dL (ref 3.6–5.1)
ALT: 37 U/L (ref 9–46)
AST: 26 U/L (ref 10–35)
Alkaline Phosphatase: 70 U/L (ref 40–115)
BILIRUBIN TOTAL: 0.3 mg/dL (ref 0.2–1.2)
Bilirubin, Direct: 0.1 mg/dL (ref <=0.2)
Indirect Bilirubin: 0.2 mg/dL (ref 0.2–1.2)
Total Protein: 7.5 g/dL (ref 6.1–8.1)

## 2015-11-18 LAB — LIPID PANEL
CHOLESTEROL: 147 mg/dL (ref 125–200)
HDL: 34 mg/dL — AB (ref >=40)
LDL Cholesterol: 52 mg/dL (ref <130)
Total CHOL/HDL Ratio: 4.3 Ratio (ref <=5.0)
Triglycerides: 305 mg/dL — ABNORMAL HIGH (ref <150)
VLDL: 61 mg/dL — ABNORMAL HIGH (ref <30)

## 2015-11-18 LAB — IRON AND TIBC
%SAT: 16 % (ref 15–60)
IRON: 62 ug/dL (ref 50–180)
TIBC: 389 ug/dL (ref 250–425)
UIBC: 327 ug/dL (ref 125–400)

## 2015-11-18 LAB — BASIC METABOLIC PANEL WITH GFR
BUN: 24 mg/dL (ref 7–25)
CALCIUM: 10 mg/dL (ref 8.6–10.3)
CO2: 25 mmol/L (ref 20–31)
CREATININE: 1.15 mg/dL (ref 0.70–1.33)
Chloride: 100 mmol/L (ref 98–110)
GFR, EST AFRICAN AMERICAN: 84 mL/min (ref >=60)
GFR, Est Non African American: 73 mL/min (ref >=60)
Glucose, Bld: 69 mg/dL (ref 65–99)
POTASSIUM: 4.7 mmol/L (ref 3.5–5.3)
Sodium: 137 mmol/L (ref 135–146)

## 2015-11-18 LAB — VITAMIN D 25 HYDROXY (VIT D DEFICIENCY, FRACTURES): VIT D 25 HYDROXY: 81 ng/mL (ref 30–100)

## 2015-11-18 LAB — PSA: PSA: 0.6 ng/mL (ref <=4.00)

## 2015-11-18 LAB — INSULIN, RANDOM: INSULIN: 8.3 u[IU]/mL (ref 2.0–19.6)

## 2015-11-18 LAB — HEMOGLOBIN A1C
Hgb A1c MFr Bld: 5.8 % — ABNORMAL HIGH (ref <5.7)
Mean Plasma Glucose: 120 mg/dL

## 2015-11-18 LAB — MICROALBUMIN / CREATININE URINE RATIO
CREATININE, URINE: 272 mg/dL (ref 20–370)
Microalb Creat Ratio: 12 mcg/mg creat (ref <30)
Microalb, Ur: 3.3 mg/dL

## 2015-11-18 LAB — VITAMIN B12: Vitamin B-12: 1653 pg/mL — ABNORMAL HIGH (ref 200–1100)

## 2015-11-18 LAB — MAGNESIUM: MAGNESIUM: 2.2 mg/dL (ref 1.5–2.5)

## 2015-11-20 ENCOUNTER — Encounter: Payer: Self-pay | Admitting: Internal Medicine

## 2015-11-22 LAB — TB SKIN TEST
INDURATION: 0 mm
TB SKIN TEST: NEGATIVE

## 2015-12-06 ENCOUNTER — Ambulatory Visit (HOSPITAL_COMMUNITY)
Admission: RE | Admit: 2015-12-06 | Discharge: 2015-12-06 | Disposition: A | Discharge status: Home / Self Care | Payer: 59 | Source: Ambulatory Visit | Attending: Physician Assistant | Admitting: Physician Assistant

## 2015-12-06 ENCOUNTER — Encounter: Payer: Self-pay | Admitting: Physician Assistant

## 2015-12-06 DIAGNOSIS — F17200 Nicotine dependence, unspecified, uncomplicated: Secondary | ICD-10-CM

## 2015-12-06 DIAGNOSIS — I10 Essential (primary) hypertension: Secondary | ICD-10-CM | POA: Diagnosis present

## 2015-12-06 DIAGNOSIS — J449 Chronic obstructive pulmonary disease, unspecified: Secondary | ICD-10-CM | POA: Insufficient documentation

## 2015-12-06 DIAGNOSIS — Z720 Tobacco use: Secondary | ICD-10-CM | POA: Diagnosis not present

## 2015-12-08 ENCOUNTER — Encounter: Payer: Self-pay | Admitting: Physician Assistant

## 2015-12-08 ENCOUNTER — Other Ambulatory Visit (INDEPENDENT_AMBULATORY_CARE_PROVIDER_SITE_OTHER): Payer: 59

## 2015-12-08 DIAGNOSIS — Z0001 Encounter for general adult medical examination with abnormal findings: Secondary | ICD-10-CM

## 2015-12-08 DIAGNOSIS — Z1212 Encounter for screening for malignant neoplasm of rectum: Secondary | ICD-10-CM

## 2015-12-08 LAB — POC HEMOCCULT BLD/STL (HOME/3-CARD/SCREEN)
Card #2 Fecal Occult Blod, POC: NEGATIVE
Card #3 Fecal Occult Blood, POC: NEGATIVE
Fecal Occult Blood, POC: NEGATIVE

## 2015-12-29 ENCOUNTER — Other Ambulatory Visit: Payer: 59

## 2015-12-29 ENCOUNTER — Other Ambulatory Visit: Payer: Self-pay | Admitting: Internal Medicine

## 2015-12-29 DIAGNOSIS — B351 Tinea unguium: Secondary | ICD-10-CM | POA: Insufficient documentation

## 2015-12-29 DIAGNOSIS — Z79899 Other long term (current) drug therapy: Secondary | ICD-10-CM

## 2015-12-30 LAB — HEPATIC FUNCTION PANEL
ALT: 16 U/L (ref 9–46)
AST: 17 U/L (ref 10–35)
Albumin: 4.3 g/dL (ref 3.6–5.1)
Alkaline Phosphatase: 65 U/L (ref 40–115)
BILIRUBIN DIRECT: 0.1 mg/dL (ref <=0.2)
BILIRUBIN INDIRECT: 0.2 mg/dL (ref 0.2–1.2)
BILIRUBIN TOTAL: 0.3 mg/dL (ref 0.2–1.2)
Total Protein: 6.9 g/dL (ref 6.1–8.1)

## 2016-01-12 ENCOUNTER — Other Ambulatory Visit: Payer: Self-pay | Admitting: Internal Medicine

## 2016-02-17 ENCOUNTER — Ambulatory Visit (INDEPENDENT_AMBULATORY_CARE_PROVIDER_SITE_OTHER): Payer: 59 | Admitting: Internal Medicine

## 2016-02-17 ENCOUNTER — Encounter: Payer: Self-pay | Admitting: Internal Medicine

## 2016-02-17 VITALS — BP 136/86 | HR 88 | Temp 98.2°F | Resp 18 | Ht 70.0 in | Wt 185.0 lb

## 2016-02-17 DIAGNOSIS — E559 Vitamin D deficiency, unspecified: Secondary | ICD-10-CM

## 2016-02-17 DIAGNOSIS — M5441 Lumbago with sciatica, right side: Secondary | ICD-10-CM

## 2016-02-17 DIAGNOSIS — E039 Hypothyroidism, unspecified: Secondary | ICD-10-CM

## 2016-02-17 DIAGNOSIS — M5442 Lumbago with sciatica, left side: Secondary | ICD-10-CM | POA: Diagnosis not present

## 2016-02-17 DIAGNOSIS — Z23 Encounter for immunization: Secondary | ICD-10-CM | POA: Diagnosis not present

## 2016-02-17 DIAGNOSIS — G8929 Other chronic pain: Secondary | ICD-10-CM | POA: Diagnosis not present

## 2016-02-17 DIAGNOSIS — E1129 Type 2 diabetes mellitus with other diabetic kidney complication: Secondary | ICD-10-CM

## 2016-02-17 DIAGNOSIS — E782 Mixed hyperlipidemia: Secondary | ICD-10-CM | POA: Diagnosis not present

## 2016-02-17 DIAGNOSIS — I10 Essential (primary) hypertension: Secondary | ICD-10-CM | POA: Diagnosis not present

## 2016-02-17 DIAGNOSIS — E349 Endocrine disorder, unspecified: Secondary | ICD-10-CM | POA: Diagnosis not present

## 2016-02-17 DIAGNOSIS — Z79899 Other long term (current) drug therapy: Secondary | ICD-10-CM | POA: Diagnosis not present

## 2016-02-17 LAB — CBC WITH DIFFERENTIAL/PLATELET
BASOS ABS: 0 {cells}/uL (ref 0–200)
Basophils Relative: 0 %
Eosinophils Absolute: 110 cells/uL (ref 15–500)
Eosinophils Relative: 1 %
HEMATOCRIT: 44.3 % (ref 38.5–50.0)
HEMOGLOBIN: 14.8 g/dL (ref 13.2–17.1)
LYMPHS ABS: 3190 {cells}/uL (ref 850–3900)
Lymphocytes Relative: 29 %
MCH: 30.1 pg (ref 27.0–33.0)
MCHC: 33.4 g/dL (ref 32.0–36.0)
MCV: 90.2 fL (ref 80.0–100.0)
MONO ABS: 880 {cells}/uL (ref 200–950)
MPV: 10.5 fL (ref 7.5–12.5)
Monocytes Relative: 8 %
NEUTROS PCT: 62 %
Neutro Abs: 6820 cells/uL (ref 1500–7800)
Platelets: 222 10*3/uL (ref 140–400)
RBC: 4.91 MIL/uL (ref 4.20–5.80)
RDW: 14 % (ref 11.0–15.0)
WBC: 11 10*3/uL — AB (ref 3.8–10.8)

## 2016-02-17 LAB — BASIC METABOLIC PANEL WITH GFR
BUN: 20 mg/dL (ref 7–25)
CALCIUM: 9.7 mg/dL (ref 8.6–10.3)
CHLORIDE: 105 mmol/L (ref 98–110)
CO2: 26 mmol/L (ref 20–31)
Creat: 1.1 mg/dL (ref 0.70–1.33)
GFR, EST NON AFRICAN AMERICAN: 76 mL/min (ref >=60)
GFR, Est African American: 88 mL/min (ref >=60)
GLUCOSE: 77 mg/dL (ref 65–99)
POTASSIUM: 4.3 mmol/L (ref 3.5–5.3)
Sodium: 138 mmol/L (ref 135–146)

## 2016-02-17 LAB — HEPATIC FUNCTION PANEL
ALK PHOS: 62 U/L (ref 40–115)
ALT: 27 U/L (ref 9–46)
AST: 22 U/L (ref 10–35)
Albumin: 4.3 g/dL (ref 3.6–5.1)
BILIRUBIN INDIRECT: 0.2 mg/dL (ref 0.2–1.2)
Bilirubin, Direct: 0.1 mg/dL (ref <=0.2)
TOTAL PROTEIN: 6.8 g/dL (ref 6.1–8.1)
Total Bilirubin: 0.3 mg/dL (ref 0.2–1.2)

## 2016-02-17 LAB — LIPID PANEL
Cholesterol: 120 mg/dL — ABNORMAL LOW (ref 125–200)
HDL: 30 mg/dL — ABNORMAL LOW (ref >=40)
LDL CALC: 63 mg/dL (ref <130)
Total CHOL/HDL Ratio: 4 Ratio (ref <=5.0)
Triglycerides: 135 mg/dL (ref <150)
VLDL: 27 mg/dL (ref <30)

## 2016-02-17 LAB — TSH: TSH: 2.37 m[IU]/L (ref 0.40–4.50)

## 2016-02-17 MED ORDER — HYDROCODONE-ACETAMINOPHEN 5-325 MG PO TABS: 2.0000 | ORAL_TABLET | ORAL | 0 refills | Status: DC | PRN

## 2016-02-17 NOTE — Patient Instructions (Signed)

## 2016-02-17 NOTE — Progress Notes (Deleted)
[place] [facility] [name], M.D.        [name], P.A.-C       Starlyn Skeans, P.A.-C  [facility]                [address] [place], [place] [phone] Telephone [phone] Telefax [phone] ______________________________________________________________________     This very nice 55 y.o. Single WM presents for 3 month follow up with Hypertension, Hyperlipidemia, Pre-Diabetes and Vitamin D Deficiency.      Patient is treated for HTN (2014)& BP has been controlled at home. Today's  . Patient has had no complaints of any cardiac type chest pain, palpitations, dyspnea/orthopnea/PND, dizziness, claudication, or dependent edema.     Hyperlipidemia is controlled with diet & meds. Patient denies myalgias or other med SE's. Last Lipids were at goal albeit elevated Trig's: Lab Results  Component Value Date   CHOL 147 11/17/2015   HDL 34 (L) 11/17/2015   LDLCALC 52 11/17/2015   TRIG 305 (H) 11/17/2015   CHOLHDL 4.3 11/17/2015      Also, the patient has history of T2_NIDDM (12/2011) and has had no symptoms of reactive hypoglycemia, diabetic polys, paresthesias or visual blurring.  Last A1c was not at goal: Lab Results  Component Value Date   HGBA1C 5.8 (H) 11/17/2015      Patient has been on Thyroid replacement since 2015.  Patient also has hx/o Low T and is on Testosterone injections. Further, the patient also has history of Vitamin D Deficiency in 2014 of "33"  and supplements vitamin D without any suspected side-effects. Last vitamin D was at goal: Lab Results  Component Value Date   VD25OH 58 11/17/2015   Current Outpatient Prescriptions on File Prior to Visit  Medication Sig  . WELLBUTRIN-XL 300 MG  Take 1 tablet (300 mg total) by mouth every morning.  . cetirizine  10 MG Take 10 mg by mouth daily.  . CHANTIX  1 MG  TAKE AS DIRECTED  . VITAMIN D 5000 UNITS  Take 10,000 Units by mouth  daily.  . cyclobenzaprine 10 MG  Take 10 mg by mouth daily as needed  . DEXEDRINE SPANSULE 15 MG  Take 15 mg by mouth daily as needed.  Marland Kitchen levothyroxine (SYNTHROID, LEVOTHROID) 50 MCG tablet TAKE 1 TAB ONCE DAILY BEFORE BREAKFAST  . metFORMIN-XR 500 MG 24 hr tablet TAKE 2 TAB TWICE A DAY FOR DIABETES  . naproxen  500 MG tablet Take  2  times daily with a meal.  . terbinafine  250 MG tablet Take 1 tab daily.  Marland Kitchen testosterone cypio 200 MG/ML injec USE 2 ML IM EVERY 2 WEEKS   . vitamin C  500 MG tablet Take  3  times daily    No Known Allergies PMHx:   Past Medical History:  Diagnosis Date  . DDD (degenerative disc disease)   . Diabetes mellitus without complication ([place])   . GERD (gastroesophageal reflux disease)   . Hyperlipidemia   . Hypertension   . Hypogonadism male   . Vitamin D deficiency    Immunization History  Administered Date(s) Administered  . DTaP 04/04/2001, 04/24/2008  . Influenza Whole 01/09/2013  . PPD Test 10/09/2013, 10/13/2014, 11/17/2015  . Tdap 07/17/2013   Past Surgical History:  Procedure Laterality Date  . 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 CERVICAL FUSION  1991  . ESOPHAGOGASTRODUODENOSCOPY  1987,1992,1993,1995   duodenal ulcer  . LUMBAR DISC SURGERY    . LUMBAR FUSION  2001  . LUMBAR LAMINECTOMY  7/97   FHx:    Reviewed / unchanged  SHx:    Reviewed / unchanged  Systems Review:  Constitutional: Denies fever, chills, wt changes, headaches, insomnia, fatigue, night sweats, change in appetite. Eyes: Denies redness, blurred vision, diplopia, discharge, itchy, watery eyes.  ENT: Denies discharge, congestion, post nasal drip, epistaxis, sore throat, earache, hearing loss, dental pain, tinnitus, vertigo, sinus pain, snoring.  CV: Denies chest pain, palpitations, irregular heartbeat, syncope, dyspnea, diaphoresis, orthopnea, PND, claudication or edema. Respiratory: denies cough, dyspnea, DOE, pleurisy, hoarseness, laryngitis, wheezing.  Gastrointestinal:  Denies dysphagia, odynophagia, heartburn, reflux, water brash, abdominal pain or cramps, nausea, vomiting, bloating, diarrhea, constipation, hematemesis, melena, hematochezia  or hemorrhoids. Genitourinary: Denies dysuria, frequency, urgency, nocturia, hesitancy, discharge, hematuria or flank pain. Musculoskeletal: Denies arthralgias, myalgias, stiffness, jt. swelling, pain, limping or strain/sprain.  Skin: Denies pruritus, rash, hives, warts, acne, eczema or change in skin lesion(s). Neuro: No weakness, tremor, incoordination, spasms, paresthesia or pain. Psychiatric: Denies confusion, memory loss or sensory loss. Endo: Denies change in weight, skin or hair change.  Heme/Lymph: No excessive bleeding, bruising or enlarged lymph nodes.  Physical Exam There were no vitals taken for this visit.  Appears well nourished and in no distress.  Eyes: PERRLA, EOMs, conjunctiva no swelling or erythema. Sinuses: No frontal/maxillary tenderness ENT/Mouth: EAC's clear, TM's nl w/o erythema, bulging. Nares clear w/o erythema, swelling, exudates. Oropharynx clear without erythema or exudates. Oral hygiene is good. Tongue normal, non obstructing. Hearing intact.  Neck: Supple. Thyroid nl. Car 2+/2+ without bruits, nodes or JVD. Chest: Respirations nl with BS clear & equal w/o rales, rhonchi, wheezing or stridor.  Cor: Heart sounds normal w/ regular rate and rhythm without sig. murmurs, gallops, clicks, or rubs. Peripheral pulses normal and equal  without edema.  Abdomen: Soft & bowel sounds normal. Non-tender w/o guarding, rebound, hernias, masses, or organomegaly.  Lymphatics: Unremarkable.  Musculoskeletal: Full ROM all peripheral extremities, joint stability, 5/5 strength, and normal gait.  Skin: Warm, dry without exposed rashes, lesions or ecchymosis apparent.  Neuro: Cranial nerves intact, reflexes equal bilaterally. Sensory-motor testing grossly intact. Tendon reflexes grossly intact.  Pysch: Alert &  oriented x 3.  Insight and judgement nl & appropriate. No ideations.  Assessment and Plan:  - Continue medication, monitor blood pressure at home. Continue DASH diet. Reminder to go to the ER if any CP, SOB, nausea, dizziness, severe HA, changes vision/speech, left arm numbness and tingling and jaw pain. - Continue diet/meds, exercise,& lifestyle modifications. Continue monitor periodic cholesterol/liver & renal functions   - Continue diet, exercise, lifestyle modifications. Monitor appropriate labs. - Continue supplementation.      Recommended regular exercise, BP monitoring, weight control, and discussed med and SE's. Recommended labs to assess and monitor clinical status. Further disposition pending results of labs. Over 30 minutes of exam, counseling, chart review was performed

## 2016-02-17 NOTE — Progress Notes (Signed)
Patient ID: Paul Gates, male   DOB: 03-17-1961, 55 y.o.   MRN: [id number]  Assessment and Plan:  Hypertension:  -Continue medication,  -monitor blood pressure at home.  -Continue DASH diet.   -Reminder to go to the ER if any CP, SOB, nausea, dizziness, severe HA, changes vision/speech, left arm numbness and tingling, and jaw pain.  Cholesterol: -Continue diet and exercise.  -Check cholesterol.   Pre-diabetes: -Continue diet and exercise.  -Check A1C  Vitamin D Def: -check level -continue medications.    Chronic low back pain -discussed new DEA prescribing laws -patient given #30 of norco after [place] drug database review -if requests start coming on medication more than every 3-4 months patient will need to see pain management   Hypothyroid -cont levothyroxine -tsh  Testosterone Def -cont injections Testosterone level  Continue diet and meds as discussed. Further disposition pending results of labs.  HPI 55 y.o. male  presents for 3 month follow up with hypertension, hyperlipidemia, prediabetes and vitamin D.   His blood pressure has been controlled at home, today their BP is BP: 136/86.   He does not workout. He denies chest pain, shortness of breath, dizziness.   He is on cholesterol medication and denies myalgias. His cholesterol is at goal. The cholesterol last visit was:   Lab Results  Component Value Date   CHOL 147 11/17/2015   HDL 34 (L) 11/17/2015   LDLCALC 52 11/17/2015   TRIG 305 (H) 11/17/2015   CHOLHDL 4.3 11/17/2015     He has been working on diet and exercise for prediabetes, and denies foot ulcerations, hyperglycemia, hypoglycemia , increased appetite, nausea, paresthesia of the feet, polydipsia, polyuria, visual disturbances, vomiting and weight loss. Last A1C in the office was:  Lab Results  Component Value Date   HGBA1C 5.8 (H) 11/17/2015    Patient is on Vitamin D supplement.  Lab Results  Component Value Date   VD25OH 33  11/17/2015     Patient reports that he is having some issues with lumbar back pain.  He has a history of L3-L4 lumbar fusion.  He reports that he is having chronic pain from this.  He reports that he would like to have a pain pill to take as needed for flexeril.  He reports that he has some flare ups and needs some pain medication.  He reports that when it flares it is pretty painful.     Current Medications:  Current Outpatient Prescriptions on File Prior to Visit  Medication Sig Dispense Refill  . B-D 3CC LUER-LOK SYR 21GX1" 21G X 1" 3 ML MISC USE AS DIRECTED 20 each 0  . buPROPion (WELLBUTRIN XL) 300 MG 24 hr tablet Take 1 tablet (300 mg total) by mouth every morning. 30 tablet 2  . cetirizine (ZYRTEC) 10 MG tablet Take 10 mg by mouth daily.    Hendricks Limes CONTINUING MONTH PAK 1 MG tablet TAKE AS DIRECTED 56 tablet 2  . Cholecalciferol (VITAMIN D-3) 5000 UNITS TABS Take 10,000 Units by mouth daily.    . cyclobenzaprine (FLEXERIL) 10 MG tablet Take 10 mg by mouth daily as needed for muscle spasms. Takes prn    . dextroamphetamine (DEXEDRINE SPANSULE) 15 MG 24 hr capsule Take 15 mg by mouth daily as needed.    Marland Kitchen levothyroxine (SYNTHROID, LEVOTHROID) 50 MCG tablet TAKE 1 TABLET BY MOUTH ONCE DAILY BEFORE BREAKFAST 90 tablet 2  . metFORMIN (GLUCOPHAGE-XR) 500 MG 24 hr tablet TAKE 2 TABLETS TWICE A DAY  FOR DIABETES 360 tablet 2  . naproxen (NAPROSYN) 500 MG tablet Take 500 mg by mouth 2 (two) times daily with a meal. Takes bid    . ONE TOUCH ULTRA TEST test strip TEST BLOOD SUGAR 3 TIMES A DAY 100 each 3  . ONETOUCH DELICA LANCETS 99991111 MISC USE TO CHECK SUGAR DAILY 100 each 0  . terbinafine (LAMISIL) 250 MG tablet Take 1 tablet (250 mg total) by mouth daily. 90 tablet 0  . testosterone cypionate (DEPOTESTOTERONE CYPIONATE) 200 MG/ML injection USE 2ML INTRAMUSCULARLY EVERY 2 WEEKS AS DIRECTED 10 mL 1  . vitamin C (ASCORBIC ACID) 500 MG tablet Take 500 mg by mouth 3 (three) times daily as needed  (immune support).     No current facility-administered medications on file prior to visit.     Medical History:  Past Medical History:  Diagnosis Date  . DDD (degenerative disc disease)   . Diabetes mellitus without complication ([place])   . GERD (gastroesophageal reflux disease)   . Hyperlipidemia   . Hypertension   . Hypogonadism male   . Vitamin D deficiency     Allergies: No Known Allergies   Review of Systems:  Review of Systems  Constitutional: Negative for chills, fever and malaise/fatigue.  HENT: Negative for congestion, ear pain and sore throat.   Eyes: Negative.   Respiratory: Negative for cough, shortness of breath and wheezing.   Cardiovascular: Negative for chest pain, palpitations and leg swelling.  Gastrointestinal: Negative for abdominal pain, blood in stool, constipation, diarrhea, heartburn and melena.  Genitourinary: Negative.   Musculoskeletal: Positive for back pain.  Skin: Negative.   Neurological: Negative for dizziness, sensory change, loss of consciousness and headaches.  Psychiatric/Behavioral: Negative for depression. The patient is not nervous/anxious and does not have insomnia.     Family history- Review and unchanged  Social history- Review and unchanged  Physical Exam: BP 136/86   Pulse 88   Temp 98.2 F (36.8 C) (Temporal)   Resp 18   Ht 5\' 10"  (1.778 m)   Wt 185 lb (83.9 kg)   BMI 26.54 kg/m  Wt Readings from Last 3 Encounters:  02/17/16 185 lb (83.9 kg)  11/17/15 181 lb 3.2 oz (82.2 kg)  10/13/14 181 lb 9.6 oz (82.4 kg)    General Appearance: Well nourished well developed, in no apparent distress. Eyes: PERRLA, EOMs, conjunctiva no swelling or erythema ENT/Mouth: Ear canals normal without obstruction, swelling, erythma, discharge.  TMs normal bilaterally.  Oropharynx moist, clear, without exudate, or postoropharyngeal swelling. Neck: Supple, thyroid normal,no cervical adenopathy  Respiratory: Respiratory effort normal, Breath  sounds clear A&P without rhonchi, wheeze, or rale.  No retractions, no accessory usage. Cardio: RRR with no MRGs. Brisk peripheral pulses without edema.  Abdomen: Soft, + BS,  Non tender, no guarding, rebound, hernias, masses. Musculoskeletal: Full ROM, 5/5 strength, Normal gait Skin: Warm, dry without rashes, lesions, ecchymosis.  Neuro: Awake and oriented X 3, Cranial nerves intact. Normal muscle tone, no cerebellar symptoms. Psych: Normal affect, Insight and Judgment appropriate.    Starlyn Skeans, PA-C 4:11 PM [facility]

## 2016-02-18 LAB — HEMOGLOBIN A1C
HEMOGLOBIN A1C: 5.5 % (ref <5.7)
MEAN PLASMA GLUCOSE: 111 mg/dL

## 2016-02-18 LAB — TESTOSTERONE: Testosterone: 552 ng/dL (ref 250–827)

## 2016-02-24 ENCOUNTER — Other Ambulatory Visit: Payer: Self-pay | Admitting: Internal Medicine

## 2016-02-24 ENCOUNTER — Other Ambulatory Visit: Payer: Self-pay | Admitting: Physician Assistant

## 2016-02-27 ENCOUNTER — Encounter: Payer: Self-pay | Admitting: *Deleted

## 2016-02-28 ENCOUNTER — Other Ambulatory Visit: Payer: Self-pay | Admitting: Internal Medicine

## 2016-03-06 ENCOUNTER — Other Ambulatory Visit: Payer: Self-pay | Admitting: Internal Medicine

## 2016-03-14 ENCOUNTER — Telehealth: Payer: Self-pay | Admitting: *Deleted

## 2016-03-14 ENCOUNTER — Other Ambulatory Visit: Payer: Self-pay | Admitting: *Deleted

## 2016-03-14 MED ORDER — TERBINAFINE HCL 250 MG PO TABS: 250.0000 mg | ORAL_TABLET | Freq: Every day | ORAL | 0 refills | Status: DC

## 2016-03-14 NOTE — Telephone Encounter (Signed)
Per Dr Melford Aase, the patient was informed to continue the Terbinafine 250 mg, but take for 1 month and then skip a month and # 90 should last for 6 months.

## 2016-04-27 DIAGNOSIS — I10 Essential (primary) hypertension: Secondary | ICD-10-CM | POA: Diagnosis not present

## 2016-04-28 DIAGNOSIS — I10 Essential (primary) hypertension: Secondary | ICD-10-CM | POA: Diagnosis not present

## 2016-04-28 DIAGNOSIS — E119 Type 2 diabetes mellitus without complications: Secondary | ICD-10-CM | POA: Diagnosis not present

## 2016-04-28 DIAGNOSIS — E039 Hypothyroidism, unspecified: Secondary | ICD-10-CM | POA: Diagnosis not present

## 2016-05-08 DIAGNOSIS — D229 Melanocytic nevi, unspecified: Secondary | ICD-10-CM | POA: Diagnosis not present

## 2016-05-08 DIAGNOSIS — L570 Actinic keratosis: Secondary | ICD-10-CM | POA: Diagnosis not present

## 2016-05-08 DIAGNOSIS — L821 Other seborrheic keratosis: Secondary | ICD-10-CM | POA: Diagnosis not present

## 2016-05-08 DIAGNOSIS — L720 Epidermal cyst: Secondary | ICD-10-CM | POA: Diagnosis not present

## 2016-05-12 DIAGNOSIS — I10 Essential (primary) hypertension: Secondary | ICD-10-CM | POA: Diagnosis not present

## 2016-05-23 ENCOUNTER — Ambulatory Visit (INDEPENDENT_AMBULATORY_CARE_PROVIDER_SITE_OTHER): Payer: 59 | Admitting: Internal Medicine

## 2016-05-23 VITALS — BP 132/80 | HR 76 | Temp 97.3°F | Resp 16 | Ht 70.0 in | Wt 193.4 lb

## 2016-05-23 DIAGNOSIS — E039 Hypothyroidism, unspecified: Secondary | ICD-10-CM

## 2016-05-23 DIAGNOSIS — E559 Vitamin D deficiency, unspecified: Secondary | ICD-10-CM | POA: Diagnosis not present

## 2016-05-23 DIAGNOSIS — E782 Mixed hyperlipidemia: Secondary | ICD-10-CM | POA: Diagnosis not present

## 2016-05-23 DIAGNOSIS — E1129 Type 2 diabetes mellitus with other diabetic kidney complication: Secondary | ICD-10-CM | POA: Diagnosis not present

## 2016-05-23 DIAGNOSIS — E349 Endocrine disorder, unspecified: Secondary | ICD-10-CM

## 2016-05-23 DIAGNOSIS — Z79899 Other long term (current) drug therapy: Secondary | ICD-10-CM | POA: Diagnosis not present

## 2016-05-23 DIAGNOSIS — I10 Essential (primary) hypertension: Secondary | ICD-10-CM

## 2016-05-23 LAB — CBC WITH DIFFERENTIAL/PLATELET
BASOS ABS: 0 {cells}/uL (ref 0–200)
Basophils Relative: 0 %
Eosinophils Absolute: 194 cells/uL (ref 15–500)
Eosinophils Relative: 2 %
HCT: 46.4 % (ref 38.5–50.0)
Hemoglobin: 15.5 g/dL (ref 13.2–17.1)
LYMPHS PCT: 30 %
Lymphs Abs: 2910 cells/uL (ref 850–3900)
MCH: 29.7 pg (ref 27.0–33.0)
MCHC: 33.4 g/dL (ref 32.0–36.0)
MCV: 88.9 fL (ref 80.0–100.0)
MONOS PCT: 9 %
MPV: 10.5 fL (ref 7.5–12.5)
Monocytes Absolute: 873 cells/uL (ref 200–950)
NEUTROS PCT: 59 %
Neutro Abs: 5723 cells/uL (ref 1500–7800)
Platelets: 250 10*3/uL (ref 140–400)
RBC: 5.22 MIL/uL (ref 4.20–5.80)
RDW: 14.6 % (ref 11.0–15.0)
WBC: 9.7 10*3/uL (ref 3.8–10.8)

## 2016-05-23 LAB — TSH: TSH: 2.64 mIU/L (ref 0.40–4.50)

## 2016-05-23 NOTE — Patient Instructions (Signed)

## 2016-05-23 NOTE — Progress Notes (Signed)
[facility] Paul Gates, M.D.        Paul Gates. Paul Gates, P.A.-C       Starlyn Skeans, P.A.-C  [facility]                [address] [place], [place] [id number] Telephone [phone] Telefax [phone] ______________________________________________________________________     This very nice 56 y.o. DWM presents for 6 month follow up with Hypertension, Hyperlipidemia, Pre-Diabetes and Vitamin D Deficiency.      Patient is treated for HTN since circa 2014 and was recently started on low dose Losartan at the [facility] clinics & BP has been controlled at home. Today's BP is at goal - 132/80. Patient has had no complaints of any cardiac type chest pain, palpitations, dyspnea/orthopnea/PND, dizziness, claudication, or dependent edema.     Hyperlipidemia is controlled with diet & meds. Patient denies myalgias or other med SE's. Last Lipids were at goal: Lab Results  Component Value Date   CHOL 120 (L) 02/17/2016   HDL 30 (L) 02/17/2016   LDLCALC 63 02/17/2016   TRIG 135 02/17/2016   CHOLHDL 4.0 02/17/2016      Also, the patient has history of T2_NIDDM and on Tx since 12/2011 and has had no symptoms of reactive hypoglycemia, diabetic polys, paresthesias or visual blurring.  Last A1c was at goal:  Lab Results  Component Value Date   HGBA1C 5.5 02/17/2016      Patient has been on Thyroid replacement circa 2015. Further, the patient also has history of Vitamin D Deficiency of "33" in 2013  and supplements vitamin D without any suspected side-effects. Last vitamin D was at goal: Lab Results  Component Value Date   VD25OH 38 11/17/2015   Current Outpatient Prescriptions on File Prior to Visit  Medication Sig  . B-D 3CC LUER-LOK SYR 21GX1" 21G X 1" 3 ML MISC USE AS DIRECTED  . buPROPion (WELLBUTRIN XL) 300 MG 24 hr tablet TAKE 1 TABLET (300 MG TOTAL) BY MOUTH EVERY MORNING.  . cetirizine (ZYRTEC) 10 MG tablet  Take 10 mg by mouth daily.  Hendricks Limes CONTINUING MONTH PAK 1 MG tablet TAKE AS DIRECTED  . Cholecalciferol (VITAMIN D-3) 5000 UNITS TABS Take 10,000 Units by mouth daily.  . cyclobenzaprine (FLEXERIL) 10 MG tablet Take 10 mg by mouth daily as needed for muscle spasms. Takes prn  . dextroamphetamine (DEXEDRINE SPANSULE) 15 MG 24 hr capsule Take 15 mg by mouth daily as needed.  Marland Kitchen HYDROcodone-acetaminophen (NORCO) 5-325 MG tablet Take 2 tablets by mouth every 4 (four) hours as needed.  Marland Kitchen levothyroxine (SYNTHROID, LEVOTHROID) 50 MCG tablet TAKE 1 TABLET BY MOUTH ONCE DAILY BEFORE BREAKFAST  . metFORMIN (GLUCOPHAGE-XR) 500 MG 24 hr tablet TAKE 2 TABLETS TWICE A DAY FOR DIABETES  . naproxen (NAPROSYN) 500 MG tablet Take 500 mg by mouth 2 (two) times daily with a meal. Takes bid  . ONE TOUCH ULTRA TEST test strip TEST BLOOD SUGAR 3 TIMES A DAY  . ONETOUCH DELICA LANCETS 99991111 MISC USE TO CHECK SUGAR DAILY  . terbinafine (LAMISIL) 250 MG tablet Take 1 tablet (250 mg total) by mouth daily.  Marland Kitchen testosterone cypionate (DEPOTESTOTERONE CYPIONATE) 200 MG/ML injection USE 2ML INTRAMUSCULARLY EVERY 2 WEEKS AS DIRECTED  . vitamin C (ASCORBIC ACID) 500 MG tablet Take 500 mg by mouth 3 (three) times daily as needed (immune support).  No current facility-administered medications on file prior to visit.    No Known Allergies PMHx:   Past Medical History:  Diagnosis Date  . DDD (degenerative disc disease)   . Diabetes mellitus without complication ([place])   . GERD (gastroesophageal reflux disease)   . Hyperlipidemia   . Hypertension   . Hypogonadism male   . Vitamin D deficiency    Immunization History  Administered Date(s) Administered  . DTaP 04/04/2001, 04/24/2008  . Influenza Whole 01/09/2013  . Influenza,inj,quad, With Preservative 02/17/2016  . PPD Test 10/09/2013, 10/13/2014, 11/17/2015  . Tdap 07/17/2013   Past Surgical History:  Procedure Laterality Date  . 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  CERVICAL FUSION  1991  . ESOPHAGOGASTRODUODENOSCOPY  1987,1992,1993,1995   duodenal ulcer  . LUMBAR DISC SURGERY    . LUMBAR FUSION  2001  . LUMBAR LAMINECTOMY  7/97   FHx:    Reviewed / unchanged  SHx:    Reviewed / unchanged  Systems Review:  Constitutional: Denies fever, chills, wt changes, headaches, insomnia, fatigue, night sweats, change in appetite. Eyes: Denies redness, blurred vision, diplopia, discharge, itchy, watery eyes.  ENT: Denies discharge, congestion, post nasal drip, epistaxis, sore throat, earache, hearing loss, dental pain, tinnitus, vertigo, sinus pain, snoring.  CV: Denies chest pain, palpitations, irregular heartbeat, syncope, dyspnea, diaphoresis, orthopnea, PND, claudication or edema. Respiratory: denies cough, dyspnea, DOE, pleurisy, hoarseness, laryngitis, wheezing.  Gastrointestinal: Denies dysphagia, odynophagia, heartburn, reflux, water brash, abdominal pain or cramps, nausea, vomiting, bloating, diarrhea, constipation, hematemesis, melena, hematochezia  or hemorrhoids. Genitourinary: Denies dysuria, frequency, urgency, nocturia, hesitancy, discharge, hematuria or flank pain. Musculoskeletal: Denies arthralgias, myalgias, stiffness, jt. swelling, pain, limping or strain/sprain.  Skin: Denies pruritus, rash, hives, warts, acne, eczema or change in skin lesion(s). Neuro: No weakness, tremor, incoordination, spasms, paresthesia or pain. Psychiatric: Denies confusion, memory loss or sensory loss. Endo: Denies change in weight, skin or hair change.  Heme/Lymph: No excessive bleeding, bruising or enlarged lymph nodes.  Physical Exam  BP 132/80   Pulse 76   Temp 97.3 F (36.3 C)   Resp 16   Ht 5\' 10"  (1.778 m)   Wt 193 lb 6.4 oz (87.7 kg)   BMI 27.75 kg/m   Appears well nourished and in no distress.  Eyes: PERRLA, EOMs, conjunctiva no swelling or erythema. Sinuses: No frontal/maxillary tenderness ENT/Mouth: EAC's clear, TM's nl w/o erythema, bulging.  Nares clear w/o erythema, swelling, exudates. Oropharynx clear without erythema or exudates. Oral hygiene is good. Tongue normal, non obstructing. Hearing intact.  Neck: Supple. Thyroid nl. Car 2+/2+ without bruits, nodes or JVD. Chest: Respirations nl with BS clear & equal w/o rales, rhonchi, wheezing or stridor.  Cor: Heart sounds normal w/ regular rate and rhythm without sig. murmurs, gallops, clicks, or rubs. Peripheral pulses normal and equal  without edema.  Abdomen: Soft & bowel sounds normal. Non-tender w/o guarding, rebound, hernias, masses, or organomegaly.  Lymphatics: Unremarkable.  Musculoskeletal: Full ROM all peripheral extremities, joint stability, 5/5 strength, and normal gait.  Skin: Warm, dry without exposed rashes, lesions or ecchymosis apparent.  Neuro: Cranial nerves intact, reflexes equal bilaterally. Sensory-motor testing grossly intact. Tendon reflexes grossly intact.  Pysch: Alert & oriented x 3.  Insight and judgement nl & appropriate. No ideations.  Assessment and Plan:    1. Essential hypertension  - Continue medication, monitor blood pressure at home.  - Continue DASH diet. Reminder to go to the ER if any CP,  SOB, nausea, dizziness,  severe HA, changes vision/speech,  left arm numbness and tingling and jaw pain. - CBC with Differential/Platelet - BASIC METABOLIC PANEL WITH GFR - TSH  2. Hyperlipidemia  - Continue diet/meds, exercise,& lifestyle modifications.  - Continue monitor periodic cholesterol/liver & renal functions  - Hepatic function panel - Lipid panel - TSH  3. 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  - Monitor appropriate labs. - Hemoglobin A1c - Insulin, random  4. Vitamin D deficiency  - Continue supplementation. - VITAMIN D 25 Hydroxy   5. Testosterone deficiency   6. Acquired hypothyroidism   7. Medication management  - CBC with Differential/Platelet - BASIC  METABOLIC PANEL WITH GFR - Hepatic function panel - Magnesium - Lipid panel - TSH - VITAMIN D 25 Hydroxy        Recommended regular exercise, BP monitoring, weight control, and discussed med and SE's. Recommended labs to assess and monitor clinical status. Further disposition pending results of labs. Over 30 minutes of exam, counseling, chart review was performed

## 2016-05-24 ENCOUNTER — Encounter: Payer: Self-pay | Admitting: Internal Medicine

## 2016-05-24 LAB — HEPATIC FUNCTION PANEL
ALT: 23 U/L (ref 9–46)
AST: 19 U/L (ref 10–35)
Albumin: 4.5 g/dL (ref 3.6–5.1)
Alkaline Phosphatase: 69 U/L (ref 40–115)
BILIRUBIN DIRECT: 0.1 mg/dL (ref <=0.2)
Indirect Bilirubin: 0.2 mg/dL (ref 0.2–1.2)
TOTAL PROTEIN: 7.1 g/dL (ref 6.1–8.1)
Total Bilirubin: 0.3 mg/dL (ref 0.2–1.2)

## 2016-05-24 LAB — LIPID PANEL
Cholesterol: 154 mg/dL (ref <200)
HDL: 21 mg/dL — ABNORMAL LOW (ref >40)
Total CHOL/HDL Ratio: 7.3 Ratio — ABNORMAL HIGH (ref <5.0)
Triglycerides: 587 mg/dL — ABNORMAL HIGH (ref <150)

## 2016-05-24 LAB — BASIC METABOLIC PANEL WITH GFR
BUN: 22 mg/dL (ref 7–25)
CALCIUM: 9.5 mg/dL (ref 8.6–10.3)
CO2: 24 mmol/L (ref 20–31)
CREATININE: 1.24 mg/dL (ref 0.70–1.33)
Chloride: 104 mmol/L (ref 98–110)
GFR, Est African American: 76 mL/min (ref >=60)
GFR, Est Non African American: 66 mL/min (ref >=60)
Glucose, Bld: 84 mg/dL (ref 65–99)
Potassium: 4.4 mmol/L (ref 3.5–5.3)
SODIUM: 140 mmol/L (ref 135–146)

## 2016-05-24 LAB — VITAMIN D 25 HYDROXY (VIT D DEFICIENCY, FRACTURES): VIT D 25 HYDROXY: 61 ng/mL (ref 30–100)

## 2016-05-24 LAB — HEMOGLOBIN A1C
HEMOGLOBIN A1C: 5.9 % — AB (ref <5.7)
MEAN PLASMA GLUCOSE: 123 mg/dL

## 2016-05-24 LAB — INSULIN, RANDOM: Insulin: 44.2 u[IU]/mL — ABNORMAL HIGH (ref 2.0–19.6)

## 2016-05-24 LAB — MAGNESIUM: MAGNESIUM: 2.2 mg/dL (ref 1.5–2.5)

## 2016-05-25 ENCOUNTER — Other Ambulatory Visit: Payer: Self-pay | Admitting: Internal Medicine

## 2016-06-05 DIAGNOSIS — M5416 Radiculopathy, lumbar region: Secondary | ICD-10-CM | POA: Diagnosis not present

## 2016-06-20 DIAGNOSIS — M5416 Radiculopathy, lumbar region: Secondary | ICD-10-CM | POA: Diagnosis not present

## 2016-08-28 ENCOUNTER — Ambulatory Visit: Payer: Self-pay | Admitting: Internal Medicine

## 2016-08-28 NOTE — Progress Notes (Signed)
Assessment and Plan:   Hypertension -Continue medication, monitor blood pressure at home. Continue DASH diet.  Reminder to go to the ER if any CP, SOB, nausea, dizziness, severe HA, changes vision/speech, left arm numbness and tingling and jaw pain.   Cholesterol -Continue diet and exercise. Check cholesterol.    Diabetes with diabetic chronic kidney disease -Continue diet and exercise. Check A1C   Vitamin D Def - check level and continue medications.    Hypogonadism-  continue replacement therapy, check testosterone levels as needed.   Smoking cessation-  commended patient for quitting and reviewed strategies for preventing relapses, continue to try chantix  Hypothyroidism- check TSH level, continue medications the same, reminded to take on an empty stomach 30-67mins before food.   Tinnitus Treat fluid behind ears Not with pulse no imbalance, vision changes If not better will get MRA  Continue diet and meds as discussed. Further disposition pending results of labs. Discussed med's effects and SE's.   Over 30 minutes of exam, counseling, chart review, and critical decision making was performed Future Appointments Date Time Provider Brooke  12/27/2016 3:00 PM [name], MD [facility] None     HPI 56 y.o. male  presents for 3 month follow up on hypertension, cholesterol, diabetes and vitamin D deficiency.   His blood pressure has been controlled at home, today his BP is BP: 128/76.  States for 1 month has had abnormal tinnitus/whooshing noise in his ears with his feet steps, has push mowed the lawn with out tinnitus, no sinus issues, no decreased hearing, on zyrtec.    He does not workout but is very active at work. He denies chest pain, shortness of breath, dizziness.  He is not on cholesterol medication and denies myalgias. His cholesterol is not at goal, LDL is at goal, his HDL and trigs are not at goal. The cholesterol was:   Lab Results  Component Value  Date   CHOL 154 05/23/2016   HDL 21 (L) 05/23/2016   LDLCALC NOT CALC 05/23/2016   TRIG 587 (H) 05/23/2016   CHOLHDL 7.3 (H) 05/23/2016   He has been working on diet and exercise for diabetes with diabetic chronic kidney disease, he is not on bASA, he is not on ACE/ARB, and denies  paresthesia of the feet, polydipsia, polyuria and visual disturbances. Last A1C was:  Lab Results  Component Value Date   HGBA1C 5.9 (H) 05/23/2016   Lab Results  Component Value Date   GFRNONAA 66 05/23/2016  Patient is on Vitamin D supplement. Lab Results  Component Value Date   VD25OH 34 05/23/2016  He is on thyroid medication. His medication was not changed last visit.   Lab Results  Component Value Date   TSH 2.64 05/23/2016  He has smoked for 30 years, but is doing chantix and has cut back, has started back.  He has a history of testosterone deficiency and is on testosterone replacement, 2 cc q 2 weeks.  He states that the testosterone has not really been helping him.  Lab Results  Component Value Date   TESTOSTERONE 552 02/17/2016    Current Medications:  Current Outpatient Prescriptions on File Prior to Visit  Medication Sig Dispense Refill  . B-D 3CC LUER-LOK SYR 21GX1" 21G X 1" 3 ML MISC USE AS DIRECTED 20 each 0  . buPROPion (WELLBUTRIN XL) 300 MG 24 hr tablet TAKE 1 TABLET (300 MG TOTAL) BY MOUTH EVERY MORNING. 30 tablet 2  . cetirizine (ZYRTEC) 10 MG tablet Take  10 mg by mouth daily.    Hendricks Limes CONTINUING MONTH PAK 1 MG tablet TAKE AS DIRECTED 56 tablet 2  . Cholecalciferol (VITAMIN D-3) 5000 UNITS TABS Take 10,000 Units by mouth daily.    . cyclobenzaprine (FLEXERIL) 10 MG tablet Take 10 mg by mouth daily as needed for muscle spasms. Takes prn    . dextroamphetamine (DEXEDRINE SPANSULE) 15 MG 24 hr capsule Take 15 mg by mouth daily as needed.    Marland Kitchen HYDROcodone-acetaminophen (NORCO) 5-325 MG tablet Take 2 tablets by mouth every 4 (four) hours as needed. 30 tablet 0  . levothyroxine  (SYNTHROID, LEVOTHROID) 50 MCG tablet TAKE 1 TABLET BY MOUTH ONCE DAILY BEFORE BREAKFAST 90 tablet 2  . losartan (COZAAR) 50 MG tablet Take 50 mg by mouth daily. Patient takes 1/2 tablet daily.    . metFORMIN (GLUCOPHAGE-XR) 500 MG 24 hr tablet TAKE 2 TABLETS TWICE A DAY FOR DIABETES 360 tablet 1  . naproxen (NAPROSYN) 500 MG tablet Take 500 mg by mouth 2 (two) times daily with a meal. Takes bid    . ONE TOUCH ULTRA TEST test strip TEST BLOOD SUGAR 3 TIMES A DAY 100 each 3  . ONETOUCH DELICA LANCETS 62V MISC USE TO CHECK SUGAR DAILY 100 each 0  . testosterone cypionate (DEPOTESTOTERONE CYPIONATE) 200 MG/ML injection USE 2ML INTRAMUSCULARLY EVERY 2 WEEKS AS DIRECTED 10 mL 1  . vitamin C (ASCORBIC ACID) 500 MG tablet Take 500 mg by mouth 3 (three) times daily as needed (immune support).     No current facility-administered medications on file prior to visit.    Medical History:  Past Medical History:  Diagnosis Date  . DDD (degenerative disc disease)   . Diabetes mellitus without complication ([place])   . GERD (gastroesophageal reflux disease)   . Hyperlipidemia   . Hypertension   . Hypogonadism male   . Vitamin D deficiency    Allergies: No Known Allergies   Review of Systems:  Review of Systems  Constitutional: Negative.   HENT: Positive for tinnitus. Negative for congestion, ear discharge, ear pain, hearing loss, nosebleeds, sinus pain and sore throat.   Eyes: Negative.   Respiratory: Negative.  Negative for stridor.   Cardiovascular: Negative.   Gastrointestinal: Negative.   Genitourinary: Negative.   Musculoskeletal: Positive for back pain and joint pain. Negative for falls, myalgias and neck pain.  Skin: Negative.   Neurological: Negative.   Endo/Heme/Allergies: Negative.   Psychiatric/Behavioral: Negative.     Family history- Review and unchanged Social history- Review and unchanged Physical Exam: BP 128/76   Pulse 79   Temp 97.5 F (36.4 C)   Resp 16   Ht 5\' 10"   (1.778 m)   Wt 194 lb (88 kg)   SpO2 98%   BMI 27.84 kg/m  Wt Readings from Last 3 Encounters:  08/29/16 194 lb (88 kg)  05/23/16 193 lb 6.4 oz (87.7 kg)  02/17/16 185 lb (83.9 kg)   General Appearance: Well nourished, in no apparent distress. Eyes: PERRLA, EOMs, conjunctiva no swelling or erythema Sinuses: No Frontal/maxillary tenderness ENT/Mouth: Ext aud canals clear, TMs without erythema, bulging, +effusion. No erythema, swelling, or exudate on post pharynx.  Tonsils not swollen or erythematous. Hearing normal. + TMJ Neck: Supple, thyroid norma, no bruit.  Respiratory: Respiratory effort normal, BS equal bilaterally without rales, rhonchi, wheezing or stridor.  Cardio: RRR with no MRGs. Brisk peripheral pulses without edema.  Abdomen: Soft, + BS.  Non tender, no guarding, rebound, hernias, masses. Lymphatics:  Non tender without lymphadenopathy.  Musculoskeletal: Full ROM, 5/5 strength, Normal  gait Skin: Warm, dry without rashes, lesions, ecchymosis.  Neuro: Cranial nerves intact. No cerebellar symptoms.  Psych: Awake and oriented X 3, normal affect, Insight and Judgment appropriate.    Vicie Mutters, PA-C 3:46 PM [facility]

## 2016-08-29 ENCOUNTER — Encounter: Payer: Self-pay | Admitting: Physician Assistant

## 2016-08-29 ENCOUNTER — Ambulatory Visit (INDEPENDENT_AMBULATORY_CARE_PROVIDER_SITE_OTHER): Payer: 59 | Admitting: Physician Assistant

## 2016-08-29 VITALS — BP 128/76 | HR 79 | Temp 97.5°F | Resp 16 | Ht 70.0 in | Wt 194.0 lb

## 2016-08-29 DIAGNOSIS — I10 Essential (primary) hypertension: Secondary | ICD-10-CM | POA: Diagnosis not present

## 2016-08-29 DIAGNOSIS — E1129 Type 2 diabetes mellitus with other diabetic kidney complication: Secondary | ICD-10-CM | POA: Diagnosis not present

## 2016-08-29 DIAGNOSIS — E559 Vitamin D deficiency, unspecified: Secondary | ICD-10-CM | POA: Diagnosis not present

## 2016-08-29 DIAGNOSIS — Z79899 Other long term (current) drug therapy: Secondary | ICD-10-CM | POA: Diagnosis not present

## 2016-08-29 DIAGNOSIS — J449 Chronic obstructive pulmonary disease, unspecified: Secondary | ICD-10-CM

## 2016-08-29 DIAGNOSIS — E782 Mixed hyperlipidemia: Secondary | ICD-10-CM | POA: Diagnosis not present

## 2016-08-29 DIAGNOSIS — E349 Endocrine disorder, unspecified: Secondary | ICD-10-CM

## 2016-08-29 LAB — TSH: TSH: 2.58 mIU/L (ref 0.40–4.50)

## 2016-08-29 LAB — CBC WITH DIFFERENTIAL/PLATELET
BASOS ABS: 0 {cells}/uL (ref 0–200)
Basophils Relative: 0 %
EOS PCT: 1 %
Eosinophils Absolute: 123 cells/uL (ref 15–500)
HEMATOCRIT: 47.8 % (ref 38.5–50.0)
HEMOGLOBIN: 15.9 g/dL (ref 13.2–17.1)
LYMPHS ABS: 3813 {cells}/uL (ref 850–3900)
LYMPHS PCT: 31 %
MCH: 30 pg (ref 27.0–33.0)
MCHC: 33.3 g/dL (ref 32.0–36.0)
MCV: 90.2 fL (ref 80.0–100.0)
MPV: 10.3 fL (ref 7.5–12.5)
Monocytes Absolute: 738 cells/uL (ref 200–950)
Monocytes Relative: 6 %
NEUTROS PCT: 62 %
Neutro Abs: 7626 cells/uL (ref 1500–7800)
Platelets: 253 10*3/uL (ref 140–400)
RBC: 5.3 MIL/uL (ref 4.20–5.80)
RDW: 14.1 % (ref 11.0–15.0)
WBC: 12.3 10*3/uL — AB (ref 3.8–10.8)

## 2016-08-29 MED ORDER — PREDNISONE 20 MG PO TABS: ORAL_TABLET | ORAL | 0 refills | Status: DC

## 2016-08-29 NOTE — Patient Instructions (Addendum)
Your ears and sinuses are connected by the eustachian tube. When your sinuses are inflamed, this can close off the tube and cause fluid to collect in your middle ear. This can then cause dizziness, popping, clicking, ringing, and echoing in your ears. This is often NOT an infection and does NOT require antibiotics, it is caused by inflammation so the treatments help the inflammation. This can take a long time to get better so please be patient.  Here are things you can do to help with this: - Try the Flonase or Nasonex. Remember to spray each nostril twice towards the outer part of your eye.  Do not sniff but instead pinch your nose and tilt your head back to help the medicine get into your sinuses.  The best time to do this is at bedtime.Stop if you get blurred vision or nose bleeds.  -While drinking fluids, pinch and hold nose close and swallow, to help open eustachian tubes to drain fluid behind ear drums. -Please pick one of the over the counter allergy medications below and take it once daily for allergies.  It will also help with fluid behind ear drums. Claritin or loratadine cheapest but likely the weakest  Zyrtec or certizine at night because it can make you sleepy The strongest is allegra or fexafinadine  Cheapest at [facility], [employer], [employer] -can use decongestant over the counter, please do not use if you have high blood pressure or certain heart conditions.   if worsening HA, changes vision/speech, imbalance, weakness go to the ER  What is the TMJ? The temporomandibular (tem-PUH-ro-man-DIB-yoo-ler) joint, or the TMJ, connects the upper and lower jawbones. This joint allows the jaw to open wide and move back and forth when you chew, talk, or yawn.There are also several muscles that help this joint move. There can be muscle tightness and pain in the muscle that can cause several symptoms.  What causes TMJ pain? There are many causes of TMJ pain. Repeated chewing (for example, chewing gum) and  clenching your teeth can cause pain in the joint. Some TMJ pain has no obvious cause. What can I do to ease the pain? There are many things you can do to help your pain get better. When you have pain:  Eat soft foods and stay away from chewy foods (for example, taffy) Try to use both sides of your mouth to chew Don't chew gum Massage Don't open your mouth wide (for example, during yawning or singing) Don't bite your cheeks or fingernails Lower your amount of stress and worry Applying a warm, damp washcloth to the joint may help. Over-the-counter pain medicines such as ibuprofen (one brand: Advil) or acetaminophen (one brand: Tylenol) might also help. Do not use these medicines if you are allergic to them or if your doctor told you not to use them. How can I stop the pain from coming back? When your pain is better, you can do these exercises to make your muscles stronger and to keep the pain from coming back:  Resisted mouth opening: Place your thumb or two fingers under your chin and open your mouth slowly, pushing up lightly on your chin with your thumb. Hold for three to six seconds. Close your mouth slowly. Resisted mouth closing: Place your thumbs under your chin and your two index fingers on the ridge between your mouth and the bottom of your chin. Push down lightly on your chin as you close your mouth. Tongue up: Slowly open and close your mouth while keeping  the tongue touching the roof of the mouth. Side-to-side jaw movement: Place an object about one fourth of an inch thick (for example, two tongue depressors) between your front teeth. Slowly move your jaw from side to side. Increase the thickness of the object as the exercise becomes easier Forward jaw movement: Place an object about one fourth of an inch thick between your front teeth and move the bottom jaw forward so that the bottom teeth are in front of the top teeth. Increase the thickness of the object as the exercise becomes  easier. These exercises should not be painful. If it hurts to do these exercises, stop doing them and talk to your family doctor.     Take omeprazole over the counter for 2 weeks, then go to zantac 150-300 mg at night for 2 weeks, then you can stop.  Avoid alcohol, spicy foods, NSAIDS (aleve, ibuprofen) at this time. See foods below.   Food Choices for Gastroesophageal Reflux Disease When you have gastroesophageal reflux disease (GERD), the foods you eat and your eating habits are very important. Choosing the right foods can help ease the discomfort of GERD. WHAT GENERAL GUIDELINES DO I NEED TO FOLLOW?  Choose fruits, vegetables, whole grains, low-fat dairy products, and low-fat meat, fish, and poultry.  Limit fats such as oils, salad dressings, butter, nuts, and avocado.  Keep a food diary to identify foods that cause symptoms.  Avoid foods that cause reflux. These may be different for different people.  Eat frequent small meals instead of three large meals each day.  Eat your meals slowly, in a relaxed setting.  Limit fried foods.  Cook foods using methods other than frying.  Avoid drinking alcohol.  Avoid drinking large amounts of liquids with your meals.  Avoid bending over or lying down until 2-3 hours after eating. WHAT FOODS ARE NOT RECOMMENDED? The following are some foods and drinks that may worsen your symptoms: Vegetables Tomatoes. Tomato juice. Tomato and spaghetti sauce. Chili peppers. Onion and garlic. Horseradish. Fruits Oranges, grapefruit, and lemon (fruit and juice). Meats High-fat meats, fish, and poultry. This includes hot dogs, ribs, ham, sausage, salami, and bacon. Dairy Whole milk and chocolate milk. Sour cream. Cream. Butter. Ice cream. Cream cheese.  Beverages Coffee and tea, with or without caffeine. Carbonated beverages or energy drinks. Condiments Hot sauce. Barbecue sauce.  Sweets/Desserts Chocolate and cocoa. Donuts. Peppermint and  spearmint. Fats and Oils High-fat foods, including [place] fries and potato chips. Other Vinegar. Strong spices, such as black pepper, white pepper, red pepper, cayenne, curry powder, cloves, ginger, and chili powder.   Cerebral Aneurysm An aneurysm is the bulging or ballooning out of part of the weakened wall of a vein or artery. An aneurysm in the vein or artery of the brain is called a brain aneurysm, or cerebral aneurysm. Aneurysms are a risk to your health because they may leak or rupture. Once the aneurysm leaks or ruptures, bleeding occurs. If the bleeding occurs within the brain tissue, the condition is called an intracerebral hemorrhage. An intracerebral hemorrhage can result in a hemorrhagic stroke. If the bleeding occurs in the area between the brain and the thin tissues that cover the brain, the condition is called a subarachnoid hemorrhage. This increases the pressure on the brain and causes some areas of the brain to not get the necessary blood flow. The blood from the ruptured aneurysm collects and presses on the surrounding brain tissue. A subarachnoid hemorrhage can cause a stroke. A ruptured cerebral aneurysm  is a medical emergency. This can cause permanent damage and loss of brain function. What are the causes? A cerebral aneurysm is caused when a weakened part of the blood vessel expands. The blood vessel expands due to the constant pressure from the flow of blood through the weakened blood vessel. Usually the aneurysm expands slowly. As the weakened aneurysm expands, the walls of the aneurysm become weaker. Aneurysms may be associated with diseases that weaken and damage the walls of your blood vessels or blood vessels that develop abnormally. Some known causes for cerebral aneurysms are:  Head trauma.  Infection.  Use of "recreational drugs" such as cocaine or amphetamines. What increases the risk? People at risk for a cerebral aneurysm or hemorrhagic stroke usually have one  or more risk factors, which include:  Having high blood pressure (hypertension).  Abusing alcohol.  Having abnormal blood vessels present since birth.  Having certain bleeding disorders, such as hemophilia, sickle cell disease, or liver disease.  Taking blood thinners (anticoagulants).  Smoking.  Having a family history of aneurysm. What are the signs or symptoms? The signs and symptoms of an unruptured cerebral aneurysm will partly depend on its size and rate of growth. A small, unchanging aneurysm generally does not produce symptoms. A larger aneurysm that is steadily growing can increase pressure on the brain or nerves. That increased pressure from the unruptured cerebral aneurysm can cause:  A headache.  Problems with your vision.  Numbness or weakness in an arm or leg.  Problems with memory.  Problems speaking.  Seizures. If an aneurysm leaks or bursts, it can cause a stroke and be life-threatening. Symptoms may include:  A sudden, severe headache with no known cause. The headache is often described as the worst headache ever experienced.  Nausea or vomiting, especially when combined with other symptoms such as a headache.  Sudden weakness or numbness of the face, arm, or leg, especially on one side of the body.  Sudden trouble walking or difficulty moving arms or legs.  Sudden confusion.  Sudden personality changes.  Trouble speaking (aphasia) or understanding.  Difficulty swallowing.  Sudden trouble seeing in one or both eyes.  Double vision.  Dizziness.  Loss of balance or coordination.  Intolerance to light.  Stiff neck. How is this diagnosed? Your health care provider may use one of the following tests to diagnose your aneurysm:  Computed tomographic angiography (CTA). This test uses dye and a scanner to produce images of your blood vessels.  Magnetic resonance angiography (MRA). This test uses an MRI machine to produce images of your blood  vessels.  Digital subtraction angiography (DSA). This test uses dye and X-rays to take images of your blood vessels. Your health care provider may use this test to help determine the best course of treatment. How is this treated? Unruptured Aneurysms  Treatment is complex when an aneurysm is found and it is not causing problems. Treatment is very individualized, as each case is different. Many things must be considered, such as the size and exact location of your aneurysm, your age, your overall health, and your feelings and preferences. Small aneurysms in certain locations of the brain have a very low chance of bleeding or rupturing. These small aneurysms may not be treated. However, depending on the size and location of the aneurysm, treatments may be recommended and include:  Coiling. During this procedure, a catheter is inserted and advanced through a blood vessel. Once the catheter reaches the aneurysm, tiny coils are used to  block blood flow into the aneurysm.  Surgical clipping. During surgery, a clip is placed at the base of the aneurysm. The clip prevents blood from continuing to enter the aneurysm.  Flow diversion. This procedure is used to divert blood flow around the aneurysm. Ruptured Aneurysms  Immediate emergency surgery may be needed to help prevent damage to the brain and to reduce the risk of rebleeding. Timing of treatment is an important factor in the prevention of complications. Successful early treatment of a ruptured aneurysm (within the first 3 days of a bleed) helps to prevent rebleeding and blood vessel spasm. In some cases, there may be a reason to treat later (10-14 days after a rupture). Many things are considered when making this decision, and each case is handled individually. Follow these instructions at home:  Take medicines only as instructed by your health care provider.  Eat healthy foods. It is recommended that you eat 5 or more servings of fruits and vegetables  each day. Foods may need to be a special consistency (soft or pureed), or small bites may need to be taken if you have had a ruptured aneurysm or stroke. Certain dietary changes may be advised to address high blood pressure, high cholesterol, diabetes, or obesity.  Food choices that are low in salt (sodium), saturated fat, trans fat, and cholesterol are recommended to manage high blood pressure.  Food choices that are high in fiber and low in saturated fat, trans fat, and cholesterol are recommended to control cholesterol levels.  Controlling carbohydrate and sugar intake is recommended to manage diabetes.  Reducing calorie intake and making food choices that are low in sodium, saturated fat, trans fat, and cholesterol are recommended to manage obesity.  Maintain a healthy weight.  Stay physically active. It is recommended that you get at least 30 minutes of activity on most or all days.  Do not smoke.  Limit alcohol use. Moderate alcohol use is considered to be:  No more than 2 drinks each day for men.  No more than 1 drink each day for nonpregnant women.  Stop drug abuse.  A safe home environment is important to reduce the risk of falls. Your health care provider may arrange for specialists to evaluate your home. Having grab bars in the bedroom and bathroom is often important. Your health care provider may arrange for special equipment to be used at home, such as raised toilets and a seat for the shower.  Physical, occupational, and speech therapy. Ongoing therapy may be needed to maximize your recovery after a ruptured aneurysm or stroke. If you have been advised to use a walker or a cane, use it at all times. Be sure to keep your therapy appointments.  Follow all instructions for follow-up with your health care provider. This is very important. This includes any referrals, physical therapy, rehabilitation, and laboratory tests. Proper follow-up may prevent an aneurysm rupture or a  stroke. Get help right away if:  You have a sudden, severe headache with no known cause.  You have sudden nausea or vomiting with a severe headache.  You have sudden weakness or numbness of the face, arm, or leg, especially on one side of the body.  You have sudden trouble walking or difficulty moving arms or legs.  You have sudden confusion.  You have trouble speaking or understanding.  You have sudden trouble seeing in one or both eyes.  You have a sudden loss of balance or coordination.  You have a stiff neck.  You have difficulty breathing.  You have a partial or total loss of consciousness. Any of these symptoms may represent a serious problem that is an emergency. Do not wait to see if the symptoms will go away. Get medical help at once. Call your local emergency services (911 in [place] Do not drive yourself to the hospital. This information is not intended to replace advice given to you by your health care provider. Make sure you discuss any questions you have with your health care provider. Document Released: 12/31/2001 Document Revised: 09/16/2015 Document Reviewed: 09/26/2012 [employer] Interactive Patient Education  2017 [employer].

## 2016-08-30 LAB — BASIC METABOLIC PANEL WITH GFR
BUN: 20 mg/dL (ref 7–25)
CALCIUM: 9.6 mg/dL (ref 8.6–10.3)
CO2: 19 mmol/L — AB (ref 20–31)
Chloride: 106 mmol/L (ref 98–110)
Creat: 1.13 mg/dL (ref 0.70–1.33)
GFR, EST NON AFRICAN AMERICAN: 74 mL/min (ref >=60)
GFR, Est African American: 85 mL/min (ref >=60)
GLUCOSE: 82 mg/dL (ref 65–99)
POTASSIUM: 4.8 mmol/L (ref 3.5–5.3)
Sodium: 139 mmol/L (ref 135–146)

## 2016-08-30 LAB — HEPATIC FUNCTION PANEL
ALK PHOS: 67 U/L (ref 40–115)
ALT: 24 U/L (ref 9–46)
AST: 23 U/L (ref 10–35)
Albumin: 4.7 g/dL (ref 3.6–5.1)
BILIRUBIN INDIRECT: 0.2 mg/dL (ref 0.2–1.2)
Bilirubin, Direct: 0.1 mg/dL (ref <=0.2)
TOTAL PROTEIN: 7.1 g/dL (ref 6.1–8.1)
Total Bilirubin: 0.3 mg/dL (ref 0.2–1.2)

## 2016-08-30 LAB — LIPID PANEL
Cholesterol: 133 mg/dL (ref <200)
HDL: 29 mg/dL — ABNORMAL LOW (ref >40)
LDL CALC: 56 mg/dL (ref <100)
Total CHOL/HDL Ratio: 4.6 Ratio (ref <5.0)
Triglycerides: 241 mg/dL — ABNORMAL HIGH (ref <150)
VLDL: 48 mg/dL — AB (ref <30)

## 2016-08-30 LAB — MAGNESIUM: MAGNESIUM: 2.1 mg/dL (ref 1.5–2.5)

## 2016-08-30 LAB — HEMOGLOBIN A1C
Hgb A1c MFr Bld: 5.6 % (ref <5.7)
MEAN PLASMA GLUCOSE: 114 mg/dL

## 2016-08-30 LAB — VITAMIN D 25 HYDROXY (VIT D DEFICIENCY, FRACTURES): Vit D, 25-Hydroxy: 79 ng/mL (ref 30–100)

## 2016-09-07 ENCOUNTER — Other Ambulatory Visit: Payer: Self-pay | Admitting: Internal Medicine

## 2016-11-14 DIAGNOSIS — E039 Hypothyroidism, unspecified: Secondary | ICD-10-CM | POA: Diagnosis not present

## 2016-11-14 DIAGNOSIS — E291 Testicular hypofunction: Secondary | ICD-10-CM | POA: Diagnosis not present

## 2016-11-14 DIAGNOSIS — M549 Dorsalgia, unspecified: Secondary | ICD-10-CM | POA: Diagnosis not present

## 2016-12-27 ENCOUNTER — Encounter: Payer: Self-pay | Admitting: Internal Medicine

## 2017-02-14 ENCOUNTER — Encounter: Payer: Self-pay | Admitting: Internal Medicine

## 2017-02-21 ENCOUNTER — Encounter: Payer: Self-pay | Admitting: Internal Medicine

## 2017-02-21 ENCOUNTER — Ambulatory Visit (INDEPENDENT_AMBULATORY_CARE_PROVIDER_SITE_OTHER): Payer: 59 | Admitting: Internal Medicine

## 2017-02-21 VITALS — BP 138/84 | HR 76 | Temp 97.7°F | Resp 18 | Ht 70.0 in | Wt 189.2 lb

## 2017-02-21 DIAGNOSIS — E782 Mixed hyperlipidemia: Secondary | ICD-10-CM

## 2017-02-21 DIAGNOSIS — Z136 Encounter for screening for cardiovascular disorders: Secondary | ICD-10-CM | POA: Diagnosis not present

## 2017-02-21 DIAGNOSIS — E039 Hypothyroidism, unspecified: Secondary | ICD-10-CM

## 2017-02-21 DIAGNOSIS — E1129 Type 2 diabetes mellitus with other diabetic kidney complication: Secondary | ICD-10-CM

## 2017-02-21 DIAGNOSIS — R5383 Other fatigue: Secondary | ICD-10-CM

## 2017-02-21 DIAGNOSIS — I10 Essential (primary) hypertension: Secondary | ICD-10-CM | POA: Diagnosis not present

## 2017-02-21 DIAGNOSIS — E349 Endocrine disorder, unspecified: Secondary | ICD-10-CM

## 2017-02-21 DIAGNOSIS — Z125 Encounter for screening for malignant neoplasm of prostate: Secondary | ICD-10-CM

## 2017-02-21 DIAGNOSIS — Z0001 Encounter for general adult medical examination with abnormal findings: Secondary | ICD-10-CM | POA: Diagnosis not present

## 2017-02-21 DIAGNOSIS — Z111 Encounter for screening for respiratory tuberculosis: Secondary | ICD-10-CM

## 2017-02-21 DIAGNOSIS — Z23 Encounter for immunization: Secondary | ICD-10-CM | POA: Diagnosis not present

## 2017-02-21 DIAGNOSIS — Z1211 Encounter for screening for malignant neoplasm of colon: Secondary | ICD-10-CM

## 2017-02-21 DIAGNOSIS — Z1212 Encounter for screening for malignant neoplasm of rectum: Secondary | ICD-10-CM

## 2017-02-21 DIAGNOSIS — F17200 Nicotine dependence, unspecified, uncomplicated: Secondary | ICD-10-CM

## 2017-02-21 DIAGNOSIS — E559 Vitamin D deficiency, unspecified: Secondary | ICD-10-CM

## 2017-02-21 DIAGNOSIS — Z79899 Other long term (current) drug therapy: Secondary | ICD-10-CM

## 2017-02-21 NOTE — Progress Notes (Signed)
[facility]   Paul Gates Paul Gates, M.D.     Paul Gates Paul Gates. Paul Gates Paul Gates, P.A.-C Paul Gates Paul Gates, Paul Gates                [address] [place], [place] [address] Telephone [phone] Telefax [phone] Annual  Screening/Preventative Visit  & Comprehensive Evaluation & Examination     This very nice 56 y.o. single WM presents for a Screening/Preventative Visit & comprehensive evaluation and management of multiple medical co-morbidities.  Patient has been followed for HTN, Prediabetes, Hyperlipidemia and Vitamin D Deficiency. Other problems include Cx DDD s/p surgery.     HTN predates since 2014. Patient's BP has been controlled at home.  Today's BP is at goal - 138/84. Patient denies any cardiac symptoms as chest pain, palpitations, shortness of breath, dizziness or ankle swelling.     Patient's hyperlipidemia is controlled with diet and medications. Patient denies myalgias or other medication SE's. 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  Patient denies reactive hypoglycemic symptoms, visual blurring, diabetic polys or paresthesias. Last A1c was at goal: Lab Results  Component Value Date   HGBA1C 5.6 08/29/2016       Patient has been on Thyroid Replacement since 2015. Finally, patient has history of Vitamin D Deficiency ("33" in 2013)  and last vitamin D was at goal: Lab Results  Component Value Date   VD25OH 79 08/29/2016   Current Outpatient Prescriptions on File Prior to Visit  Medication Sig  . B-D 3CC LUER-LOK SYR 21GX1" 21G X 1" 3 ML MISC USE AS DIRECTED  . cetirizine (ZYRTEC) 10 MG tablet Take 10 mg by mouth daily.   . Cholecalciferol (VITAMIN D-3) 5000 UNITS TABS Take 10,000 Units by mouth daily.  . cyclobenzaprine (FLEXERIL) 10 MG tablet Take 10 mg by mouth daily as needed for muscle spasms. Takes prn  . dextroamphetamine (DEXEDRINE SPANSULE) 15 MG 24 hr capsule Take 15 mg by mouth daily as needed.  Marland Kitchen levothyroxine (SYNTHROID, LEVOTHROID) 50 MCG tablet TAKE 1 TABLET BY MOUTH ONCE DAILY BEFORE BREAKFAST  . losartan (COZAAR) 50 MG tablet Take 50 mg by mouth daily. Patient takes 1/2 tablet daily.  . metFORMIN (GLUCOPHAGE-XR) 500 MG 24 hr tablet TAKE 2 TABLETS TWICE A DAY FOR DIABETES  . naproxen (NAPROSYN) 500 MG tablet Take 500 mg by mouth 2 (two) times daily with a meal. Takes bid  . ONE TOUCH ULTRA TEST test strip TEST BLOOD SUGAR 3 TIMES A DAY  . ONETOUCH DELICA LANCETS 65Y MISC USE TO CHECK SUGAR DAILY  . testosterone cypionate (DEPOTESTOTERONE CYPIONATE) 200 MG/ML injection USE 2ML INTRAMUSCULARLY EVERY 2 WEEKS AS DIRECTED  . vitamin C (ASCORBIC ACID) 500 MG tablet Take 500 mg by mouth 3 (three) times daily as needed (immune support).  Paul Gates CONTINUING MONTH PAK 1 MG tablet TAKE AS DIRECTED (Patient not taking: Reported on 02/21/2017)   No current facility-administered medications on file prior to visit.    No Known Allergies Past Medical History:  Diagnosis Date  . DDD (degenerative disc disease)   .  Diabetes mellitus without complication ([place])   . GERD (gastroesophageal reflux disease)   . Hyperlipidemia   . Hypertension   . Hypogonadism male   . Vitamin D deficiency    Health Maintenance  Topic Date Due  . OPHTHALMOLOGY EXAM  01/10/2017  . HEMOGLOBIN A1C  03/01/2017  . FOOT EXAM  02/21/2018  . PNEUMOCOCCAL POLYSACCHARIDE VACCINE (2) 02/21/2022  . TETANUS/TDAP  07/18/2023  . COLONOSCOPY  08/28/2023  . INFLUENZA VACCINE  Completed  . Hepatitis C Screening  Completed  . HIV Screening  Completed   Immunization History  Administered Date(s) Administered  . DTaP 04/04/2001,  04/24/2008  . Influenza Inj Mdck Quad With Preservative 02/21/2017  . Influenza Whole 01/09/2013  . Influenza,inj,quad, With Preservative 02/17/2016  . PPD Test 10/09/2013, 10/13/2014, 11/17/2015, 02/21/2017  . Pneumococcal Polysaccharide-23 02/21/2017  . Tdap 07/17/2013   Past Surgical History:  Procedure Laterality Date  . 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 CERVICAL FUSION  1991  . ESOPHAGOGASTRODUODENOSCOPY  1987,1992,1993,1995   duodenal ulcer  . LUMBAR DISC SURGERY    . LUMBAR FUSION  2001  . LUMBAR LAMINECTOMY  7/97   Family History  Problem Relation Age of Onset  . Hypertension Mother   . Stroke Mother   . Cancer Mother        ovarian  . Alcohol abuse Father   . Hypertension Father   . Diabetes Maternal Uncle   . Cancer Maternal Grandmother        lung  . Cancer Paternal Grandfather        lung  . Colon cancer Neg Hx   . Stomach cancer Neg Hx     Social History  . Marital status: Single    Spouse name: N/A  . Number of children: N/A  . Years of education: N/A   Social History Main Topics  . Smoking status: Current Every Day Smoker    Packs/day: 0.75    Years: 30.00    Types: Cigarettes  . Smokeless tobacco: Never Used  . Alcohol use 2.4 oz/week    4 Standard drinks or equivalent per week  . Drug use: No  . Sexual activity: Not on file    ROS Constitutional: Denies fever, chills, weight loss/gain, headaches, insomnia,  night sweats or change in appetite. Does c/o fatigue. Eyes: Denies redness, blurred vision, diplopia, discharge, itchy or watery eyes.  ENT: Denies discharge, congestion, post nasal drip, epistaxis, sore throat, earache, hearing loss, dental pain, Tinnitus, Vertigo, Sinus pain or snoring.  Cardio: Denies chest pain, palpitations, irregular heartbeat, syncope, dyspnea, diaphoresis, orthopnea, PND, claudication or edema Respiratory: denies cough, dyspnea, DOE, pleurisy, hoarseness, laryngitis or wheezing.  Gastrointestinal: Denies dysphagia,  heartburn, reflux, water brash, pain, cramps, nausea, vomiting, bloating, diarrhea, constipation, hematemesis, melena, hematochezia, jaundice or hemorrhoids Genitourinary: Denies dysuria, frequency, urgency, nocturia, hesitancy, discharge, hematuria or flank pain Musculoskeletal: Denies arthralgia, myalgia, stiffness, Jt. Swelling, pain, limp or strain/sprain. Denies Falls. Skin: Denies puritis, rash, hives, warts, acne, eczema or change in skin lesion Neuro: No weakness, tremor, incoordination, spasms, paresthesia or pain Psychiatric: Denies confusion, memory loss or sensory loss. Denies Depression. Endocrine: Denies change in weight, skin, hair change, nocturia, and paresthesia, diabetic polys, visual blurring or hyper / hypo glycemic episodes.  Heme/Lymph: No excessive bleeding, bruising or enlarged lymph nodes.  Physical Exam  BP 138/84   Pulse 76   Temp 97.7 F (36.5 C)   Resp 18   Ht 5\' 10"  (1.778 m)   Wt  189 lb 3.2 oz (85.8 kg)   BMI 27.15 kg/m   General Appearance: Well nourished and well groomed and in no apparent distress.  Eyes: PERRLA, EOMs, conjunctiva no swelling or erythema, normal fundi and vessels. Sinuses: No frontal/maxillary tenderness ENT/Mouth: EACs patent / TMs  nl. Nares clear without erythema, swelling, mucoid exudates. Oral hygiene is good. No erythema, swelling, or exudate. Tongue normal, non-obstructing. Tonsils not swollen or erythematous. Hearing normal.  Neck: Supple, thyroid normal. No bruits, nodes or JVD. Respiratory: Respiratory effort normal.  BS equal and clear bilateral without rales, rhonci, wheezing or stridor. Cardio: Heart sounds are normal with regular rate and rhythm and no murmurs, rubs or gallops. Peripheral pulses are normal and equal bilaterally without edema. No aortic or femoral bruits. Chest: symmetric with normal excursions and percussion.  Abdomen: Soft, with Nl bowel sounds. Nontender, no guarding, rebound, hernias, masses, or  organomegaly.  Lymphatics: Non tender without lymphadenopathy.  Genitourinary: No hernias.Testes nl. DRE - prostate nl for age - smooth & firm w/o nodules. Musculoskeletal: Full ROM all peripheral extremities, joint stability, 5/5 strength, and normal gait. Skin: Warm and dry without rashes, lesions, cyanosis, clubbing or  ecchymosis.  Neuro: Cranial nerves intact, reflexes equal bilaterally. Normal muscle tone, no cerebellar symptoms. Sensation intact.  Pysch: Alert and oriented X 3 with normal affect, insight and judgment appropriate.   Assessment and Plan  1. Annual Preventative/Screening Exam   2. Essential hypertension  - EKG 12-Lead - [place], RETROPERITNL ABD,  LTD - Urinalysis, Routine w reflex microscopic - Microalbumin / creatinine urine ratio - CBC with Differential/Platelet - BASIC METABOLIC PANEL WITH GFR - Magnesium - TSH  3. Hyperlipidemia, mixed  - EKG 12-Lead - [place], RETROPERITNL ABD,  LTD - Hepatic function panel - Lipid panel - TSH  4. 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 Vitamin D deficiency   6. Hypothyroidism  - TSH  7. Testosterone deficiency  - Testosterone  8. Screening for colorectal cancer  - POC Hemoccult Bld/Stl  9. Prostate cancer screening  - PSA  10. Screening examination for pulmonary tuberculosis  - PPD  11. Screening for AAA (aortic abdominal aneurysm)  - [place], RETROPERITNL ABD,  LTD  12. Smoker  - [place], RETROPERITNL ABD,  LTD  13. Screening for ischemic heart disease  - EKG 12-Lead  14. Need for immunization against influenza  - FLU VACCINE MDCK QUAD W/Preservative  15. Need for prophylactic vaccination against Streptococcus pneumoniae (pneumococcus)  - Pneumococcal polysaccharide vaccine  23-valent greater than or equal to 2yo subcutaneous/IM  16. Fatigue, unspecified type  - Iron,Total/Total Iron Binding Cap - Vitamin B12 - Testosterone - CBC with Differential/Platelet  17. Medication management  - Urinalysis, Routine w reflex microscopic - Microalbumin / creatinine urine ratio - Testosterone - CBC with Differential/Platelet - BASIC METABOLIC PANEL WITH GFR - Hepatic function panel - Magnesium - Lipid panel - TSH - Hemoglobin A1c - Insulin, random      Patient was counseled in prudent diet, weight control to achieve/maintain BMI less than 25, BP monitoring, regular exercise and medications as discussed.  Discussed med effects and SE's. Routine screening labs and tests as requested with regular follow-up as recommended. Over 40 minutes of exam, counseling, chart review and high complex critical decision  making was performed

## 2017-02-21 NOTE — Patient Instructions (Signed)

## 2017-02-22 LAB — MAGNESIUM: Magnesium: 1.9 mg/dL (ref 1.5–2.5)

## 2017-02-22 LAB — CBC WITH DIFFERENTIAL/PLATELET
BASOS ABS: 26 {cells}/uL (ref 0–200)
Basophils Relative: 0.3 %
EOS ABS: 129 {cells}/uL (ref 15–500)
Eosinophils Relative: 1.5 %
HEMATOCRIT: 42.2 % (ref 38.5–50.0)
HEMOGLOBIN: 14.4 g/dL (ref 13.2–17.1)
LYMPHS ABS: 3414 {cells}/uL (ref 850–3900)
MCH: 30.3 pg (ref 27.0–33.0)
MCHC: 34.1 g/dL (ref 32.0–36.0)
MCV: 88.7 fL (ref 80.0–100.0)
MPV: 11 fL (ref 7.5–12.5)
Monocytes Relative: 7.2 %
NEUTROS ABS: 4412 {cells}/uL (ref 1500–7800)
NEUTROS PCT: 51.3 %
Platelets: 220 10*3/uL (ref 140–400)
RBC: 4.76 10*6/uL (ref 4.20–5.80)
RDW: 13.4 % (ref 11.0–15.0)
Total Lymphocyte: 39.7 %
WBC: 8.6 10*3/uL (ref 3.8–10.8)
WBCMIX: 619 {cells}/uL (ref 200–950)

## 2017-02-22 LAB — LIPID PANEL
CHOL/HDL RATIO: 5.3 (calc) — AB (ref <5.0)
Cholesterol: 133 mg/dL (ref <200)
HDL: 25 mg/dL — AB (ref >40)
LDL Cholesterol (Calc): 63 mg/dL (calc)
NON-HDL CHOLESTEROL (CALC): 108 mg/dL (ref <130)
TRIGLYCERIDES: 374 mg/dL — AB (ref <150)

## 2017-02-22 LAB — BASIC METABOLIC PANEL WITH GFR
BUN: 22 mg/dL (ref 7–25)
CO2: 26 mmol/L (ref 20–32)
CREATININE: 1.12 mg/dL (ref 0.70–1.33)
Calcium: 9.7 mg/dL (ref 8.6–10.3)
Chloride: 106 mmol/L (ref 98–110)
GFR, EST AFRICAN AMERICAN: 86 mL/min/{1.73_m2} (ref >=60)
GFR, EST NON AFRICAN AMERICAN: 74 mL/min/{1.73_m2} (ref >=60)
Glucose, Bld: 124 mg/dL — ABNORMAL HIGH (ref 65–99)
POTASSIUM: 4.4 mmol/L (ref 3.5–5.3)
SODIUM: 142 mmol/L (ref 135–146)

## 2017-02-22 LAB — IRON, TOTAL/TOTAL IRON BINDING CAP
%SAT: 18 % (ref 15–60)
Iron: 61 ug/dL (ref 50–180)
TIBC: 339 mcg/dL (calc) (ref 250–425)

## 2017-02-22 LAB — URINALYSIS, ROUTINE W REFLEX MICROSCOPIC
BILIRUBIN URINE: NEGATIVE
GLUCOSE, UA: NEGATIVE
Hgb urine dipstick: NEGATIVE
KETONES UR: NEGATIVE
Leukocytes, UA: NEGATIVE
Nitrite: NEGATIVE
PH: 5.5 (ref 5.0–8.0)
Protein, ur: NEGATIVE
SPECIFIC GRAVITY, URINE: 1.025 (ref 1.001–1.03)

## 2017-02-22 LAB — TSH: TSH: 1.37 m[IU]/L (ref 0.40–4.50)

## 2017-02-22 LAB — MICROALBUMIN / CREATININE URINE RATIO
CREATININE, URINE: 138 mg/dL (ref 20–320)
Microalb Creat Ratio: 7 mcg/mg creat (ref <30)
Microalb, Ur: 1 mg/dL

## 2017-02-22 LAB — HEMOGLOBIN A1C
EAG (MMOL/L): 7 (calc)
HEMOGLOBIN A1C: 6 %{Hb} — AB (ref <5.7)
MEAN PLASMA GLUCOSE: 126 (calc)

## 2017-02-22 LAB — HEPATIC FUNCTION PANEL
AG RATIO: 2.1 (calc) (ref 1.0–2.5)
ALKALINE PHOSPHATASE (APISO): 61 U/L (ref 40–115)
ALT: 30 U/L (ref 9–46)
AST: 18 U/L (ref 10–35)
Albumin: 4.7 g/dL (ref 3.6–5.1)
BILIRUBIN DIRECT: 0 mg/dL (ref 0.0–0.2)
BILIRUBIN TOTAL: 0.3 mg/dL (ref 0.2–1.2)
Globulin: 2.2 g/dL (calc) (ref 1.9–3.7)
Indirect Bilirubin: 0.3 mg/dL (calc) (ref 0.2–1.2)
Total Protein: 6.9 g/dL (ref 6.1–8.1)

## 2017-02-22 LAB — INSULIN, RANDOM: Insulin: 48.4 u[IU]/mL — ABNORMAL HIGH (ref 2.0–19.6)

## 2017-02-22 LAB — VITAMIN B12: VITAMIN B 12: 1349 pg/mL — AB (ref 200–1100)

## 2017-02-22 LAB — TESTOSTERONE: TESTOSTERONE: 316 ng/dL (ref 250–827)

## 2017-02-22 LAB — PSA: PSA: 1 ng/mL (ref <=4.0)

## 2017-03-21 ENCOUNTER — Other Ambulatory Visit: Payer: Self-pay

## 2017-03-21 DIAGNOSIS — Z1212 Encounter for screening for malignant neoplasm of rectum: Principal | ICD-10-CM

## 2017-03-21 DIAGNOSIS — Z1211 Encounter for screening for malignant neoplasm of colon: Secondary | ICD-10-CM | POA: Diagnosis not present

## 2017-03-21 LAB — POC HEMOCCULT BLD/STL (HOME/3-CARD/SCREEN): FECAL OCCULT BLD: NEGATIVE

## 2017-05-09 DIAGNOSIS — M47816 Spondylosis without myelopathy or radiculopathy, lumbar region: Secondary | ICD-10-CM | POA: Diagnosis not present

## 2017-05-24 DIAGNOSIS — M5416 Radiculopathy, lumbar region: Secondary | ICD-10-CM | POA: Diagnosis not present

## 2017-05-27 DIAGNOSIS — E039 Hypothyroidism, unspecified: Secondary | ICD-10-CM | POA: Insufficient documentation

## 2017-05-27 DIAGNOSIS — E663 Overweight: Secondary | ICD-10-CM | POA: Insufficient documentation

## 2017-05-27 NOTE — Progress Notes (Signed)
` FOLLOW UP  Assessment and Plan:   Hypertension Well controlled with current medications  Monitor blood pressure at home; patient to call if consistently greater than 130/80 Continue DASH diet.   Reminder to go to the ER if any CP, SOB, nausea, dizziness, severe HA, changes vision/speech, left arm numbness and tingling and jaw pain.   Cholesterol Currently at LDL goal; above goal on triglycerides - discussed, diet information provided Continue low cholesterol diet and exercise.  Check lipid panel.   Diabetes with diabetic chronic kidney disease Continue medication: metformin  Continue diet and exercise, progress with weight loss Perform daily foot/skin check, notify office of any concerning changes.  Check A1C  Obesity with co morbidities Long discussion about weight loss, diet, and exercise Recommended diet heavy in fruits and veggies and low in animal meats, cheeses, and dairy products, appropriate calorie intake Discussed ideal weight for height and initial weight goal (175 lb) Patient will work on continue to make small good choices, continue with current slow steady weight loss Will follow up in 3 months  Vitamin D Def At goal at last check over 6 months ago; continue supplementation to maintain goal of 70-100 Check biannual Vit D level  Hypogonadism - continue to monitor, states medication is helping with symptoms of low T.   Hypothyroidism continue medications the same: reminded to take on an empty stomach 30-54mins before food.  check TSH level  Tobacco use Discussed risks associated with tobacco use and advised to reduce or quit Patient is ready to do so and plans to Continue chantix Will follow up at the next visit  ADD Continue medications Helps with focus, no AE's; uses rarely The patient was counseled on the addictive nature of the medication and was encouraged to take drug holidays when not needed.    Continue diet and meds as discussed. Further  disposition pending results of labs. Discussed med's effects and SE's.   Over 30 minutes of exam, counseling, chart review, and critical decision making was performed.   Future Appointments  Date Time Provider [place]  08/27/2017  4:00 PM [name], MD [facility] None  04/02/2018  3:00 PM [name], MD [facility] None    ----------------------------------------------------------------------------------------------------------------------  HPI 57 y.o. male  presents for 3 month follow up on hypertension, cholesterol, T2 diabetes, weight, hypothyroid, tobacco use, hypogonadism on testosterone supplement, ADD and vitamin D deficiency.    he currently continues to smoke 4-5 pack a day (down from 0.75 pack daily); discussed risks associated with smoking, patient is ready to quit. He was prescribed chantix and has been taking; requests refill today.   Patient is on an ADD medication, he states that the medication is helping and he denies any adverse reactions. He takes 15 mg dextroamphetamine PRN; he takes very rarely - typically 1-2 occasions a month.   BMI is Body mass index is 26.98 kg/m., he has not been working on diet and exercise. He does plan to start biking again.  Wt Readings from Last 3 Encounters:  05/28/17 188 lb (85.3 kg)  02/21/17 189 lb 3.2 oz (85.8 kg)  08/29/16 194 lb (88 kg)   His blood pressure has been controlled at home, today their BP is BP: 134/74  He does workout. He denies chest pain, shortness of breath, dizziness.   He is on cholesterol medication and denies myalgias. His cholesterol is not at goal. The cholesterol last visit was:   Lab Results  Component Value Date   CHOL 133 02/21/2017  HDL 25 (L) 02/21/2017   LDLCALC 56 08/29/2016   TRIG 374 (H) 02/21/2017   CHOLHDL 5.3 (H) 02/21/2017    He has been working on diet and exercise for prediabetes, and denies increased appetite, nausea, paresthesia of the feet, polydipsia, polyuria,  visual disturbances and vomiting. Last A1C in the office was:  Lab Results  Component Value Date   HGBA1C 6.0 (H) 02/21/2017   He is on thyroid medication. His medication was not changed last visit.   Lab Results  Component Value Date   TSH 1.37 02/21/2017   Patient is on Vitamin D supplement and at goal at last check:    Lab Results  Component Value Date   VD25OH 79 08/29/2016     He has a history of testosterone deficiency and is on testosterone replacement. He states that the testosterone helps with his energy, libido, muscle mass. Lab Results  Component Value Date   TESTOSTERONE 316 02/21/2017      Current Medications:  Current Outpatient Medications on File Prior to Visit  Medication Sig  . B-D 3CC LUER-LOK SYR 21GX1" 21G X 1" 3 ML MISC USE AS DIRECTED  . CHANTIX CONTINUING MONTH PAK 1 MG tablet TAKE AS DIRECTED  . Cholecalciferol (VITAMIN D-3) 5000 UNITS TABS Take 10,000 Units by mouth daily.  . cyclobenzaprine (FLEXERIL) 10 MG tablet Take 10 mg by mouth daily as needed for muscle spasms. Takes prn  . dextroamphetamine (DEXEDRINE SPANSULE) 15 MG 24 hr capsule Take 15 mg by mouth daily as needed.  Marland Kitchen levothyroxine (SYNTHROID, LEVOTHROID) 50 MCG tablet TAKE 1 TABLET BY MOUTH ONCE DAILY BEFORE BREAKFAST  . losartan (COZAAR) 50 MG tablet Take 50 mg by mouth daily.   . metFORMIN (GLUCOPHAGE-XR) 500 MG 24 hr tablet TAKE 2 TABLETS TWICE A DAY FOR DIABETES  . ONE TOUCH ULTRA TEST test strip TEST BLOOD SUGAR 3 TIMES A DAY  . ONETOUCH DELICA LANCETS 16X MISC USE TO CHECK SUGAR DAILY  . testosterone cypionate (DEPOTESTOTERONE CYPIONATE) 200 MG/ML injection USE 2ML INTRAMUSCULARLY EVERY 2 WEEKS AS DIRECTED  . vitamin C (ASCORBIC ACID) 500 MG tablet Take 2,000 mg by mouth daily.   . Zinc 50 MG TABS Take by mouth.  . cetirizine (ZYRTEC) 10 MG tablet Take 10 mg by mouth daily.  . naproxen (NAPROSYN) 500 MG tablet Take 500 mg by mouth 2 (two) times daily with a meal. Takes bid   No  current facility-administered medications on file prior to visit.      Allergies: No Known Allergies   Medical History:  Past Medical History:  Diagnosis Date  . DDD (degenerative disc disease)   . Diabetes mellitus without complication ([place])   . GERD (gastroesophageal reflux disease)   . Hyperlipidemia   . Hypertension   . Hypogonadism male   . Vitamin D deficiency    Family history- Reviewed and unchanged Social history- Reviewed and unchanged   Review of Systems:  Review of Systems  Constitutional: Negative for malaise/fatigue and weight loss.  HENT: Negative for hearing loss and tinnitus.   Eyes: Negative for blurred vision and double vision.  Respiratory: Negative for cough, shortness of breath and wheezing.   Cardiovascular: Negative for chest pain, palpitations, orthopnea, claudication and leg swelling.  Gastrointestinal: Negative for abdominal pain, blood in stool, constipation, diarrhea, heartburn, melena, nausea and vomiting.  Genitourinary: Negative.   Musculoskeletal: Negative for joint pain and myalgias.  Skin: Negative for rash.  Neurological: Negative for dizziness, tingling, sensory change, weakness and headaches.  Endo/Heme/Allergies: Negative for polydipsia.  Psychiatric/Behavioral: Negative.   All other systems reviewed and are negative.     Physical Exam: BP 134/74   Pulse 76   Temp 97.7 F (36.5 C)   Ht 5\' 10"  (1.778 m)   Wt 188 lb (85.3 kg)   SpO2 95%   BMI 26.98 kg/m  Wt Readings from Last 3 Encounters:  05/28/17 188 lb (85.3 kg)  02/21/17 189 lb 3.2 oz (85.8 kg)  08/29/16 194 lb (88 kg)   General Appearance: Well nourished, in no apparent distress. Eyes: PERRLA, EOMs, conjunctiva no swelling or erythema Sinuses: No Frontal/maxillary tenderness ENT/Mouth: Ext aud canals clear, TMs without erythema, bulging. No erythema, swelling, or exudate on post pharynx.  Tonsils not swollen or erythematous. Hearing normal.  Neck: Supple, thyroid  normal.  Respiratory: Respiratory effort normal, BS equal bilaterally without rales, rhonchi, wheezing or stridor.  Cardio: RRR with no MRGs. Brisk peripheral pulses without edema.  Abdomen: Soft, + BS.  Non tender, no guarding, rebound, hernias, masses. Lymphatics: Non tender without lymphadenopathy.  Musculoskeletal: Full ROM, 5/5 strength, Normal gait Skin: Warm, dry without rashes, lesions, ecchymosis.  Neuro: Cranial nerves intact. No cerebellar symptoms.  Psych: Awake and oriented X 3, normal affect, Insight and Judgment appropriate.    Izora Ribas, NP 4:23 PM [facility]

## 2017-05-28 ENCOUNTER — Ambulatory Visit: Payer: 59 | Admitting: Adult Health

## 2017-05-28 ENCOUNTER — Encounter: Payer: Self-pay | Admitting: Adult Health

## 2017-05-28 VITALS — BP 134/74 | HR 76 | Temp 97.7°F | Ht 70.0 in | Wt 188.0 lb

## 2017-05-28 DIAGNOSIS — F988 Other specified behavioral and emotional disorders with onset usually occurring in childhood and adolescence: Secondary | ICD-10-CM | POA: Diagnosis not present

## 2017-05-28 DIAGNOSIS — E039 Hypothyroidism, unspecified: Secondary | ICD-10-CM

## 2017-05-28 DIAGNOSIS — E349 Endocrine disorder, unspecified: Secondary | ICD-10-CM | POA: Diagnosis not present

## 2017-05-28 DIAGNOSIS — E782 Mixed hyperlipidemia: Secondary | ICD-10-CM

## 2017-05-28 DIAGNOSIS — I10 Essential (primary) hypertension: Secondary | ICD-10-CM | POA: Diagnosis not present

## 2017-05-28 DIAGNOSIS — E663 Overweight: Secondary | ICD-10-CM

## 2017-05-28 DIAGNOSIS — E1129 Type 2 diabetes mellitus with other diabetic kidney complication: Secondary | ICD-10-CM

## 2017-05-28 DIAGNOSIS — Z79899 Other long term (current) drug therapy: Secondary | ICD-10-CM

## 2017-05-28 DIAGNOSIS — E559 Vitamin D deficiency, unspecified: Secondary | ICD-10-CM | POA: Diagnosis not present

## 2017-05-28 DIAGNOSIS — F17200 Nicotine dependence, unspecified, uncomplicated: Secondary | ICD-10-CM

## 2017-05-28 MED ORDER — VARENICLINE TARTRATE 1 MG PO TABS: ORAL_TABLET | ORAL | 2 refills | Status: DC

## 2017-05-28 MED ORDER — RANITIDINE HCL 300 MG PO TABS: ORAL_TABLET | ORAL | 1 refills | Status: DC

## 2017-05-28 NOTE — Patient Instructions (Signed)

## 2017-05-29 LAB — CBC WITH DIFFERENTIAL/PLATELET
BASOS ABS: 41 {cells}/uL (ref 0–200)
BASOS PCT: 0.4 %
EOS ABS: 185 {cells}/uL (ref 15–500)
EOS PCT: 1.8 %
HEMATOCRIT: 46.2 % (ref 38.5–50.0)
HEMOGLOBIN: 16.3 g/dL (ref 13.2–17.1)
LYMPHS ABS: 4172 {cells}/uL — AB (ref 850–3900)
MCH: 30.8 pg (ref 27.0–33.0)
MCHC: 35.3 g/dL (ref 32.0–36.0)
MCV: 87.2 fL (ref 80.0–100.0)
MPV: 10.6 fL (ref 7.5–12.5)
Monocytes Relative: 7.5 %
NEUTROS ABS: 5129 {cells}/uL (ref 1500–7800)
Neutrophils Relative %: 49.8 %
Platelets: 241 10*3/uL (ref 140–400)
RBC: 5.3 10*6/uL (ref 4.20–5.80)
RDW: 12.8 % (ref 11.0–15.0)
Total Lymphocyte: 40.5 %
WBC mixed population: 773 cells/uL (ref 200–950)
WBC: 10.3 10*3/uL (ref 3.8–10.8)

## 2017-05-29 LAB — HEPATIC FUNCTION PANEL
AG RATIO: 1.8 (calc) (ref 1.0–2.5)
ALKALINE PHOSPHATASE (APISO): 59 U/L (ref 40–115)
ALT: 35 U/L (ref 9–46)
AST: 18 U/L (ref 10–35)
Albumin: 4.4 g/dL (ref 3.6–5.1)
BILIRUBIN DIRECT: 0.1 mg/dL (ref 0.0–0.2)
BILIRUBIN INDIRECT: 0.2 mg/dL (ref 0.2–1.2)
BILIRUBIN TOTAL: 0.3 mg/dL (ref 0.2–1.2)
GLOBULIN: 2.5 g/dL (ref 1.9–3.7)
Total Protein: 6.9 g/dL (ref 6.1–8.1)

## 2017-05-29 LAB — LIPID PANEL
CHOL/HDL RATIO: 4 (calc) (ref <5.0)
Cholesterol: 113 mg/dL (ref <200)
HDL: 28 mg/dL — AB (ref >40)
LDL Cholesterol (Calc): 58 mg/dL (calc)
NON-HDL CHOLESTEROL (CALC): 85 mg/dL (ref <130)
Triglycerides: 206 mg/dL — ABNORMAL HIGH (ref <150)

## 2017-05-29 LAB — BASIC METABOLIC PANEL WITH GFR
BUN: 20 mg/dL (ref 7–25)
CO2: 26 mmol/L (ref 20–32)
CREATININE: 0.9 mg/dL (ref 0.70–1.33)
Calcium: 9.7 mg/dL (ref 8.6–10.3)
Chloride: 100 mmol/L (ref 98–110)
GFR, EST AFRICAN AMERICAN: 112 mL/min/{1.73_m2} (ref >=60)
GFR, EST NON AFRICAN AMERICAN: 96 mL/min/{1.73_m2} (ref >=60)
GLUCOSE: 79 mg/dL (ref 65–99)
Potassium: 4.2 mmol/L (ref 3.5–5.3)
SODIUM: 136 mmol/L (ref 135–146)

## 2017-05-29 LAB — HEMOGLOBIN A1C
HEMOGLOBIN A1C: 6.1 %{Hb} — AB (ref <5.7)
Mean Plasma Glucose: 128 (calc)
eAG (mmol/L): 7.1 (calc)

## 2017-05-29 LAB — VITAMIN D 25 HYDROXY (VIT D DEFICIENCY, FRACTURES): VIT D 25 HYDROXY: 63 ng/mL (ref 30–100)

## 2017-05-29 LAB — TSH: TSH: 2.19 m[IU]/L (ref 0.40–4.50)

## 2017-06-22 DIAGNOSIS — E119 Type 2 diabetes mellitus without complications: Secondary | ICD-10-CM | POA: Diagnosis not present

## 2017-06-22 DIAGNOSIS — E039 Hypothyroidism, unspecified: Secondary | ICD-10-CM | POA: Diagnosis not present

## 2017-06-22 DIAGNOSIS — M549 Dorsalgia, unspecified: Secondary | ICD-10-CM | POA: Diagnosis not present

## 2017-08-27 ENCOUNTER — Ambulatory Visit: Payer: 59 | Admitting: Internal Medicine

## 2017-08-27 ENCOUNTER — Encounter: Payer: Self-pay | Admitting: Internal Medicine

## 2017-08-27 VITALS — BP 122/76 | HR 88 | Temp 97.3°F | Resp 16 | Ht 70.0 in | Wt 186.4 lb

## 2017-08-27 DIAGNOSIS — E782 Mixed hyperlipidemia: Secondary | ICD-10-CM | POA: Diagnosis not present

## 2017-08-27 DIAGNOSIS — E039 Hypothyroidism, unspecified: Secondary | ICD-10-CM

## 2017-08-27 DIAGNOSIS — Z79899 Other long term (current) drug therapy: Secondary | ICD-10-CM | POA: Diagnosis not present

## 2017-08-27 DIAGNOSIS — E559 Vitamin D deficiency, unspecified: Secondary | ICD-10-CM | POA: Diagnosis not present

## 2017-08-27 DIAGNOSIS — E349 Endocrine disorder, unspecified: Secondary | ICD-10-CM

## 2017-08-27 DIAGNOSIS — I10 Essential (primary) hypertension: Secondary | ICD-10-CM | POA: Diagnosis not present

## 2017-08-27 DIAGNOSIS — E1129 Type 2 diabetes mellitus with other diabetic kidney complication: Secondary | ICD-10-CM | POA: Diagnosis not present

## 2017-08-27 NOTE — Patient Instructions (Signed)

## 2017-08-27 NOTE — Progress Notes (Signed)
This very nice 57 y.o. single WM presents for 6 month follow up with HTN, HLD, Pre-Diabetes and Vitamin D Deficiency. Patient also has hx/o Cx DDD s/p surgery.      Patient is treated for HTN (2014) & BP has been controlled at home. Today's BP is at goal - 122/76. Patient has had no complaints of any cardiac type chest pain, palpitations, dyspnea / orthopnea / PND, dizziness, claudication, or dependent edema.     Hyperlipidemia is controlled with diet & meds. Patient denies myalgias or other med SE's. Last Lipids were at goal albeit elevated Trig's: Lab Results  Component Value Date   CHOL 113 05/28/2017   HDL 28 (L) 05/28/2017   LDLCALC 58 05/28/2017   TRIG 206 (H) 05/28/2017   CHOLHDL 4.0 05/28/2017      Also, the patient has history of T2_NIDDM (2013) and is on MF  and has had no symptoms of reactive hypoglycemia, diabetic polys, paresthesias or visual blurring.  Last A1c was not at goal: Lab Results  Component Value Date   HGBA1C 6.1 (H) 05/28/2017      Patient is on Parenteral Testosterone Replacement.  Also, patient has been on Thyroid Replacement since 2015. Further, the patient also has history of Vitamin D Deficiency ("33"/2013) and supplements vitamin D without any suspected side-effects. Last vitamin D was at goal:  Lab Results  Component Value Date   VD25OH 63 05/28/2017   Current Outpatient Medications on File Prior to Visit  Medication Sig  . B-D 3CC LUER-LOK SYR 21GX1" 21G X 1" 3 ML MISC USE AS DIRECTED  . cetirizine (ZYRTEC) 10 MG tablet Take 10 mg by mouth daily.  . Cholecalciferol (VITAMIN D-3) 5000 UNITS TABS Take 10,000 Units by mouth daily.  . cyclobenzaprine (FLEXERIL) 10 MG tablet Take 10 mg by mouth daily as needed for muscle spasms. Takes prn  . dextroamphetamine (DEXEDRINE SPANSULE) 15 MG 24 hr capsule Take 15 mg by mouth daily as needed.  Marland Kitchen levothyroxine (SYNTHROID, LEVOTHROID) 50 MCG tablet TAKE 1 TABLET BY MOUTH ONCE DAILY BEFORE BREAKFAST  .  losartan (COZAAR) 50 MG tablet Take 50 mg by mouth daily.   . metFORMIN (GLUCOPHAGE-XR) 500 MG 24 hr tablet TAKE 2 TABLETS TWICE A DAY FOR DIABETES  . ONE TOUCH ULTRA TEST test strip TEST BLOOD SUGAR 3 TIMES A DAY  . ONETOUCH DELICA LANCETS 73U MISC USE TO CHECK SUGAR DAILY  . ranitidine (ZANTAC) 300 MG tablet Twice daily  . testosterone cypionate (DEPOTESTOTERONE CYPIONATE) 200 MG/ML injection USE 2ML INTRAMUSCULARLY EVERY 2 WEEKS AS DIRECTED  . varenicline (CHANTIX CONTINUING MONTH PAK) 1 MG tablet TAKE AS DIRECTED  . vitamin C (ASCORBIC ACID) 500 MG tablet Take 2,000 mg by mouth daily.   . Zinc 50 MG TABS Take by mouth.   No current facility-administered medications on file prior to visit.    No Known Allergies PMHx:   Past Medical History:  Diagnosis Date  . DDD (degenerative disc disease)   . Diabetes mellitus without complication ([place])   . GERD (gastroesophageal reflux disease)   . Hyperlipidemia   . Hypertension   . Hypogonadism male   . Vitamin D deficiency    Immunization History  Administered Date(s) Administered  . DTaP 04/04/2001, 04/24/2008  . Influenza Inj Mdck Quad With Preservative 02/21/2017  . Influenza Whole 01/09/2013  . Influenza,inj,quad, With Preservative 02/17/2016  . PPD Test 10/09/2013, 10/13/2014, 11/17/2015, 02/21/2017  . Pneumococcal Polysaccharide-23 02/21/2017  . Tdap 07/17/2013  Past Surgical History:  Procedure Laterality Date  . 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 CERVICAL FUSION  1991  . ESOPHAGOGASTRODUODENOSCOPY  1987,1992,1993,1995   duodenal ulcer  . LUMBAR DISC SURGERY    . LUMBAR FUSION  2001  . LUMBAR LAMINECTOMY  7/97   FHx:    Reviewed / unchanged  SHx:    Reviewed / unchanged   Systems Review:  Constitutional: Denies fever, chills, wt changes, headaches, insomnia, fatigue, night sweats, change in appetite. Eyes: Denies redness, blurred vision, diplopia, discharge, itchy, watery eyes.  ENT: Denies discharge, congestion,  post nasal drip, epistaxis, sore throat, earache, hearing loss, dental pain, tinnitus, vertigo, sinus pain, snoring.  CV: Denies chest pain, palpitations, irregular heartbeat, syncope, dyspnea, diaphoresis, orthopnea, PND, claudication or edema. Respiratory: denies cough, dyspnea, DOE, pleurisy, hoarseness, laryngitis, wheezing.  Gastrointestinal: Denies dysphagia, odynophagia, heartburn, reflux, water brash, abdominal pain or cramps, nausea, vomiting, bloating, diarrhea, constipation, hematemesis, melena, hematochezia  or hemorrhoids. Genitourinary: Denies dysuria, frequency, urgency, nocturia, hesitancy, discharge, hematuria or flank pain. Musculoskeletal: Denies arthralgias, myalgias, stiffness, jt. swelling, pain, limping or strain/sprain.  Skin: Denies pruritus, rash, hives, warts, acne, eczema or change in skin lesion(s). Neuro: No weakness, tremor, incoordination, spasms, paresthesia or pain. Psychiatric: Denies confusion, memory loss or sensory loss. Endo: Denies change in weight, skin or hair change.  Heme/Lymph: No excessive bleeding, bruising or enlarged lymph nodes.  Physical Exam  BP 122/76   Pulse 88   Temp (!) 97.3 F (36.3 C)   Resp 16   Ht 5\' 10"  (1.778 m)   Wt 186 lb 6.4 oz (84.6 kg)   BMI 26.75 kg/m   Appears  well nourished, well groomed  and in no distress.  Eyes: PERRLA, EOMs, conjunctiva no swelling or erythema. Sinuses: No frontal/maxillary tenderness ENT/Mouth: EAC's clear, TM's nl w/o erythema, bulging. Nares clear w/o erythema, swelling, exudates. Oropharynx clear without erythema or exudates. Oral hygiene is good. Tongue normal, non obstructing. Hearing intact.  Neck: Supple. Thyroid not palpable. Car 2+/2+ without bruits, nodes or JVD. Chest: Respirations nl with BS clear & equal w/o rales, rhonchi, wheezing or stridor.  Cor: Heart sounds normal w/ regular rate and rhythm without sig. murmurs, gallops, clicks or rubs. Peripheral pulses normal and equal   without edema.  Abdomen: Soft & bowel sounds normal. Non-tender w/o guarding, rebound, hernias, masses or organomegaly.  Lymphatics: Unremarkable.  Musculoskeletal: Full ROM all peripheral extremities, joint stability, 5/5 strength and normal gait.  Skin: Warm, dry without exposed rashes, lesions or ecchymosis apparent.  Neuro: Cranial nerves intact, reflexes equal bilaterally. Sensory-motor testing grossly intact. Tendon reflexes grossly intact.  Pysch: Alert & oriented x 3.  Insight and judgement nl & appropriate. No ideations.  Assessment and Plan:  1. Essential hypertension  - Continue medication, monitor blood pressure at home.  - Continue DASH diet.  Reminder to go to the ER if any CP,  SOB, nausea, dizziness, severe HA, changes vision/speech.  - CBC with Differential/Platelet - COMPLETE METABOLIC PANEL WITH GFR - Magnesium - TSH  2. Hyperlipidemia  - Continue diet/meds, exercise,& lifestyle modifications.  - Continue monitor periodic cholesterol/liver & renal functions   - Lipid panel - TSH  3. 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   - Continue diet, exercise,  lifestyle modifications.  - Monitor appropriate labs.  - Hemoglobin A1c - Insulin, random  4. Vitamin D Deficiency  - Continue supplementation.  5.  Hypothyroidism   - TSH  5. Testosterone deficiency  - Testosterone  6. Medication management  - CBC with Differential/Platelet - COMPLETE METABOLIC PANEL WITH GFR - Magnesium - Lipid panel - TSH - Hemoglobin A1c - Insulin, random - Testosterone          Discussed  regular exercise, BP monitoring, weight control to achieve/maintain BMI less than 25 and discussed med and SE's. Recommended labs to assess and monitor clinical status with further disposition pending  results of labs. Over 30 minutes of exam, counseling, chart review was performed.

## 2017-08-28 LAB — CBC WITH DIFFERENTIAL/PLATELET
BASOS PCT: 0.7 %
Basophils Absolute: 60 cells/uL (ref 0–200)
EOS ABS: 68 {cells}/uL (ref 15–500)
EOS PCT: 0.8 %
HCT: 41.7 % (ref 38.5–50.0)
HEMOGLOBIN: 14.4 g/dL (ref 13.2–17.1)
Lymphs Abs: 3179 cells/uL (ref 850–3900)
MCH: 30.4 pg (ref 27.0–33.0)
MCHC: 34.5 g/dL (ref 32.0–36.0)
MCV: 88 fL (ref 80.0–100.0)
MONOS PCT: 8 %
MPV: 11.1 fL (ref 7.5–12.5)
NEUTROS ABS: 4514 {cells}/uL (ref 1500–7800)
Neutrophils Relative %: 53.1 %
PLATELETS: 232 10*3/uL (ref 140–400)
RBC: 4.74 10*6/uL (ref 4.20–5.80)
RDW: 12.9 % (ref 11.0–15.0)
TOTAL LYMPHOCYTE: 37.4 %
WBC mixed population: 680 cells/uL (ref 200–950)
WBC: 8.5 10*3/uL (ref 3.8–10.8)

## 2017-08-28 LAB — COMPLETE METABOLIC PANEL WITH GFR
AG Ratio: 2 (calc) (ref 1.0–2.5)
ALBUMIN MSPROF: 4.6 g/dL (ref 3.6–5.1)
ALKALINE PHOSPHATASE (APISO): 69 U/L (ref 40–115)
ALT: 22 U/L (ref 9–46)
AST: 23 U/L (ref 10–35)
BILIRUBIN TOTAL: 0.5 mg/dL (ref 0.2–1.2)
BUN: 22 mg/dL (ref 7–25)
CHLORIDE: 103 mmol/L (ref 98–110)
CO2: 28 mmol/L (ref 20–32)
CREATININE: 1.05 mg/dL (ref 0.70–1.33)
Calcium: 9.4 mg/dL (ref 8.6–10.3)
GFR, Est African American: 93 mL/min/{1.73_m2} (ref >=60)
GFR, Est Non African American: 80 mL/min/{1.73_m2} (ref >=60)
GLUCOSE: 81 mg/dL (ref 65–99)
Globulin: 2.3 g/dL (calc) (ref 1.9–3.7)
Potassium: 4.4 mmol/L (ref 3.5–5.3)
Sodium: 136 mmol/L (ref 135–146)
Total Protein: 6.9 g/dL (ref 6.1–8.1)

## 2017-08-28 LAB — LIPID PANEL
CHOLESTEROL: 81 mg/dL (ref <200)
HDL: 34 mg/dL — ABNORMAL LOW (ref >40)
LDL Cholesterol (Calc): 31 mg/dL (calc)
Non-HDL Cholesterol (Calc): 47 mg/dL (calc) (ref <130)
TRIGLYCERIDES: 79 mg/dL (ref <150)
Total CHOL/HDL Ratio: 2.4 (calc) (ref <5.0)

## 2017-08-28 LAB — HEMOGLOBIN A1C
EAG (MMOL/L): 6.2 (calc)
Hgb A1c MFr Bld: 5.5 % of total Hgb (ref <5.7)
Mean Plasma Glucose: 111 (calc)

## 2017-08-28 LAB — MAGNESIUM: MAGNESIUM: 2 mg/dL (ref 1.5–2.5)

## 2017-08-28 LAB — TESTOSTERONE: TESTOSTERONE: 320 ng/dL (ref 250–827)

## 2017-08-28 LAB — TSH: TSH: 2.06 mIU/L (ref 0.40–4.50)

## 2017-08-28 LAB — INSULIN, RANDOM: INSULIN: 4.1 u[IU]/mL (ref 2.0–19.6)

## 2017-11-13 ENCOUNTER — Ambulatory Visit (HOSPITAL_COMMUNITY)
Admission: RE | Admit: 2017-11-13 | Discharge: 2017-11-13 | Disposition: A | Discharge status: Home / Self Care | Payer: 59 | Source: Ambulatory Visit | Attending: Adult Health | Admitting: Adult Health

## 2017-11-13 ENCOUNTER — Ambulatory Visit: Payer: 59 | Admitting: Adult Health

## 2017-11-13 ENCOUNTER — Encounter: Payer: Self-pay | Admitting: Adult Health

## 2017-11-13 VITALS — BP 130/74 | HR 80 | Temp 97.9°F | Ht 70.0 in | Wt 185.0 lb

## 2017-11-13 DIAGNOSIS — J984 Other disorders of lung: Secondary | ICD-10-CM | POA: Insufficient documentation

## 2017-11-13 DIAGNOSIS — R1013 Epigastric pain: Secondary | ICD-10-CM

## 2017-11-13 DIAGNOSIS — R0600 Dyspnea, unspecified: Secondary | ICD-10-CM | POA: Insufficient documentation

## 2017-11-13 DIAGNOSIS — R079 Chest pain, unspecified: Secondary | ICD-10-CM | POA: Diagnosis not present

## 2017-11-13 LAB — TROPONIN I: TROPONIN I: 0.01 ng/mL (ref <=0.0)

## 2017-11-13 MED ORDER — SUCRALFATE 1 G PO TABS: 1.0000 g | ORAL_TABLET | Freq: Three times a day (TID) | ORAL | 1 refills | Status: DC

## 2017-11-13 NOTE — Progress Notes (Addendum)
Assessment and Plan:  Paul Gates was seen today for shortness of breath, abdominal pain and chest pain.  Diagnoses and all orders for this visit:  Left-sided chest pain/Epigastric abdominal pain EKG WNL, Low degree of suspicion of cardiac/respiratory origin, rule out PE, pancreatitis/gallbladder, suspect gastritis, carafate sent in, will restart on omeprazole x 2 weeks -     CBC with Differential/Platelet -     COMPLETE METABOLIC PANEL WITH GFR -     Urinalysis w microscopic + reflex cultur -     Troponin I -     D-dimer, quantitative (not at [facility]) -     Lipase -     Amylase -     DG Chest 2 View; Future  Dyspnea, unspecified type -     CBC with Differential/Platelet -     Troponin I -     D-dimer, quantitative (not at [facility]) -     DG Chest 2 View; Future  Further disposition pending results of labs. Discussed med's effects and SE's.   Over 30 minutes of exam, counseling, chart review, and critical decision making was performed.   Future Appointments  Date Time Provider [place]  12/12/2017  3:45 PM Liane Comber, NP [facility] None  04/02/2018  3:00 PM [name], MD [facility] None    ------------------------------------------------------------------------------------------------------------------   HPI BP 130/74   Pulse 80   Temp 97.9 F (36.6 C)   Ht 5\' 10"  (1.778 m)   Wt 185 lb (83.9 kg)   SpO2 96%   BMI 26.54 kg/m   57 y.o.male smoker, presents feeling "unwell" - reports he woke up last night with chest pain, abdominal pain, shortness of breath, feeling like he couldn't get a dep breath in, reports still having epigastric/generalized abdominal pain and pain under left ribcage, still having pain with deep breaths right now. He reports he took some pepto-bismol which he feels did help, got up and walked around, started belching and started feeling better. Currently reports he still has LUQ/left lower chest pain, left shoulder pain, worse with deep  breaths. He reports pain is a constant gnawing, currently 6/10, has not had pepto bismol today.   He does have hx of GERD, currently takes ranitidine 150 mg twice daily. He has hx of H. Pylori in 90s without recurrence. He was recently transitioned off of omeprazole to ranitidine.   He also endorse he was recently [place] the mountains, was having BM irregularity. Has been somewhat constipated.  Past Medical History:  Diagnosis Date  . DDD (degenerative disc disease)   . Diabetes mellitus without complication ([place])   . GERD (gastroesophageal reflux disease)   . Hyperlipidemia   . Hypertension   . Hypogonadism male   . Vitamin D deficiency      No Known Allergies  Current Outpatient Medications on File Prior to Visit  Medication Sig  . B-D 3CC LUER-LOK SYR 21GX1" 21G X 1" 3 ML MISC USE AS DIRECTED  . cetirizine (ZYRTEC) 10 MG tablet Take 10 mg by mouth daily.  . Cholecalciferol (VITAMIN D-3) 5000 UNITS TABS Take 10,000 Units by mouth daily.  . cyclobenzaprine (FLEXERIL) 10 MG tablet Take 10 mg by mouth daily as needed for muscle spasms. Takes prn  . dextroamphetamine (DEXEDRINE SPANSULE) 15 MG 24 hr capsule Take 15 mg by mouth daily as needed.  Marland Kitchen levothyroxine (SYNTHROID, LEVOTHROID) 50 MCG tablet TAKE 1 TABLET BY MOUTH ONCE DAILY BEFORE BREAKFAST  . losartan (COZAAR) 50 MG tablet Take 50 mg by  mouth daily.   . metFORMIN (GLUCOPHAGE-XR) 500 MG 24 hr tablet TAKE 2 TABLETS TWICE A DAY FOR DIABETES  . ONE TOUCH ULTRA TEST test strip TEST BLOOD SUGAR 3 TIMES A DAY  . ONETOUCH DELICA LANCETS 89V MISC USE TO CHECK SUGAR DAILY  . ranitidine (ZANTAC) 300 MG tablet Twice daily (Patient taking differently: 150 mg. Twice daily)  . testosterone cypionate (DEPOTESTOTERONE CYPIONATE) 200 MG/ML injection USE 2ML INTRAMUSCULARLY EVERY 2 WEEKS AS DIRECTED  . varenicline (CHANTIX CONTINUING MONTH PAK) 1 MG tablet TAKE AS DIRECTED  . vitamin C (ASCORBIC ACID) 500 MG tablet Take 2,000 mg by mouth  daily.   . Zinc 50 MG TABS Take by mouth.   No current facility-administered medications on file prior to visit.     ROS: Review of Systems  Constitutional: Negative for chills, diaphoresis, fever, malaise/fatigue and weight loss.  HENT: Negative.  Negative for sore throat.   Eyes: Negative.   Respiratory: Positive for shortness of breath. Negative for cough, hemoptysis, sputum production and wheezing.   Cardiovascular: Positive for chest pain. Negative for palpitations, orthopnea, claudication and PND.  Gastrointestinal: Positive for abdominal pain, constipation and heartburn. Negative for blood in stool, diarrhea, melena, nausea and vomiting.  Genitourinary: Negative.   Musculoskeletal: Positive for joint pain (left shoulder, left chest).  Skin: Negative for rash.  Neurological: Negative for dizziness and headaches.  Endo/Heme/Allergies: Negative for polydipsia.    Physical Exam:  BP 130/74   Pulse 80   Temp 97.9 F (36.6 C)   Ht 5\' 10"  (1.778 m)   Wt 185 lb (83.9 kg)   SpO2 96%   BMI 26.54 kg/m   General Appearance: Well nourished, in no apparent distress. Eyes: PERRLA, EOMs, conjunctiva no swelling or erythema Sinuses: No Frontal/maxillary tenderness ENT/Mouth: Ext aud canals clear, TMs without erythema, bulging. No erythema, swelling, or exudate on post pharynx.  Tonsils not swollen or erythematous. Hearing normal.  Neck: Supple, thyroid normal.  Respiratory: Respiratory effort normal, BS equal bilaterally without rales, rhonchi, wheezing or stridor. He does complain of increased chest pain with deep inspiration Cardio: RRR with no MRGs. Brisk peripheral pulses without edema.  Abdomen: Soft, ? Mildly distended, + BS.  + epigastric tenderness, no guarding, rebound, hernias, masses. Lymphatics: Non tender without lymphadenopathy.  Musculoskeletal: Full ROM, 5/5 strength, normal gait.  Skin: Warm, dry without rashes, lesions, ecchymosis.  Neuro: Cranial nerves intact.  Normal muscle tone, no cerebellar symptoms. Sensation intact.  Psych: Awake and oriented X 3, normal affect, Insight and Judgment appropriate.     Izora Ribas, NP 11:09 AM [facility]

## 2017-11-13 NOTE — Patient Instructions (Addendum)
  Try dissolving the carafate in small amount of water and drinking to help coat your esophagus  Get back on prilosec for 2 weeks, then can try to taper back down to as needed use   Gastritis, Adult Gastritis is inflammation of the stomach. There are two kinds of gastritis:  Acute gastritis. This kind develops suddenly.  Chronic gastritis. This kind lasts for a long time.  Gastritis happens when the lining of the stomach becomes weak or gets damaged. Without treatment, gastritis can lead to stomach bleeding and ulcers. What are the causes? This condition may be caused by:  An infection.  Drinking too much alcohol.  Certain medicines.  Having too much acid in the stomach.  A disease of the intestines or stomach.  Stress.  What are the signs or symptoms? Symptoms of this condition include:  Pain or a burning in the upper abdomen.  Nausea.  Vomiting.  An uncomfortable feeling of fullness after eating.  In some cases, there are no symptoms. How is this diagnosed? This condition may be diagnosed with:  A description of your symptoms.  A physical exam.  Tests. These can include: ? Blood tests. ? Stool tests. ? A test in which a thin, flexible instrument with a light and camera on the end is passed down the esophagus and into the stomach (upper endoscopy). ? A test in which a sample of tissue is taken for testing (biopsy).  How is this treated? This condition may be treated with medicines. If the condition is caused by a bacterial infection, you may be given antibiotic medicines. If it is caused by too much acid in the stomach, you may get medicines called H2 blockers, proton pump inhibitors, or antacids. Treatment may also involve stopping the use of certain medicines, such as aspirin, ibuprofen, or other nonsteroidal anti-inflammatory drugs (NSAIDs). Follow these instructions at home:  Take over-the-counter and prescription medicines only as told by your health  care provider.  If you were prescribed an antibiotic, take it as told by your health care provider. Do not stop taking the antibiotic even if you start to feel better.  Drink enough fluid to keep your urine clear or pale yellow.  Eat small, frequent meals instead of large meals. Contact a health care provider if:  Your symptoms get worse.  Your symptoms return after treatment. Get help right away if:  You vomit blood or material that looks like coffee grounds.  You have black or dark red stools.  You are unable to keep fluids down.  Your abdominal pain gets worse.  You have a fever.  You do not feel better after 1 week. This information is not intended to replace advice given to you by your health care provider. Make sure you discuss any questions you have with your health care provider. Document Released: 04/04/2001 Document Revised: 12/08/2015 Document Reviewed: 01/02/2015 Elsevier Interactive Patient Education  [employer].

## 2017-11-14 LAB — URINALYSIS W MICROSCOPIC + REFLEX CULTURE
BACTERIA UA: NONE SEEN /HPF
BILIRUBIN URINE: NEGATIVE
Glucose, UA: NEGATIVE
Hgb urine dipstick: NEGATIVE
Hyaline Cast: NONE SEEN /LPF
KETONES UR: NEGATIVE
LEUKOCYTE ESTERASE: NEGATIVE
NITRITES URINE, INITIAL: NEGATIVE
PROTEIN: NEGATIVE
SQUAMOUS EPITHELIAL / LPF: NONE SEEN /HPF (ref <=5)
Specific Gravity, Urine: 1.024 (ref 1.001–1.03)
WBC, UA: NONE SEEN /HPF (ref 0–5)
pH: 6.5 (ref 5.0–8.0)

## 2017-11-14 LAB — COMPLETE METABOLIC PANEL WITH GFR
AG Ratio: 2 (calc) (ref 1.0–2.5)
ALT: 22 U/L (ref 9–46)
AST: 15 U/L (ref 10–35)
Albumin: 4.7 g/dL (ref 3.6–5.1)
Alkaline phosphatase (APISO): 73 U/L (ref 40–115)
BUN: 15 mg/dL (ref 7–25)
CALCIUM: 9.6 mg/dL (ref 8.6–10.3)
CO2: 26 mmol/L (ref 20–32)
CREATININE: 1 mg/dL (ref 0.70–1.33)
Chloride: 103 mmol/L (ref 98–110)
GFR, EST AFRICAN AMERICAN: 98 mL/min/{1.73_m2} (ref >=60)
GFR, EST NON AFRICAN AMERICAN: 85 mL/min/{1.73_m2} (ref >=60)
GLOBULIN: 2.4 g/dL (ref 1.9–3.7)
Glucose, Bld: 143 mg/dL — ABNORMAL HIGH (ref 65–99)
Potassium: 4.4 mmol/L (ref 3.5–5.3)
SODIUM: 138 mmol/L (ref 135–146)
Total Bilirubin: 0.7 mg/dL (ref 0.2–1.2)
Total Protein: 7.1 g/dL (ref 6.1–8.1)

## 2017-11-14 LAB — CBC WITH DIFFERENTIAL/PLATELET
BASOS PCT: 0.2 %
Basophils Absolute: 29 cells/uL (ref 0–200)
EOS PCT: 0.2 %
Eosinophils Absolute: 29 cells/uL (ref 15–500)
HEMATOCRIT: 46.7 % (ref 38.5–50.0)
Hemoglobin: 15.8 g/dL (ref 13.2–17.1)
LYMPHS ABS: 1759 {cells}/uL (ref 850–3900)
MCH: 29.4 pg (ref 27.0–33.0)
MCHC: 33.8 g/dL (ref 32.0–36.0)
MCV: 87 fL (ref 80.0–100.0)
MPV: 11.3 fL (ref 7.5–12.5)
Monocytes Relative: 7.9 %
NEUTROS PCT: 79.4 %
Neutro Abs: 11354 cells/uL — ABNORMAL HIGH (ref 1500–7800)
Platelets: 231 10*3/uL (ref 140–400)
RBC: 5.37 10*6/uL (ref 4.20–5.80)
RDW: 12.8 % (ref 11.0–15.0)
Total Lymphocyte: 12.3 %
WBC: 14.3 10*3/uL — AB (ref 3.8–10.8)
WBCMIX: 1130 {cells}/uL — AB (ref 200–950)

## 2017-11-14 LAB — D-DIMER, QUANTITATIVE (NOT AT ARMC): D DIMER QUANT: 0.29 ug{FEU}/mL (ref <0.50)

## 2017-11-14 LAB — LIPASE: Lipase: 26 U/L (ref 7–60)

## 2017-11-14 LAB — AMYLASE: AMYLASE: 51 U/L (ref 21–101)

## 2017-11-14 LAB — NO CULTURE INDICATED

## 2017-11-19 NOTE — Addendum Note (Signed)
Addended by: Izora Ribas on: 11/19/2017 08:29 AM   Modules accepted: Orders

## 2017-12-11 NOTE — Progress Notes (Signed)
` FOLLOW UP  Assessment and Plan:   Hypertension Well controlled with current medications  Monitor blood pressure at home; patient to call if consistently greater than 130/80 Continue DASH diet.   Reminder to go to the ER if any CP, SOB, nausea, dizziness, severe HA, changes vision/speech, left arm numbness and tingling and jaw pain.   Cholesterol Currently aggressively controlled with only fish oil supplement Continue low cholesterol diet and exercise.  Check lipid panel.   Diabetes with diabetic chronic kidney disease Continue medication: metformin 2 tabs daily  Continue diet and exercise, progress with weight loss Perform daily foot/skin check, notify office of any concerning changes.  Check A1C  Obesity with co morbidities Long discussion about weight loss, diet, and exercise Recommended diet heavy in fruits and veggies and low in animal meats, cheeses, and dairy products, appropriate calorie intake Discussed ideal weight for height and initial weight goal (175 lb) Patient will work on continue to make small good choices, continue with current slow steady weight loss Will follow up in 3 months  Vitamin D Def At goal at last chec; continue supplementation Check biannual Vit D level  Hypogonadism - continue to monitor, states medication is helping with symptoms of low T.   Hypothyroidism continue medications the same: reminded to take on an empty stomach 30-88mins before food.  check TSH level  Tobacco use Discussed risks associated with tobacco use and advised to reduce or quit Patient is ready to do so and plans to continue chantix Will follow up at the next visit  ADD Continue medications Helps with focus, no AE's; uses rarely The patient was counseled on the addictive nature of the medication and was encouraged to take drug holidays when not needed.    Continue diet and meds as discussed. Further disposition pending results of labs. Discussed med's effects  and SE's.   Over 30 minutes of exam, counseling, chart review, and critical decision making was performed.   Future Appointments  Date Time Provider [place]  04/02/2018  3:00 PM [name], MD [facility] None    ----------------------------------------------------------------------------------------------------------------------  HPI 57 y.o. male  presents for 3 month follow up on hypertension, cholesterol, T2 diabetes, weight, hypothyroid, tobacco use, hypogonadism on testosterone supplement, ADD and vitamin D deficiency.    he currently continues to smoke 4-5 cigarettes a day (down from 0.75 pack daily); discussed risks associated with smoking, patient is ready to quit. He was prescribed chantix and has recently restarted with goal to cut out.   Patient is on an ADD medication, he states that the medication is helping and he denies any adverse reactions. He takes 15 mg dextroamphetamine PRN; he takes very rarely - typically 1-2 occasions a month.   BMI is Body mass index is 26.34 kg/m., he has been working on diet and exercise. He does plan to start biking again.  Wt Readings from Last 3 Encounters:  12/12/17 183 lb 9.6 oz (83.3 kg)  11/13/17 185 lb (83.9 kg)  08/27/17 186 lb 6.4 oz (84.6 kg)   His blood pressure has been controlled at home, today their BP is BP: 124/68  He does workout. He denies chest pain, shortness of breath, dizziness.   He is not on cholesterol medication and denies myalgias. His cholesterol is at goal. The cholesterol last visit was:   Lab Results  Component Value Date   CHOL 81 08/27/2017   HDL 34 (L) 08/27/2017   LDLCALC 31 08/27/2017   TRIG 79 08/27/2017  CHOLHDL 2.4 08/27/2017    He has been working on diet and exercise for T2DM (controlled in normal range on metformin -1 tab BID) and denies increased appetite, nausea, paresthesia of the feet, polydipsia, polyuria, visual disturbances and vomiting. Last A1C in the office was:  Lab  Results  Component Value Date   HGBA1C 5.5 08/27/2017   He is on thyroid medication. His medication was not changed last visit.   Lab Results  Component Value Date   TSH 2.06 08/27/2017   Patient is on Vitamin D supplement and at goal at last check:    Lab Results  Component Value Date   VD25OH 63 05/28/2017     He has a history of testosterone deficiency and is on testosterone replacement, last shot 8/10th, He states that the testosterone helps with his energy, libido, muscle mass. Lab Results  Component Value Date   TESTOSTERONE 320 08/27/2017     Current Medications:  Current Outpatient Medications on File Prior to Visit  Medication Sig  . B-D 3CC LUER-LOK SYR 21GX1" 21G X 1" 3 ML MISC USE AS DIRECTED  . cetirizine (ZYRTEC) 10 MG tablet Take 10 mg by mouth daily.  . Cholecalciferol (VITAMIN D-3) 5000 UNITS TABS Take 10,000 Units by mouth daily.  . cyclobenzaprine (FLEXERIL) 10 MG tablet Take 10 mg by mouth daily as needed for muscle spasms. Takes prn  . dextroamphetamine (DEXEDRINE SPANSULE) 15 MG 24 hr capsule Take 15 mg by mouth daily as needed.  Marland Kitchen levothyroxine (SYNTHROID, LEVOTHROID) 50 MCG tablet TAKE 1 TABLET BY MOUTH ONCE DAILY BEFORE BREAKFAST  . losartan (COZAAR) 50 MG tablet Take 50 mg by mouth daily.   . metFORMIN (GLUCOPHAGE-XR) 500 MG 24 hr tablet TAKE 2 TABLETS TWICE A DAY FOR DIABETES  . ONE TOUCH ULTRA TEST test strip TEST BLOOD SUGAR 3 TIMES A DAY  . ONETOUCH DELICA LANCETS 48N MISC USE TO CHECK SUGAR DAILY  . ranitidine (ZANTAC) 300 MG tablet Twice daily (Patient taking differently: 150 mg. Twice daily)  . sucralfate (CARAFATE) 1 g tablet Take 1 tablet (1 g total) by mouth 4 (four) times daily -  with meals and at bedtime. As needed.  . testosterone cypionate (DEPOTESTOTERONE CYPIONATE) 200 MG/ML injection USE 2ML INTRAMUSCULARLY EVERY 2 WEEKS AS DIRECTED  . varenicline (CHANTIX CONTINUING MONTH PAK) 1 MG tablet TAKE AS DIRECTED  . vitamin C (ASCORBIC ACID)  500 MG tablet Take 2,000 mg by mouth daily.   . Zinc 50 MG TABS Take by mouth.   No current facility-administered medications on file prior to visit.      Allergies: No Known Allergies   Medical History:  Past Medical History:  Diagnosis Date  . DDD (degenerative disc disease)   . Diabetes mellitus without complication ([place])   . GERD (gastroesophageal reflux disease)   . Hyperlipidemia   . Hypertension   . Hypogonadism male   . Vitamin D deficiency    Family history- Reviewed and unchanged Social history- Reviewed and unchanged   Review of Systems:  Review of Systems  Constitutional: Negative for malaise/fatigue and weight loss.  HENT: Negative for hearing loss and tinnitus.   Eyes: Negative for blurred vision and double vision.  Respiratory: Negative for cough, shortness of breath and wheezing.   Cardiovascular: Negative for chest pain, palpitations, orthopnea, claudication and leg swelling.  Gastrointestinal: Negative for abdominal pain, blood in stool, constipation, diarrhea, heartburn, melena, nausea and vomiting.  Genitourinary: Negative.   Musculoskeletal: Negative for joint pain and myalgias.  Skin: Negative for rash.  Neurological: Negative for dizziness, tingling, sensory change, weakness and headaches.  Endo/Heme/Allergies: Negative for polydipsia.  Psychiatric/Behavioral: Negative.   All other systems reviewed and are negative.     Physical Exam: BP 124/68   Pulse 79   Temp 97.7 F (36.5 C)   Ht 5\' 10"  (1.778 m)   Wt 183 lb 9.6 oz (83.3 kg)   SpO2 98%   BMI 26.34 kg/m  Wt Readings from Last 3 Encounters:  12/12/17 183 lb 9.6 oz (83.3 kg)  11/13/17 185 lb (83.9 kg)  08/27/17 186 lb 6.4 oz (84.6 kg)   General Appearance: Well nourished, in no apparent distress. Eyes: PERRLA, EOMs, conjunctiva no swelling or erythema Sinuses: No Frontal/maxillary tenderness ENT/Mouth: Ext aud canals clear, TMs without erythema, bulging. No erythema, swelling, or  exudate on post pharynx.  Tonsils not swollen or erythematous. Hearing normal.  Neck: Supple, thyroid normal.  Respiratory: Respiratory effort normal, BS equal bilaterally without rales, rhonchi, wheezing or stridor.  Cardio: RRR with no MRGs. Brisk peripheral pulses without edema.  Abdomen: Soft, + BS.  Non tender, no guarding, rebound, hernias, masses. Lymphatics: Non tender without lymphadenopathy.  Musculoskeletal: Full ROM, 5/5 strength, Normal gait Skin: Warm, dry without rashes, lesions, ecchymosis.  Neuro: Cranial nerves intact. No cerebellar symptoms.  Psych: Awake and oriented X 3, normal affect, Insight and Judgment appropriate.    Izora Ribas, NP 3:56 PM [facility]

## 2017-12-12 ENCOUNTER — Ambulatory Visit (INDEPENDENT_AMBULATORY_CARE_PROVIDER_SITE_OTHER): Payer: 59 | Admitting: Adult Health

## 2017-12-12 ENCOUNTER — Encounter: Payer: Self-pay | Admitting: Adult Health

## 2017-12-12 VITALS — BP 124/68 | HR 79 | Temp 97.7°F | Ht 70.0 in | Wt 183.6 lb

## 2017-12-12 DIAGNOSIS — E039 Hypothyroidism, unspecified: Secondary | ICD-10-CM | POA: Diagnosis not present

## 2017-12-12 DIAGNOSIS — E1129 Type 2 diabetes mellitus with other diabetic kidney complication: Secondary | ICD-10-CM | POA: Diagnosis not present

## 2017-12-12 DIAGNOSIS — E1169 Type 2 diabetes mellitus with other specified complication: Secondary | ICD-10-CM

## 2017-12-12 DIAGNOSIS — F988 Other specified behavioral and emotional disorders with onset usually occurring in childhood and adolescence: Secondary | ICD-10-CM

## 2017-12-12 DIAGNOSIS — E663 Overweight: Secondary | ICD-10-CM

## 2017-12-12 DIAGNOSIS — I10 Essential (primary) hypertension: Secondary | ICD-10-CM

## 2017-12-12 DIAGNOSIS — E785 Hyperlipidemia, unspecified: Secondary | ICD-10-CM

## 2017-12-12 DIAGNOSIS — E559 Vitamin D deficiency, unspecified: Secondary | ICD-10-CM

## 2017-12-12 DIAGNOSIS — F17200 Nicotine dependence, unspecified, uncomplicated: Secondary | ICD-10-CM

## 2017-12-12 DIAGNOSIS — E349 Endocrine disorder, unspecified: Secondary | ICD-10-CM

## 2017-12-12 DIAGNOSIS — Z79899 Other long term (current) drug therapy: Secondary | ICD-10-CM

## 2017-12-12 NOTE — Patient Instructions (Signed)
Know what a healthy weight is for you (roughly BMI <25) and aim to maintain this  Aim for 7+ servings of fruits and vegetables daily  65-80+ fluid ounces of water or unsweet tea for healthy kidneys  Limit to max 1 drink of alcohol per day; avoid smoking/tobacco  Limit animal fats in diet for cholesterol and heart health - choose grass fed whenever available  Avoid highly processed foods, and foods high in saturated/trans fats  Aim for low stress - take time to unwind and care for your mental health  Aim for 150 min of moderate intensity exercise weekly for heart health, and weights twice weekly for bone health  Aim for 7-9 hours of sleep daily     Steps to Quit Smoking Smoking tobacco can be harmful to your health and can affect almost every organ in your body. Smoking puts you, and those around you, at risk for developing many serious chronic diseases. Quitting smoking is difficult, but it is one of the best things that you can do for your health. It is never too late to quit. What are the benefits of quitting smoking? When you quit smoking, you lower your risk of developing serious diseases and conditions, such as:  Lung cancer or lung disease, such as COPD.  Heart disease.  Stroke.  Heart attack.  Infertility.  Osteoporosis and bone fractures.  Additionally, symptoms such as coughing, wheezing, and shortness of breath may get better when you quit. You may also find that you get sick less often because your body is stronger at fighting off colds and infections. If you are pregnant, quitting smoking can help to reduce your chances of having a baby of low birth weight. How do I get ready to quit? When you decide to quit smoking, create a plan to make sure that you are successful. Before you quit:  Pick a date to quit. Set a date within the next two weeks to give you time to prepare.  Write down the reasons why you are quitting. Keep this list in places where you will see  it often, such as on your bathroom mirror or in your car or wallet.  Identify the people, places, things, and activities that make you want to smoke (triggers) and avoid them. Make sure to take these actions: ? Throw away all cigarettes at home, at work, and in your car. ? Throw away smoking accessories, such as Scientist, research (medical). ? Clean your car and make sure to empty the ashtray. ? Clean your home, including curtains and carpets.  Tell your family, friends, and coworkers that you are quitting. Support from your loved ones can make quitting easier.  Talk with your health care provider about your options for quitting smoking.  Find out what treatment options are covered by your health insurance.  What strategies can I use to quit smoking? Talk with your healthcare provider about different strategies to quit smoking. Some strategies include:  Quitting smoking altogether instead of gradually lessening how much you smoke over a period of time. Research shows that quitting "cold [place]" is more successful than gradually quitting.  Attending in-person counseling to help you build problem-solving skills. You are more likely to have success in quitting if you attend several counseling sessions. Even short sessions of 10 minutes can be effective.  Finding resources and support systems that can help you to quit smoking and remain smoke-free after you quit. These resources are most helpful when you use them often. They can  include: ? Online chats with a Social worker. ? Telephone quitlines. ? Careers information officer. ? Support groups or group counseling. ? Text messaging programs. ? Mobile phone applications.  Taking medicines to help you quit smoking. (If you are pregnant or breastfeeding, talk with your health care provider first.) Some medicines contain nicotine and some do not. Both types of medicines help with cravings, but the medicines that include nicotine help to relieve withdrawal  symptoms. Your health care provider may recommend: ? Nicotine patches, gum, or lozenges. ? Nicotine inhalers or sprays. ? Non-nicotine medicine that is taken by mouth.  Talk with your health care provider about combining strategies, such as taking medicines while you are also receiving in-person counseling. Using these two strategies together makes you more likely to succeed in quitting than if you used either strategy on its own. If you are pregnant or breastfeeding, talk with your health care provider about finding counseling or other support strategies to quit smoking. Do not take medicine to help you quit smoking unless told to do so by your health care provider. What things can I do to make it easier to quit? Quitting smoking might feel overwhelming at first, but there is a lot that you can do to make it easier. Take these important actions:  Reach out to your family and friends and ask that they support and encourage you during this time. Call telephone quitlines, reach out to support groups, or work with a counselor for support.  Ask people who smoke to avoid smoking around you.  Avoid places that trigger you to smoke, such as bars, parties, or smoke-break areas at work.  Spend time around people who do not smoke.  Lessen stress in your life, because stress can be a smoking trigger for some people. To lessen stress, try: ? Exercising regularly. ? Deep-breathing exercises. ? Yoga. ? Meditating. ? Performing a body scan. This involves closing your eyes, scanning your body from head to toe, and noticing which parts of your body are particularly tense. Purposefully relax the muscles in those areas.  Download or purchase mobile phone or tablet apps (applications) that can help you stick to your quit plan by providing reminders, tips, and encouragement. There are many free apps, such as QuitGuide from the [employer] Office manager for Disease Control and Prevention). You can find other support for  quitting smoking (smoking cessation) through smokefree.gov and other websites.  How will I feel when I quit smoking? Within the first 24 hours of quitting smoking, you may start to feel some withdrawal symptoms. These symptoms are usually most noticeable 2-3 days after quitting, but they usually do not last beyond 2-3 weeks. Changes or symptoms that you might experience include:  Mood swings.  Restlessness, anxiety, or irritation.  Difficulty concentrating.  Dizziness.  Strong cravings for sugary foods in addition to nicotine.  Mild weight gain.  Constipation.  Nausea.  Coughing or a sore throat.  Changes in how your medicines work in your body.  A depressed mood.  Difficulty sleeping (insomnia).  After the first 2-3 weeks of quitting, you may start to notice more positive results, such as:  Improved sense of smell and taste.  Decreased coughing and sore throat.  Slower heart rate.  Lower blood pressure.  Clearer skin.  The ability to breathe more easily.  Fewer sick days.  Quitting smoking is very challenging for most people. Do not get discouraged if you are not successful the first time. Some people need to make many attempts  to quit before they achieve long-term success. Do your best to stick to your quit plan, and talk with your health care provider if you have any questions or concerns. This information is not intended to replace advice given to you by your health care provider. Make sure you discuss any questions you have with your health care provider. Document Released: 04/04/2001 Document Revised: 12/07/2015 Document Reviewed: 08/25/2014 Elsevier Interactive Patient Education  [employer].

## 2017-12-13 LAB — CBC WITH DIFFERENTIAL/PLATELET
BASOS PCT: 0.5 %
Basophils Absolute: 55 cells/uL (ref 0–200)
Eosinophils Absolute: 109 cells/uL (ref 15–500)
Eosinophils Relative: 1 %
HCT: 48.4 % (ref 38.5–50.0)
Hemoglobin: 16.4 g/dL (ref 13.2–17.1)
Lymphs Abs: 3761 cells/uL (ref 850–3900)
MCH: 29.8 pg (ref 27.0–33.0)
MCHC: 33.9 g/dL (ref 32.0–36.0)
MCV: 87.8 fL (ref 80.0–100.0)
MPV: 11 fL (ref 7.5–12.5)
Monocytes Relative: 7.6 %
NEUTROS PCT: 56.4 %
Neutro Abs: 6148 cells/uL (ref 1500–7800)
PLATELETS: 241 10*3/uL (ref 140–400)
RBC: 5.51 10*6/uL (ref 4.20–5.80)
RDW: 13 % (ref 11.0–15.0)
TOTAL LYMPHOCYTE: 34.5 %
WBC mixed population: 828 cells/uL (ref 200–950)
WBC: 10.9 10*3/uL — ABNORMAL HIGH (ref 3.8–10.8)

## 2017-12-13 LAB — LIPID PANEL
Cholesterol: 125 mg/dL (ref <200)
HDL: 36 mg/dL — ABNORMAL LOW (ref >40)
LDL Cholesterol (Calc): 57 mg/dL (calc)
NON-HDL CHOLESTEROL (CALC): 89 mg/dL (ref <130)
Total CHOL/HDL Ratio: 3.5 (calc) (ref <5.0)
Triglycerides: 272 mg/dL — ABNORMAL HIGH (ref <150)

## 2017-12-13 LAB — COMPLETE METABOLIC PANEL WITH GFR
AG Ratio: 2.1 (calc) (ref 1.0–2.5)
ALT: 29 U/L (ref 9–46)
AST: 21 U/L (ref 10–35)
Albumin: 5 g/dL (ref 3.6–5.1)
Alkaline phosphatase (APISO): 79 U/L (ref 40–115)
BUN: 25 mg/dL (ref 7–25)
CHLORIDE: 99 mmol/L (ref 98–110)
CO2: 28 mmol/L (ref 20–32)
Calcium: 10.2 mg/dL (ref 8.6–10.3)
Creat: 1.11 mg/dL (ref 0.70–1.33)
GFR, EST AFRICAN AMERICAN: 87 mL/min/{1.73_m2} (ref >=60)
GFR, Est Non African American: 75 mL/min/{1.73_m2} (ref >=60)
Globulin: 2.4 g/dL (calc) (ref 1.9–3.7)
Glucose, Bld: 84 mg/dL (ref 65–99)
POTASSIUM: 4.6 mmol/L (ref 3.5–5.3)
Sodium: 137 mmol/L (ref 135–146)
TOTAL PROTEIN: 7.4 g/dL (ref 6.1–8.1)
Total Bilirubin: 0.3 mg/dL (ref 0.2–1.2)

## 2017-12-13 LAB — HEMOGLOBIN A1C
EAG (MMOL/L): 7.3 (calc)
Hgb A1c MFr Bld: 6.2 % of total Hgb — ABNORMAL HIGH (ref <5.7)
MEAN PLASMA GLUCOSE: 131 (calc)

## 2017-12-13 LAB — MAGNESIUM: MAGNESIUM: 2.1 mg/dL (ref 1.5–2.5)

## 2017-12-13 LAB — TSH: TSH: 2.01 m[IU]/L (ref 0.40–4.50)

## 2018-01-17 DIAGNOSIS — K219 Gastro-esophageal reflux disease without esophagitis: Secondary | ICD-10-CM | POA: Diagnosis not present

## 2018-01-17 DIAGNOSIS — Z720 Tobacco use: Secondary | ICD-10-CM | POA: Diagnosis not present

## 2018-01-17 DIAGNOSIS — E119 Type 2 diabetes mellitus without complications: Secondary | ICD-10-CM | POA: Diagnosis not present

## 2018-01-23 ENCOUNTER — Encounter: Payer: Self-pay | Admitting: Internal Medicine

## 2018-01-24 DIAGNOSIS — L723 Sebaceous cyst: Secondary | ICD-10-CM | POA: Diagnosis not present

## 2018-01-24 DIAGNOSIS — K130 Diseases of lips: Secondary | ICD-10-CM | POA: Diagnosis not present

## 2018-02-14 ENCOUNTER — Other Ambulatory Visit: Payer: Self-pay | Admitting: Adult Health

## 2018-04-01 ENCOUNTER — Encounter: Payer: Self-pay | Admitting: Internal Medicine

## 2018-04-01 NOTE — Patient Instructions (Signed)

## 2018-04-01 NOTE — Progress Notes (Signed)
[facility]   [name], M.D.     [name], P.A.-C Liane Comber, [place]                [address] [place], [place] [address] Telephone [phone] Telefax [phone] Annual  Screening/Preventative Visit  & Comprehensive Evaluation & Examination     This very nice 57 y.o. single WM presents for a Screening /Preventative Visit & comprehensive evaluation and management of multiple medical co-morbidities.  Patient has been followed for HTN, HLD, T2_DM and Vitamin D Deficiency.     HTN predates circa 2014. Patient's BP has been controlled at home.  Today's BP is at goal - 122/76. Patient denies any cardiac symptoms as chest pain, palpitations, shortness of breath, dizziness or ankle swelling.     Patient's hyperlipidemia is controlled with diet and medications. Patient denies myalgias or other medication SE's. Last lipids were at goal albeit elevated Trig's: Lab Results  Component Value Date   CHOL 125 12/12/2017   HDL 36 (L) 12/12/2017   LDLCALC 57 12/12/2017   TRIG 272 (H) 12/12/2017   CHOLHDL 3.5 12/12/2017      Patient has hx/o T2_NIDDM predating since 2013 and patient denies reactive hypoglycemic symptoms, visual blurring, diabetic polys or paresthesias. Last A1c was not at goal: Lab Results  Component Value Date   HGBA1C 6.2 (H) 12/12/2017       Patient has been on Thyroid Replacement since 2015. He also has hx/o low Testosterone & has been on replacement with improved stamina & sense of well being.       Finally, patient has history of Vitamin D Deficiency ("33" / 2013)  and last vitamin D was at goal: Lab Results  Component Value Date   VD25OH 63 05/28/2017   Current Outpatient Medications on File Prior to Visit  Medication Sig  . B-D 3CC LUER-LOK SYR 21GX1" 21G X 1" 3 ML MISC USE AS DIRECTED  . cetirizine (ZYRTEC) 10 MG tablet Take 10 mg by mouth daily.  .  Cholecalciferol (VITAMIN D-3) 5000 UNITS TABS Take 10,000 Units by mouth daily.  . cyclobenzaprine (FLEXERIL) 10 MG tablet Take 10 mg by mouth daily as needed for muscle spasms. Takes prn  . dextroamphetamine (DEXEDRINE SPANSULE) 15 MG 24 hr capsule Take 15 mg by mouth daily as needed.  Marland Kitchen levothyroxine (SYNTHROID, LEVOTHROID) 50 MCG tablet TAKE 1 TABLET BY MOUTH ONCE DAILY BEFORE BREAKFAST  . losartan (COZAAR) 50 MG tablet Take 50 mg by mouth daily.   . metFORMIN (GLUCOPHAGE-XR) 500 MG 24 hr tablet TAKE 2 TABLETS TWICE A DAY FOR DIABETES  . ONE TOUCH ULTRA TEST test strip TEST BLOOD SUGAR 3 TIMES A DAY  . ONETOUCH DELICA LANCETS 46T MISC USE TO CHECK SUGAR DAILY  . ranitidine (ZANTAC) 300 MG tablet Twice daily (Patient taking differently: 150 mg. Twice daily)  . testosterone cypionate (DEPOTESTOTERONE CYPIONATE) 200 MG/ML injection USE 2ML INTRAMUSCULARLY EVERY 2 WEEKS AS DIRECTED  . varenicline (CHANTIX CONTINUING MONTH PAK) 1 MG tablet TAKE AS DIRECTED ON PACKAGE  . vitamin C (ASCORBIC ACID) 500 MG tablet Take 2,000 mg by mouth daily.   . Zinc 50 MG TABS Take by mouth.   No current facility-administered medications on file prior to visit.    No Known Allergies Past Medical History:  Diagnosis Date  . DDD (degenerative disc disease)   .  Diabetes mellitus without complication ([place])   . GERD (gastroesophageal reflux disease)   . Hyperlipidemia   . Hypertension   . Hypogonadism male   . Vitamin D deficiency    Health Maintenance  Topic Date Due  . OPHTHALMOLOGY EXAM  01/10/2017  . INFLUENZA VACCINE  11/22/2017  . HEMOGLOBIN A1C  06/14/2018  . FOOT EXAM  04/02/2019  . TETANUS/TDAP  07/18/2023  . COLONOSCOPY  08/28/2023  . PNEUMOCOCCAL POLYSACCHARIDE VACCINE AGE 34-64 HIGH RISK  Completed  . Hepatitis C Screening  Completed  . HIV Screening  Completed   Immunization History  Administered Date(s) Administered  . DTaP 04/04/2001, 04/24/2008  . Influenza Inj Mdck Quad With  Preservative 02/21/2017  . Influenza Whole 01/09/2013  . Influenza,inj,quad, With Preservative 02/17/2016  . PPD Test 10/09/2013, 10/13/2014, 11/17/2015, 02/21/2017  . Pneumococcal Polysaccharide-23 02/21/2017  . Tdap 07/17/2013   Last Colon - 08/27/2013 - Dr Delfin Edis - Recc 10 yr  F/u due May 2025.  Past Surgical History:  Procedure Laterality Date  . 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 CERVICAL FUSION  1991  . ESOPHAGOGASTRODUODENOSCOPY  1987,1992,1993,1995   duodenal ulcer  . LUMBAR DISC SURGERY    . LUMBAR FUSION  2001  . LUMBAR LAMINECTOMY  7/97   Family History  Problem Relation Age of Onset  . Hypertension Mother   . Stroke Mother   . Cancer Mother        ovarian  . Alcohol abuse Father   . Hypertension Father   . Diabetes Maternal Uncle   . Cancer Maternal Grandmother        lung  . Cancer Paternal Grandfather        lung  . Colon cancer Neg Hx   . Stomach cancer Neg Hx    Social History   Socioeconomic History  . Marital status: Single  Occupational History  . Works for [employer]  . Smoking status: Current Every Day Smoker    Packs/day: 0.25    Years: 30.00    Pack years: 7.50    Types: Cigarettes  . Smokeless tobacco: Never Used  Substance and Sexual Activity  . Alcohol use: Yes    Alcohol/week: 4.0 standard drinks    Types: 4 Standard drinks or equivalent per week  . Drug use: No  . Sexual activity: Not on file    ROS Constitutional: Denies fever, chills, weight loss/gain, headaches, insomnia,  night sweats or change in appetite. Does c/o fatigue. Eyes: Denies redness, blurred vision, diplopia, discharge, itchy or watery eyes.  ENT: Denies discharge, congestion, post nasal drip, epistaxis, sore throat, earache, hearing loss, dental pain, Tinnitus, Vertigo, Sinus pain or snoring.  Cardio: Denies chest pain, palpitations, irregular heartbeat, syncope, dyspnea, diaphoresis, orthopnea, PND, claudication or edema Respiratory:  denies cough, dyspnea, DOE, pleurisy, hoarseness, laryngitis or wheezing.  Gastrointestinal: Denies dysphagia, heartburn, reflux, water brash, pain, cramps, nausea, vomiting, bloating, diarrhea, constipation, hematemesis, melena, hematochezia, jaundice or hemorrhoids Genitourinary: Denies dysuria, frequency, urgency, nocturia, hesitancy, discharge, hematuria or flank pain Musculoskeletal: Denies arthralgia, myalgia, stiffness, Jt. Swelling, pain, limp or strain/sprain. Denies Falls. Skin: Denies puritis, rash, hives, warts, acne, eczema or change in skin lesion Neuro: No weakness, tremor, incoordination, spasms, paresthesia or pain Psychiatric: Denies confusion, memory loss or sensory loss. Denies Depression. Endocrine: Denies change in weight, skin, hair change, nocturia, and paresthesia, diabetic polys, visual blurring or hyper / hypo glycemic episodes.  Heme/Lymph: No excessive bleeding, bruising or enlarged lymph  nodes.  Physical Exam  BP 122/76   Pulse 76   Temp (!) 97.3 F (36.3 C)   Resp 16   Ht 5\' 10"  (1.778 m)   Wt 197 lb (89.4 kg)   BMI 28.27 kg/m   General Appearance: Well nourished and well groomed and in no apparent distress.  Eyes: PERRLA, EOMs, conjunctiva no swelling or erythema, normal fundi and vessels. Sinuses: No frontal/maxillary tenderness ENT/Mouth: EACs patent / TMs  nl. Nares clear without erythema, swelling, mucoid exudates. Oral hygiene is good. No erythema, swelling, or exudate. Tongue normal, non-obstructing. Tonsils not swollen or erythematous. Hearing normal.  Neck: Supple, thyroid not palpable. No bruits, nodes or JVD. Respiratory: Respiratory effort normal.  BS equal and clear bilateral without rales, rhonci, wheezing or stridor. Cardio: Heart sounds are normal with regular rate and rhythm and no murmurs, rubs or gallops. Peripheral pulses are normal and equal bilaterally without edema. No aortic or femoral bruits. Chest: symmetric with normal excursions  and percussion.  Abdomen: Soft, with Nl bowel sounds. Nontender, no guarding, rebound, hernias, masses, or organomegaly.  Lymphatics: Non tender without lymphadenopathy.  Genitourinary: No hernias.Testes nl. DRE - prostate nl for age - smooth & firm w/o nodules. Musculoskeletal: Full ROM all peripheral extremities, joint stability, 5/5 strength, and normal gait. Skin: Warm and dry without rashes, lesions, cyanosis, clubbing or  ecchymosis.  Neuro: Cranial nerves intact, reflexes equal bilaterally. Normal muscle tone, no cerebellar symptoms. Sensation intact.  Pysch: Alert and oriented X 3 with normal affect, insight and judgment appropriate.   Assessment and Plan  1. Annual Preventative/Screening Exam   2. Essential hypertension  - EKG 12-Lead - Urinalysis, Routine w reflex microscopic - Microalbumin / creatinine urine ratio - CBC with Differential/Platelet - COMPLETE METABOLIC PANEL WITH GFR - Magnesium - TSH  3. Hyperlipidemia, mixed  - EKG 12-Lead - [place], RETROPERITNL ABD,  LTD - Lipid panel - TSH - Hemoglobin A1c - Insulin, random  4. 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 Vitamin D deficiency  6. Hypothyroidism  - TSH  7. Testosterone deficiency  - Testosterone  8. Attention deficit disorder  9. Screening examination for pulmonary tuberculosis  - TB Skin Test  10. Screening for colorectal cancer  11. Screening for ischemic heart disease  - EKG 12-Lead  12. FHx: heart disease  - EKG 12-Lead - [place], RETROPERITNL ABD,  LTD  13. Smoker  - EKG 12-Lead - [place], RETROPERITNL ABD,  LTD  14. Screening for AAA (aortic abdominal aneurysm)  - [place], RETROPERITNL ABD,  LTD  15. Fatigue  - Vitamin B12 - Testosterone - CBC with Differential/Platelet - TSH -  Iron,Total/Total Iron Binding Cap  16. Medication management  - Urinalysis, Routine w reflex microscopic - Microalbumin / creatinine urine ratio - Testosterone - CBC with Differential/Platelet - COMPLETE METABOLIC PANEL WITH GFR - Magnesium - Lipid panel - TSH - Hemoglobin A1c - Insulin, random - VITAMIN D 25 Hydroxyl        Patient was counseled in prudent diet, weight control to achieve/maintain BMI less than 25, BP monitoring, regular exercise and medications as discussed.  Discussed med effects and SE's. Routine screening labs and tests as requested with regular follow-up as recommended. Over 40 minutes of exam, counseling, chart review and high complex critical decision making  was performed

## 2018-04-02 ENCOUNTER — Ambulatory Visit: Payer: 59 | Admitting: Internal Medicine

## 2018-04-02 ENCOUNTER — Encounter: Payer: Self-pay | Admitting: Internal Medicine

## 2018-04-02 VITALS — BP 122/76 | HR 76 | Temp 97.3°F | Resp 16 | Ht 70.0 in | Wt 197.0 lb

## 2018-04-02 DIAGNOSIS — Z8249 Family history of ischemic heart disease and other diseases of the circulatory system: Secondary | ICD-10-CM

## 2018-04-02 DIAGNOSIS — E039 Hypothyroidism, unspecified: Secondary | ICD-10-CM

## 2018-04-02 DIAGNOSIS — E349 Endocrine disorder, unspecified: Secondary | ICD-10-CM

## 2018-04-02 DIAGNOSIS — I10 Essential (primary) hypertension: Secondary | ICD-10-CM | POA: Diagnosis not present

## 2018-04-02 DIAGNOSIS — E1129 Type 2 diabetes mellitus with other diabetic kidney complication: Secondary | ICD-10-CM

## 2018-04-02 DIAGNOSIS — Z111 Encounter for screening for respiratory tuberculosis: Secondary | ICD-10-CM | POA: Diagnosis not present

## 2018-04-02 DIAGNOSIS — R5383 Other fatigue: Secondary | ICD-10-CM

## 2018-04-02 DIAGNOSIS — F988 Other specified behavioral and emotional disorders with onset usually occurring in childhood and adolescence: Secondary | ICD-10-CM

## 2018-04-02 DIAGNOSIS — Z0000 Encounter for general adult medical examination without abnormal findings: Secondary | ICD-10-CM | POA: Diagnosis not present

## 2018-04-02 DIAGNOSIS — F17200 Nicotine dependence, unspecified, uncomplicated: Secondary | ICD-10-CM

## 2018-04-02 DIAGNOSIS — Z136 Encounter for screening for cardiovascular disorders: Secondary | ICD-10-CM | POA: Diagnosis not present

## 2018-04-02 DIAGNOSIS — E782 Mixed hyperlipidemia: Secondary | ICD-10-CM | POA: Diagnosis not present

## 2018-04-02 DIAGNOSIS — Z0001 Encounter for general adult medical examination with abnormal findings: Secondary | ICD-10-CM

## 2018-04-02 DIAGNOSIS — Z1211 Encounter for screening for malignant neoplasm of colon: Secondary | ICD-10-CM

## 2018-04-02 DIAGNOSIS — Z79899 Other long term (current) drug therapy: Secondary | ICD-10-CM

## 2018-04-02 DIAGNOSIS — Z125 Encounter for screening for malignant neoplasm of prostate: Secondary | ICD-10-CM

## 2018-04-02 DIAGNOSIS — Z1212 Encounter for screening for malignant neoplasm of rectum: Secondary | ICD-10-CM

## 2018-04-02 DIAGNOSIS — E559 Vitamin D deficiency, unspecified: Secondary | ICD-10-CM

## 2018-04-03 LAB — URINALYSIS, ROUTINE W REFLEX MICROSCOPIC
BILIRUBIN URINE: NEGATIVE
Glucose, UA: NEGATIVE
HGB URINE DIPSTICK: NEGATIVE
KETONES UR: NEGATIVE
Leukocytes, UA: NEGATIVE
NITRITE: NEGATIVE
PROTEIN: NEGATIVE
SPECIFIC GRAVITY, URINE: 1.024 (ref 1.001–1.03)
pH: 6.5 (ref 5.0–8.0)

## 2018-04-03 LAB — CBC WITH DIFFERENTIAL/PLATELET
BASOS PCT: 0.5 %
Basophils Absolute: 43 cells/uL (ref 0–200)
Eosinophils Absolute: 129 cells/uL (ref 15–500)
Eosinophils Relative: 1.5 %
HCT: 44.3 % (ref 38.5–50.0)
Hemoglobin: 15.3 g/dL (ref 13.2–17.1)
Lymphs Abs: 3216 cells/uL (ref 850–3900)
MCH: 31 pg (ref 27.0–33.0)
MCHC: 34.5 g/dL (ref 32.0–36.0)
MCV: 89.9 fL (ref 80.0–100.0)
MPV: 11.3 fL (ref 7.5–12.5)
Monocytes Relative: 9 %
Neutro Abs: 4438 cells/uL (ref 1500–7800)
Neutrophils Relative %: 51.6 %
Platelets: 232 10*3/uL (ref 140–400)
RBC: 4.93 10*6/uL (ref 4.20–5.80)
RDW: 13.9 % (ref 11.0–15.0)
TOTAL LYMPHOCYTE: 37.4 %
WBC mixed population: 774 cells/uL (ref 200–950)
WBC: 8.6 10*3/uL (ref 3.8–10.8)

## 2018-04-03 LAB — COMPLETE METABOLIC PANEL WITH GFR
AG Ratio: 2.1 (calc) (ref 1.0–2.5)
ALBUMIN MSPROF: 4.5 g/dL (ref 3.6–5.1)
ALT: 46 U/L (ref 9–46)
AST: 20 U/L (ref 10–35)
Alkaline phosphatase (APISO): 73 U/L (ref 40–115)
BUN: 18 mg/dL (ref 7–25)
CALCIUM: 9.6 mg/dL (ref 8.6–10.3)
CO2: 24 mmol/L (ref 20–32)
Chloride: 104 mmol/L (ref 98–110)
Creat: 0.99 mg/dL (ref 0.70–1.33)
GFR, Est African American: 99 mL/min/{1.73_m2} (ref >=60)
GFR, Est Non African American: 85 mL/min/{1.73_m2} (ref >=60)
Globulin: 2.1 g/dL (calc) (ref 1.9–3.7)
Glucose, Bld: 171 mg/dL — ABNORMAL HIGH (ref 65–99)
Potassium: 4.4 mmol/L (ref 3.5–5.3)
Sodium: 139 mmol/L (ref 135–146)
Total Bilirubin: 0.3 mg/dL (ref 0.2–1.2)
Total Protein: 6.6 g/dL (ref 6.1–8.1)

## 2018-04-03 LAB — MICROALBUMIN / CREATININE URINE RATIO
CREATININE, URINE: 125 mg/dL (ref 20–320)
MICROALB UR: 0.6 mg/dL
Microalb Creat Ratio: 5 mcg/mg creat (ref <30)

## 2018-04-03 LAB — LIPID PANEL
Cholesterol: 128 mg/dL (ref <200)
HDL: 27 mg/dL — ABNORMAL LOW (ref >40)
Non-HDL Cholesterol (Calc): 101 mg/dL (calc) (ref <130)
Total CHOL/HDL Ratio: 4.7 (calc) (ref <5.0)
Triglycerides: 474 mg/dL — ABNORMAL HIGH (ref <150)

## 2018-04-03 LAB — VITAMIN D 25 HYDROXY (VIT D DEFICIENCY, FRACTURES): Vit D, 25-Hydroxy: 66 ng/mL (ref 30–100)

## 2018-04-03 LAB — IRON, TOTAL/TOTAL IRON BINDING CAP
%SAT: 20 % (calc) (ref 20–48)
IRON: 64 ug/dL (ref 50–180)
TIBC: 326 mcg/dL (calc) (ref 250–425)

## 2018-04-03 LAB — VITAMIN B12: Vitamin B-12: 1417 pg/mL — ABNORMAL HIGH (ref 200–1100)

## 2018-04-03 LAB — TESTOSTERONE: TESTOSTERONE: 152 ng/dL — AB (ref 250–827)

## 2018-04-03 LAB — HEMOGLOBIN A1C
Hgb A1c MFr Bld: 6.2 % of total Hgb — ABNORMAL HIGH (ref <5.7)
Mean Plasma Glucose: 131 (calc)
eAG (mmol/L): 7.3 (calc)

## 2018-04-03 LAB — TSH: TSH: 1.53 mIU/L (ref 0.40–4.50)

## 2018-04-03 LAB — INSULIN, RANDOM: Insulin: 112 u[IU]/mL — ABNORMAL HIGH (ref 2.0–19.6)

## 2018-04-03 LAB — MAGNESIUM: Magnesium: 2 mg/dL (ref 1.5–2.5)

## 2018-04-04 LAB — TB SKIN TEST
Induration: 0 mm
TB Skin Test: NEGATIVE

## 2018-04-07 ENCOUNTER — Encounter: Payer: Self-pay | Admitting: Internal Medicine

## 2018-04-08 ENCOUNTER — Other Ambulatory Visit: Payer: Self-pay | Admitting: *Deleted

## 2018-04-08 MED ORDER — ASPIRIN EC 81 MG PO TBEC: 81.0000 mg | DELAYED_RELEASE_TABLET | Freq: Every day | ORAL | 2 refills | Status: AC

## 2018-05-07 ENCOUNTER — Other Ambulatory Visit (INDEPENDENT_AMBULATORY_CARE_PROVIDER_SITE_OTHER): Payer: 59

## 2018-05-07 DIAGNOSIS — Z1211 Encounter for screening for malignant neoplasm of colon: Secondary | ICD-10-CM

## 2018-05-07 LAB — POC HEMOCCULT BLD/STL (HOME/3-CARD/SCREEN)
Card #2 Fecal Occult Blod, POC: NEGATIVE
Card #3 Fecal Occult Blood, POC: NEGATIVE
Fecal Occult Blood, POC: NEGATIVE

## 2018-07-08 NOTE — Progress Notes (Signed)
` FOLLOW UP  Assessment and Plan:   Hypertension Well controlled with current medications  Monitor blood pressure at home; patient to call if consistently greater than 130/80 Continue DASH diet.   Reminder to go to the ER if any CP, SOB, nausea, dizziness, severe HA, changes vision/speech, left arm numbness and tingling and jaw pain.   Cholesterol Currently aggressively controlled with only fish oil supplement Continue low cholesterol diet and exercise.  Check lipid panel.   Diabetes with diabetic chronic kidney disease Continue medication: metformin 2 tabs daily  Continue diet and exercise, progress with weight loss Perform daily foot/skin check, notify office of any concerning changes.  Check A1C  Obesity with co morbidities Long discussion about weight loss, diet, and exercise Recommended diet heavy in fruits and veggies and low in animal meats, cheeses, and dairy products, appropriate calorie intake Discussed ideal weight for height and initial weight goal (175 lb) Patient will work on continue to make sustainable small good choices Motivated to reverse diabetes Will follow up in 3 months  Vitamin D Def At goal at last chec; continue supplementation Check biannual Vit D level  Hypogonadism - continue to monitor, states medication is helping with symptoms of low T.   Hypothyroidism continue medications the same: reminded to take on an empty stomach 30-44mins before food.  check TSH level  Tobacco use Discussed risks associated with tobacco use and advised to reduce or quit Patient is ready to do so and plans to continue chantix Will follow up at the next visit  ADD Continue medications Helps with focus, no AE's; uses rarely The patient was counseled on the addictive nature of the medication and was encouraged to take drug holidays when not needed.    Continue diet and meds as discussed. Further disposition pending results of labs. Discussed med's effects and  SE's.   Over 30 minutes of exam, counseling, chart review, and critical decision making was performed.   Future Appointments  Date Time Provider [place]  10/09/2018  4:00 PM [name], MD [facility] None  05/07/2019  3:00 PM [name], MD [facility] None    ----------------------------------------------------------------------------------------------------------------------  HPI 58 y.o. male  presents for 3 month follow up on hypertension, cholesterol, T2 diabetes, weight, hypothyroid, tobacco use, hypogonadism on testosterone supplement, ADD and vitamin D deficiency.    he currently continues to smoke 4-5 cigarettes a day (down from 0.75 pack daily); discussed risks associated with smoking, patient is ready to quit. He was prescribed chantix and has recently restarted with goal to cut out.   Patient is on an ADD medication, he states that the medication is helping and he denies any adverse reactions. He takes 15 mg dextroamphetamine PRN; he takes very rarely - typically 1-2 occasions a month.   BMI is Body mass index is 28.41 kg/m., he has not been working on diet and exercise. He does plan to start biking again. He has cut out sweet tea, he does drink some soda, does have coffee with sugar.  Wt Readings from Last 3 Encounters:  07/09/18 198 lb (89.8 kg)  04/02/18 197 lb (89.4 kg)  12/12/17 183 lb 9.6 oz (83.3 kg)   His blood pressure has been controlled at home, today their BP is BP: 138/88 Currently off of losartan, has 50 mg at home, stopped due to [place] doc recommendations. Discussed renal protective benefits.   He does workout. He denies chest pain, shortness of breath, dizziness.   He is not on cholesterol medication and  denies myalgias. His cholesterol is not at goal. The cholesterol last visit was:   Lab Results  Component Value Date   CHOL 128 04/02/2018   HDL 27 (L) 04/02/2018   [place]  04/02/2018     Comment:     . LDL cholesterol not calculated.  Triglyceride levels greater than 400 mg/dL invalidate calculated LDL results. . Reference range: <100 . Desirable range <100 mg/dL for primary prevention;   <70 mg/dL for patients with CHD or diabetic patients  with > or = 2 CHD risk factors. Marland Kitchen LDL-C is now calculated using the Martin-Hopkins  calculation, which is a validated novel method providing  better accuracy than the Friedewald equation in the  estimation of LDL-C.  Cresenciano Genre et al. Annamaria Helling. 5784;696(29): 2061-2068  (http://education.[employer].com/faq/FAQ164)    TRIG 474 (H) 04/02/2018   CHOLHDL 4.7 04/02/2018    He has been working on diet and exercise for T2DM (controlled in normal range on metformin -1 tab BID) and denies increased appetite, nausea, paresthesia of the feet, polydipsia, polyuria, visual disturbances and vomiting. Last A1C in the office was:  Lab Results  Component Value Date   HGBA1C 6.2 (H) 04/02/2018   He is on thyroid medication. His medication was not changed last visit.   Lab Results  Component Value Date   TSH 1.53 04/02/2018   Patient is on Vitamin D supplement and at goal at last check:    Lab Results  Component Value Date   VD25OH 66 04/02/2018     He has history of testosterone deficiency and is on testosterone replacement, last shot 3 days ago, He states that the testosterone helps with his energy, libido, muscle mass. Lab Results  Component Value Date   TESTOSTERONE 152 (L) 04/02/2018     Current Medications:  Current Outpatient Medications on File Prior to Visit  Medication Sig  . ferrous sulfate 325 (65 FE) MG tablet Take 325 mg by mouth daily with breakfast.  . aspirin EC 81 MG tablet Take 1 tablet (81 mg total) by mouth daily.  . B-D 3CC LUER-LOK SYR 21GX1" 21G X 1" 3 ML MISC USE AS DIRECTED  . cetirizine (ZYRTEC) 10 MG tablet Take 10 mg by mouth daily.  . Cholecalciferol (VITAMIN D-3) 5000 UNITS TABS Take 10,000 Units by mouth daily.  . cyclobenzaprine (FLEXERIL) 10 MG  tablet Take 10 mg by mouth daily as needed for muscle spasms. Takes prn  . dextroamphetamine (DEXEDRINE SPANSULE) 15 MG 24 hr capsule Take 15 mg by mouth daily as needed.  Marland Kitchen levothyroxine (SYNTHROID, LEVOTHROID) 50 MCG tablet TAKE 1 TABLET BY MOUTH ONCE DAILY BEFORE BREAKFAST  . losartan (COZAAR) 50 MG tablet Take 50 mg by mouth daily.   . metFORMIN (GLUCOPHAGE-XR) 500 MG 24 hr tablet TAKE 2 TABLETS TWICE A DAY FOR DIABETES  . ONE TOUCH ULTRA TEST test strip TEST BLOOD SUGAR 3 TIMES A DAY  . ONETOUCH DELICA LANCETS 52W MISC USE TO CHECK SUGAR DAILY  . testosterone cypionate (DEPOTESTOTERONE CYPIONATE) 200 MG/ML injection USE 2ML INTRAMUSCULARLY EVERY 2 WEEKS AS DIRECTED  . vitamin C (ASCORBIC ACID) 500 MG tablet Take 2,000 mg by mouth daily.   . Zinc 50 MG TABS Take by mouth.   No current facility-administered medications on file prior to visit.      Allergies: No Known Allergies   Medical History:  Past Medical History:  Diagnosis Date  . DDD (degenerative disc disease)   . Diabetes mellitus without complication ([place])   . GERD (  gastroesophageal reflux disease)   . Hyperlipidemia   . Hypertension   . Hypogonadism male   . Vitamin D deficiency    Family history- Reviewed and unchanged Social history- Reviewed and unchanged   Review of Systems:  Review of Systems  Constitutional: Negative for malaise/fatigue and weight loss.  HENT: Negative for hearing loss and tinnitus.   Eyes: Negative for blurred vision and double vision.  Respiratory: Negative for cough, shortness of breath and wheezing.   Cardiovascular: Negative for chest pain, palpitations, orthopnea, claudication and leg swelling.  Gastrointestinal: Negative for abdominal pain, blood in stool, constipation, diarrhea, heartburn, melena, nausea and vomiting.  Genitourinary: Negative.   Musculoskeletal: Negative for joint pain and myalgias.  Skin: Negative for rash.  Neurological: Negative for dizziness, tingling,  sensory change, weakness and headaches.  Endo/Heme/Allergies: Negative for polydipsia.  Psychiatric/Behavioral: Negative.   All other systems reviewed and are negative.    Physical Exam: BP 138/88   Pulse 71   Temp 97.7 F (36.5 C)   Ht 5\' 10"  (1.778 m)   Wt 198 lb (89.8 kg)   SpO2 98%   BMI 28.41 kg/m  Wt Readings from Last 3 Encounters:  07/09/18 198 lb (89.8 kg)  04/02/18 197 lb (89.4 kg)  12/12/17 183 lb 9.6 oz (83.3 kg)   General Appearance: Well nourished, in no apparent distress. Eyes: PERRLA, EOMs, conjunctiva no swelling or erythema Sinuses: No Frontal/maxillary tenderness ENT/Mouth: Ext aud canals clear, TMs without erythema, bulging. No erythema, swelling, or exudate on post pharynx.  Tonsils not swollen or erythematous. Hearing normal.  Neck: Supple, thyroid normal.  Respiratory: Respiratory effort normal, BS equal bilaterally without rales, rhonchi, wheezing or stridor.  Cardio: RRR with no MRGs. Brisk peripheral pulses without edema.  Abdomen: Soft, + BS.  Non tender, no guarding, rebound, hernias, masses. Lymphatics: Non tender without lymphadenopathy.  Musculoskeletal: Full ROM, 5/5 strength, Normal gait Skin: Warm, dry without rashes, lesions, ecchymosis.  Neuro: Cranial nerves intact. No cerebellar symptoms.  Psych: Awake and oriented X 3, normal affect, Insight and Judgment appropriate.    Izora Ribas, NP 3:41 PM [facility]

## 2018-07-09 ENCOUNTER — Other Ambulatory Visit: Payer: Self-pay

## 2018-07-09 ENCOUNTER — Ambulatory Visit (INDEPENDENT_AMBULATORY_CARE_PROVIDER_SITE_OTHER): Payer: 59 | Admitting: Adult Health

## 2018-07-09 ENCOUNTER — Encounter: Payer: Self-pay | Admitting: Adult Health

## 2018-07-09 VITALS — BP 138/88 | HR 71 | Temp 97.7°F | Ht 70.0 in | Wt 198.0 lb

## 2018-07-09 DIAGNOSIS — Z79899 Other long term (current) drug therapy: Secondary | ICD-10-CM

## 2018-07-09 DIAGNOSIS — E1129 Type 2 diabetes mellitus with other diabetic kidney complication: Secondary | ICD-10-CM | POA: Diagnosis not present

## 2018-07-09 DIAGNOSIS — E663 Overweight: Secondary | ICD-10-CM

## 2018-07-09 DIAGNOSIS — I10 Essential (primary) hypertension: Secondary | ICD-10-CM | POA: Diagnosis not present

## 2018-07-09 DIAGNOSIS — E1169 Type 2 diabetes mellitus with other specified complication: Secondary | ICD-10-CM

## 2018-07-09 DIAGNOSIS — E039 Hypothyroidism, unspecified: Secondary | ICD-10-CM | POA: Diagnosis not present

## 2018-07-09 DIAGNOSIS — E349 Endocrine disorder, unspecified: Secondary | ICD-10-CM

## 2018-07-09 DIAGNOSIS — F17200 Nicotine dependence, unspecified, uncomplicated: Secondary | ICD-10-CM

## 2018-07-09 DIAGNOSIS — E785 Hyperlipidemia, unspecified: Secondary | ICD-10-CM

## 2018-07-09 DIAGNOSIS — E559 Vitamin D deficiency, unspecified: Secondary | ICD-10-CM

## 2018-07-09 MED ORDER — VARENICLINE TARTRATE 1 MG PO TABS: ORAL_TABLET | ORAL | 2 refills | Status: DC

## 2018-07-09 NOTE — Patient Instructions (Addendum)
Goals    . HEMOGLOBIN A1C < 5.7    . Weight (lb) < 175 lb (79.4 kg)      Get back on at least 1/2 tab of losartan for kidney protection benefits   Add daily tumeric + bioprene(black pepper fruit) supplement daily, or can do tart cherry supplement daily - natural antiinflammatory that is safe for kidneys and can take daily   If needed pick up famotidine (pepcid) for acid reflux symptoms     Aim for 7+ servings of fruits and vegetables daily  65-80+ fluid ounces of water or unsweet tea for healthy kidneys  Limit to max 1 drink of alcohol per day; avoid smoking/tobacco  Limit animal fats in diet for cholesterol and heart health - choose grass fed whenever available  Avoid highly processed foods, and foods high in saturated/trans fats  Aim for low stress - take time to unwind and care for your mental health  Aim for 150 min of moderate intensity exercise weekly for heart health, and weights twice weekly for bone health  Aim for 7-9 hours of sleep daily        When it comes to diets, agreement about the perfect plan isn't easy to find, even among the experts. Experts at the [place] [employer] developed an idea known as the Healthy Eating Plate. Just imagine a plate divided into logical, healthy portions.  The emphasis is on diet quality:  Load up on vegetables and fruits - one-half of your plate: Aim for color and variety, and remember that potatoes don't count.  Go for whole grains - one-quarter of your plate: Whole wheat, barley, wheat berries, quinoa, oats, brown rice, and foods made with them. If you want pasta, go with whole wheat pasta.  Protein power - one-quarter of your plate: Fish, chicken, beans, and nuts are all healthy, versatile protein sources. Limit red meat.  The diet, however, does go beyond the plate, offering a few other suggestions.  Use healthy plant oils, such as olive, canola, soy, corn, sunflower and peanut. Check the labels, and avoid  partially hydrogenated oil, which have unhealthy trans fats.  If you're thirsty, drink water. Coffee and tea are good in moderation, but skip sugary drinks and limit milk and dairy products to one or two daily servings.  The type of carbohydrate in the diet is more important than the amount. Some sources of carbohydrates, such as vegetables, fruits, whole grains, and beans-are healthier than others.  Finally, stay active.

## 2018-07-10 LAB — CBC WITH DIFFERENTIAL/PLATELET
Absolute Monocytes: 697 cells/uL (ref 200–950)
Basophils Absolute: 52 cells/uL (ref 0–200)
Basophils Relative: 0.6 %
Eosinophils Absolute: 129 cells/uL (ref 15–500)
Eosinophils Relative: 1.5 %
HCT: 48.2 % (ref 38.5–50.0)
HEMOGLOBIN: 16.4 g/dL (ref 13.2–17.1)
Lymphs Abs: 3302 cells/uL (ref 850–3900)
MCH: 30.2 pg (ref 27.0–33.0)
MCHC: 34 g/dL (ref 32.0–36.0)
MCV: 88.8 fL (ref 80.0–100.0)
MPV: 11.2 fL (ref 7.5–12.5)
Monocytes Relative: 8.1 %
Neutro Abs: 4420 cells/uL (ref 1500–7800)
Neutrophils Relative %: 51.4 %
Platelets: 259 10*3/uL (ref 140–400)
RBC: 5.43 10*6/uL (ref 4.20–5.80)
RDW: 13 % (ref 11.0–15.0)
Total Lymphocyte: 38.4 %
WBC: 8.6 10*3/uL (ref 3.8–10.8)

## 2018-07-10 LAB — COMPLETE METABOLIC PANEL WITH GFR
AG Ratio: 2.2 (calc) (ref 1.0–2.5)
ALT: 43 U/L (ref 9–46)
AST: 36 U/L — ABNORMAL HIGH (ref 10–35)
Albumin: 4.6 g/dL (ref 3.6–5.1)
Alkaline phosphatase (APISO): 80 U/L (ref 35–144)
BUN: 20 mg/dL (ref 7–25)
CO2: 25 mmol/L (ref 20–32)
Calcium: 9.7 mg/dL (ref 8.6–10.3)
Chloride: 100 mmol/L (ref 98–110)
Creat: 1 mg/dL (ref 0.70–1.33)
GFR, Est African American: 98 mL/min/{1.73_m2} (ref >=60)
GFR, Est Non African American: 84 mL/min/{1.73_m2} (ref >=60)
Globulin: 2.1 g/dL (calc) (ref 1.9–3.7)
Glucose, Bld: 75 mg/dL (ref 65–99)
Potassium: 4.5 mmol/L (ref 3.5–5.3)
SODIUM: 134 mmol/L — AB (ref 135–146)
Total Bilirubin: 0.5 mg/dL (ref 0.2–1.2)
Total Protein: 6.7 g/dL (ref 6.1–8.1)

## 2018-07-10 LAB — LIPID PANEL
Cholesterol: 113 mg/dL (ref <200)
HDL: 33 mg/dL — ABNORMAL LOW (ref >=40)
LDL Cholesterol (Calc): 54 mg/dL (calc)
Non-HDL Cholesterol (Calc): 80 mg/dL (calc) (ref <130)
Total CHOL/HDL Ratio: 3.4 (calc) (ref <5.0)
Triglycerides: 185 mg/dL — ABNORMAL HIGH (ref <150)

## 2018-07-10 LAB — HEMOGLOBIN A1C
Hgb A1c MFr Bld: 6.1 % of total Hgb — ABNORMAL HIGH (ref <5.7)
MEAN PLASMA GLUCOSE: 128 (calc)
eAG (mmol/L): 7.1 (calc)

## 2018-07-10 LAB — MAGNESIUM: Magnesium: 2.1 mg/dL (ref 1.5–2.5)

## 2018-07-10 LAB — TSH: TSH: 2.39 m[IU]/L (ref 0.40–4.50)

## 2018-10-08 ENCOUNTER — Encounter: Payer: Self-pay | Admitting: Internal Medicine

## 2018-10-08 NOTE — Progress Notes (Signed)
History of Present Illness:      This very nice 58 y.o. single WM presents for 6 month follow up with HTN, HLD, Pre-Diabetes and Vitamin D Deficiency. Patient has ADD with improved focus & Concentration on Dexedrine.      Patient is treated for HTN (2014) & BP has been controlled at home. Today's BP is at goal - 130/76. Patient has had no complaints of any cardiac type chest pain, palpitations, dyspnea / orthopnea / PND, dizziness, claudication, or dependent edema.      Hyperlipidemia is controlled with diet & meds. Patient denies myalgias or other med SE's. Last Lipids were at goal albeit sl elevated Trig's: Lab Results  Component Value Date   CHOL 121 10/09/2018   HDL 30 (L) 10/09/2018   LDLCALC 67 10/09/2018   TRIG 162 (H) 10/09/2018   CHOLHDL 4.0 10/09/2018       Also, the patient has history of T2_NIDDM (2013) and has had no symptoms of reactive hypoglycemia, diabetic polys, paresthesias or visual blurring.  Last A1c was not at goal: Lab Results  Component Value Date   HGBA1C 6.3 (H) 10/09/2018       Patient was initiated on Thyroid Replacement in 2015. Patient also has Testosterone Deficiency & is on parenteral replacement with improved stamina & sense of well being.       Further, the patient also has history of Vitamin D Deficiency("33" / 2013)   and supplements vitamin D without any suspected side-effects. Last vitamin D was at goal: Lab Results  Component Value Date   VD25OH 73 10/09/2018   Current Outpatient Medications on File Prior to Visit  Medication Sig  . aspirin EC 81 MG tablet Take 1 tablet (81 mg total) by mouth daily.  . B-D 3CC LUER-LOK SYR 21GX1" 21G X 1" 3 ML MISC USE AS DIRECTED  . cetirizine (ZYRTEC) 10 MG tablet Take 10 mg by mouth daily.  . Cholecalciferol (VITAMIN D-3) 5000 UNITS TABS Take 10,000 Units by mouth daily.  . cyclobenzaprine (FLEXERIL) 10 MG tablet Take 10 mg by mouth daily as needed for muscle spasms. Takes prn  . dextroamphetamine  (DEXEDRINE SPANSULE) 15 MG 24 hr capsule Take 15 mg by mouth daily as needed.  . ferrous sulfate 325 (65 FE) MG tablet Take 325 mg by mouth daily with breakfast.  . levothyroxine (SYNTHROID, LEVOTHROID) 50 MCG tablet TAKE 1 TABLET BY MOUTH ONCE DAILY BEFORE BREAKFAST  . losartan (COZAAR) 50 MG tablet Take 50 mg by mouth daily.   . metFORMIN (GLUCOPHAGE-XR) 500 MG 24 hr tablet TAKE 2 TABLETS TWICE A DAY FOR DIABETES  . ONE TOUCH ULTRA TEST test strip TEST BLOOD SUGAR 3 TIMES A DAY  . ONETOUCH DELICA LANCETS 16W MISC USE TO CHECK SUGAR DAILY  . testosterone cypionate (DEPOTESTOTERONE CYPIONATE) 200 MG/ML injection USE 2ML INTRAMUSCULARLY EVERY 2 WEEKS AS DIRECTED  . vitamin C (ASCORBIC ACID) 500 MG tablet Take 2,000 mg by mouth daily.   . Zinc 50 MG TABS Take by mouth.  . varenicline (CHANTIX CONTINUING MONTH PAK) 1 MG tablet TAKE AS DIRECTED ON PACKAGE (Patient not taking: Reported on 10/09/2018)   No current facility-administered medications on file prior to visit.    No Known Allergies  PMHx:   Past Medical History:  Diagnosis Date  . DDD (degenerative disc disease)   . Diabetes mellitus without complication ([place])   . GERD (gastroesophageal reflux disease)   . Hyperlipidemia   . Hypertension   . Hypogonadism male   .  Vitamin D deficiency    Immunization History  Administered Date(s) Administered  . DTaP 04/04/2001, 04/24/2008  . Influenza Inj Mdck Quad With Preservative 02/21/2017  . Influenza Whole 01/09/2013  . Influenza,inj,quad, With Preservative 02/17/2016  . PPD Test 10/09/2013, 10/13/2014, 11/17/2015, 02/21/2017, 04/02/2018  . Pneumococcal Polysaccharide-23 02/21/2017  . Tdap 07/17/2013   Past Surgical History:  Procedure Laterality Date  . 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 CERVICAL FUSION  1991  . ESOPHAGOGASTRODUODENOSCOPY  1987,1992,1993,1995   duodenal ulcer  . LUMBAR DISC SURGERY    . LUMBAR FUSION  2001  . LUMBAR LAMINECTOMY  7/97    FHx:    Reviewed /  unchanged  SHx:    Reviewed / unchanged   Systems Review:  Constitutional: Denies fever, chills, wt changes, headaches, insomnia, fatigue, night sweats, change in appetite. Eyes: Denies redness, blurred vision, diplopia, discharge, itchy, watery eyes.  ENT: Denies discharge, congestion, post nasal drip, epistaxis, sore throat, earache, hearing loss, dental pain, tinnitus, vertigo, sinus pain, snoring.  CV: Denies chest pain, palpitations, irregular heartbeat, syncope, dyspnea, diaphoresis, orthopnea, PND, claudication or edema. Respiratory: denies cough, dyspnea, DOE, pleurisy, hoarseness, laryngitis, wheezing.  Gastrointestinal: Denies dysphagia, odynophagia, heartburn, reflux, water brash, abdominal pain or cramps, nausea, vomiting, bloating, diarrhea, constipation, hematemesis, melena, hematochezia  or hemorrhoids. Genitourinary: Denies dysuria, frequency, urgency, nocturia, hesitancy, discharge, hematuria or flank pain. Musculoskeletal: Denies arthralgias, myalgias, stiffness, jt. swelling, pain, limping or strain/sprain.  Skin: Denies pruritus, rash, hives, warts, acne, eczema or change in skin lesion(s). Neuro: No weakness, tremor, incoordination, spasms, paresthesia or pain. Psychiatric: Denies confusion, memory loss or sensory loss. Endo: Denies change in weight, skin or hair change.  Heme/Lymph: No excessive bleeding, bruising or enlarged lymph nodes.  Physical Exam  BP 130/76   Pulse 72   Temp (!) 97.5 F (36.4 C)   Resp 16   Ht 5\' 10"  (1.778 m)   Wt 194 lb 9.6 oz (88.3 kg)   BMI 27.92 kg/m   Appears  well nourished, well groomed  and in no distress.  Eyes: PERRLA, EOMs, conjunctiva no swelling or erythema. Sinuses: No frontal/maxillary tenderness ENT/Mouth: EAC's clear, TM's nl w/o erythema, bulging. Nares clear w/o erythema, swelling, exudates. Oropharynx clear without erythema or exudates. Oral hygiene is good. Tongue normal, non obstructing. Hearing intact.  Neck:  Supple. Thyroid not palpable. Car 2+/2+ without bruits, nodes or JVD. Chest: Respirations nl with BS clear & equal w/o rales, rhonchi, wheezing or stridor.  Cor: Heart sounds normal w/ regular rate and rhythm without sig. murmurs, gallops, clicks or rubs. Peripheral pulses normal and equal  without edema.  Abdomen: Soft & bowel sounds normal. Non-tender w/o guarding, rebound, hernias, masses or organomegaly.  Lymphatics: Unremarkable.  Musculoskeletal: Full ROM all peripheral extremities, joint stability, 5/5 strength and normal gait.  Skin: Warm, dry without exposed rashes, lesions or ecchymosis apparent.  Neuro: Cranial nerves intact, reflexes equal bilaterally. Sensory-motor testing grossly intact. Tendon reflexes grossly intact.  Pysch: Alert & oriented x 3.  Insight and judgement nl & appropriate. No ideations.  Assessment and Plan:  1. Essential hypertension  - Continue medication, monitor blood pressure at home.  - Continue DASH diet.  Reminder to go to the ER if any CP,  SOB, nausea, dizziness, severe HA, changes vision/speech.  - CBC with Differential/Platelet - COMPLETE METABOLIC PANEL WITH GFR - Magnesium - TSH  2. Hyperlipidemia, mixed  - Continue diet/meds, exercise,& lifestyle modifications.  - Continue monitor periodic cholesterol/liver & renal  functions   - Lipid panel - TSH  3. 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  - Monitor appropriate labs.  - Hemoglobin A1c - Insulin, random  4. Vitamin D deficiency  - Continue supplementation.  - VITAMIN D 25 Hydroxyl  5. Testosterone deficiency  - Testosterone  6. Hypothyroidism  - TSH  7. Attention deficit disorder   8. Medication management  - CBC with Differential/Platelet - COMPLETE METABOLIC PANEL WITH GFR - Magnesium - Lipid panel - TSH - Hemoglobin A1c - Insulin, random - VITAMIN D 25 Hydroxyl - Testosterone       Discussed   regular exercise, BP monitoring, weight control to achieve/maintain BMI less than 25 and discussed med and SE's. Recommended labs to assess and monitor clinical status with further disposition pending results of labs.  I discussed the assessment and treatment plan with the patient. The patient was provided an opportunity to ask questions and all were answered. The patient agreed with the plan and demonstrated an understanding of the instructions. I provided over 30 minutes of exam, counseling, chart review and  complex critical decision making.   Kirtland Bouchard, MD

## 2018-10-08 NOTE — Patient Instructions (Signed)

## 2018-10-09 ENCOUNTER — Ambulatory Visit: Payer: 59 | Admitting: Internal Medicine

## 2018-10-09 ENCOUNTER — Other Ambulatory Visit: Payer: Self-pay

## 2018-10-09 ENCOUNTER — Ambulatory Visit: Payer: Self-pay | Admitting: Internal Medicine

## 2018-10-09 VITALS — BP 130/76 | HR 72 | Temp 97.5°F | Resp 16 | Ht 70.0 in | Wt 194.6 lb

## 2018-10-09 DIAGNOSIS — I10 Essential (primary) hypertension: Secondary | ICD-10-CM | POA: Diagnosis not present

## 2018-10-09 DIAGNOSIS — E349 Endocrine disorder, unspecified: Secondary | ICD-10-CM

## 2018-10-09 DIAGNOSIS — E782 Mixed hyperlipidemia: Secondary | ICD-10-CM

## 2018-10-09 DIAGNOSIS — E1129 Type 2 diabetes mellitus with other diabetic kidney complication: Secondary | ICD-10-CM | POA: Diagnosis not present

## 2018-10-09 DIAGNOSIS — F988 Other specified behavioral and emotional disorders with onset usually occurring in childhood and adolescence: Secondary | ICD-10-CM

## 2018-10-09 DIAGNOSIS — Z79899 Other long term (current) drug therapy: Secondary | ICD-10-CM

## 2018-10-09 DIAGNOSIS — E559 Vitamin D deficiency, unspecified: Secondary | ICD-10-CM | POA: Diagnosis not present

## 2018-10-09 DIAGNOSIS — E039 Hypothyroidism, unspecified: Secondary | ICD-10-CM

## 2018-10-10 LAB — COMPLETE METABOLIC PANEL WITH GFR
AG Ratio: 1.9 (calc) (ref 1.0–2.5)
ALT: 43 U/L (ref 9–46)
AST: 20 U/L (ref 10–35)
Albumin: 4.6 g/dL (ref 3.6–5.1)
Alkaline phosphatase (APISO): 68 U/L (ref 35–144)
BUN: 13 mg/dL (ref 7–25)
CO2: 26 mmol/L (ref 20–32)
Calcium: 9.8 mg/dL (ref 8.6–10.3)
Chloride: 101 mmol/L (ref 98–110)
Creat: 0.92 mg/dL (ref 0.70–1.33)
GFR, Est African American: 108 mL/min/{1.73_m2} (ref >=60)
GFR, Est Non African American: 93 mL/min/{1.73_m2} (ref >=60)
Globulin: 2.4 g/dL (calc) (ref 1.9–3.7)
Glucose, Bld: 81 mg/dL (ref 65–99)
Potassium: 4.3 mmol/L (ref 3.5–5.3)
Sodium: 138 mmol/L (ref 135–146)
Total Bilirubin: 0.4 mg/dL (ref 0.2–1.2)
Total Protein: 7 g/dL (ref 6.1–8.1)

## 2018-10-10 LAB — TESTOSTERONE: Testosterone: 582 ng/dL (ref 250–827)

## 2018-10-10 LAB — CBC WITH DIFFERENTIAL/PLATELET
Absolute Monocytes: 580 cells/uL (ref 200–950)
Basophils Absolute: 34 cells/uL (ref 0–200)
Basophils Relative: 0.4 %
Eosinophils Absolute: 126 cells/uL (ref 15–500)
Eosinophils Relative: 1.5 %
HCT: 46.4 % (ref 38.5–50.0)
Hemoglobin: 15.9 g/dL (ref 13.2–17.1)
Lymphs Abs: 3158 cells/uL (ref 850–3900)
MCH: 30.1 pg (ref 27.0–33.0)
MCHC: 34.3 g/dL (ref 32.0–36.0)
MCV: 87.9 fL (ref 80.0–100.0)
MPV: 11.3 fL (ref 7.5–12.5)
Monocytes Relative: 6.9 %
Neutro Abs: 4502 cells/uL (ref 1500–7800)
Neutrophils Relative %: 53.6 %
Platelets: 221 10*3/uL (ref 140–400)
RBC: 5.28 10*6/uL (ref 4.20–5.80)
RDW: 14 % (ref 11.0–15.0)
Total Lymphocyte: 37.6 %
WBC: 8.4 10*3/uL (ref 3.8–10.8)

## 2018-10-10 LAB — LIPID PANEL
Cholesterol: 121 mg/dL (ref <200)
HDL: 30 mg/dL — ABNORMAL LOW (ref >=40)
LDL Cholesterol (Calc): 67 mg/dL (calc)
Non-HDL Cholesterol (Calc): 91 mg/dL (calc) (ref <130)
Total CHOL/HDL Ratio: 4 (calc) (ref <5.0)
Triglycerides: 162 mg/dL — ABNORMAL HIGH (ref <150)

## 2018-10-10 LAB — TSH: TSH: 1.95 mIU/L (ref 0.40–4.50)

## 2018-10-10 LAB — VITAMIN D 25 HYDROXY (VIT D DEFICIENCY, FRACTURES): Vit D, 25-Hydroxy: 73 ng/mL (ref 30–100)

## 2018-10-10 LAB — MAGNESIUM: Magnesium: 1.9 mg/dL (ref 1.5–2.5)

## 2018-10-10 LAB — HEMOGLOBIN A1C
Hgb A1c MFr Bld: 6.3 % of total Hgb — ABNORMAL HIGH (ref <5.7)
Mean Plasma Glucose: 134 (calc)
eAG (mmol/L): 7.4 (calc)

## 2018-10-10 LAB — INSULIN, RANDOM: Insulin: 6.5 u[IU]/mL

## 2019-01-09 NOTE — Progress Notes (Signed)
` FOLLOW UP  Assessment and Plan:   Hypertension Well controlled with current medications  Monitor blood pressure at home; patient to call if consistently greater than 130/80 Continue DASH diet.   Reminder to go to the ER if any CP, SOB, nausea, dizziness, severe HA, changes vision/speech, left arm numbness and tingling and jaw pain.   Cholesterol Currently at LDL goal (<70) by lifestyle, omega 3  Continue low cholesterol diet and exercise.  Check lipid panel.   Diabetes with diabetic chronic kidney disease Continue medication: metformin 3 tabs daily Continue diet and exercise, progress with weight loss, goal to get off diabetic medications Low glycemic index diet reviewed today  Perform daily foot/skin check, notify office of any concerning changes.  Check A1C  Obesity with co morbidities Long discussion about weight loss, diet, and exercise Recommended diet heavy in fruits and veggies and low in animal meats, cheeses, and dairy products, appropriate calorie intake Discussed ideal weight for height and initial weight goal (175 lb)  Patient will work on continue to make sustainable small good choices Motivated to reverse diabetes Will follow up in 3 months  Vitamin D Def At goal at last chec; continue supplementation Defer vit D level to CPE  Hypogonadism - continue to monitor, states medication is helping with symptoms of low T.   Hypothyroidism continue medications the same: reminded to take on an empty stomach 30-70mins before food.  check TSH level  Tobacco use Discussed risks associated with tobacco use and advised to reduce or quit Patient is ready to do so and plans to continue chantix Will follow up at the next visit  ADD Continue medications Helps with focus, no AE's; uses rarely The patient was counseled on the addictive nature of the medication and was encouraged to take drug holidays when not needed.    Continue diet and meds as discussed. Further  disposition pending results of labs. Discussed med's effects and SE's.   Over 30 minutes of exam, counseling, chart review, and critical decision making was performed.   Future Appointments  Date Time Provider [place]  05/07/2019  3:00 PM [name], MD [facility] None    ----------------------------------------------------------------------------------------------------------------------  HPI 58 y.o. male  presents for 3 month follow up on hypertension, cholesterol, T2 diabetes, weight, hypothyroid, tobacco use, hypogonadism on testosterone supplement, ADD and vitamin D deficiency.    he currently continues to smoke 4-5 cigarettes a day (down from 0.75 pack daily); discussed risks associated with smoking, patient is ready to quit. He was prescribed chantix and has recently restarted with goal to cut out.   Patient is on an ADD medication, he states that the medication is helping and he denies any adverse reactions. He takes 15 mg dextroamphetamine PRN; he takes very rarely - typically 1-2 occasions a month.   BMI is Body mass index is 26.83 kg/m., he has been working on diet but not much exercise. He does plan to start biking again. He has cut out sweet tea, he does drink some soda, does have coffee with sugar. He has made small adjustments. Weight goal 175 lb.  Wt Readings from Last 3 Encounters:  01/13/19 187 lb (84.8 kg)  10/09/18 194 lb 9.6 oz (88.3 kg)  07/09/18 198 lb (89.8 kg)   His blood pressure has been controlled at home, today their BP is BP: 132/76 On losartan for renal benefits with T2DM.   He does workout. He denies chest pain, shortness of breath, dizziness.   He is not  on cholesterol medication, only taking fish oil  and denies myalgias. His LDL cholesterol is at goal, trigs on non-fasting labs are mildly elevated. The cholesterol last visit was:   Lab Results  Component Value Date   CHOL 121 10/09/2018   HDL 30 (L) 10/09/2018   LDLCALC 67 10/09/2018    TRIG 162 (H) 10/09/2018   CHOLHDL 4.0 10/09/2018    He has been working on diet and exercise for T2DM (controlled in normal range on metformin -1 tab TID) and denies increased appetite, nausea, paresthesia of the feet, polydipsia, polyuria, visual disturbances and vomiting. Last A1C in the office was:  Lab Results  Component Value Date   HGBA1C 6.3 (H) 10/09/2018   He is on thyroid medication. His medication was not changed last visit.   Lab Results  Component Value Date   TSH 1.95 10/09/2018   Patient is on Vitamin D supplement and at goal at last check:    Lab Results  Component Value Date   VD25OH 73 10/09/2018     He has history of testosterone deficiency and is on testosterone replacement, taking 400 mg every 2 weeks, last shot 01/09/2019. He states that the testosterone helps with his energy, libido, muscle mass. Lab Results  Component Value Date   TESTOSTERONE 582 10/09/2018     Current Medications:  Current Outpatient Medications on File Prior to Visit  Medication Sig  . aspirin EC 81 MG tablet Take 1 tablet (81 mg total) by mouth daily.  . B-D 3CC LUER-LOK SYR 21GX1" 21G X 1" 3 ML MISC USE AS DIRECTED  . cetirizine (ZYRTEC) 10 MG tablet Take 10 mg by mouth daily.  . Cholecalciferol (VITAMIN D-3) 5000 UNITS TABS Take 10,000 Units by mouth daily.  . cyclobenzaprine (FLEXERIL) 10 MG tablet Take 10 mg by mouth daily as needed for muscle spasms. Takes prn  . dextroamphetamine (DEXEDRINE SPANSULE) 15 MG 24 hr capsule Take 15 mg by mouth daily as needed.  . ferrous sulfate 325 (65 FE) MG tablet Take 325 mg by mouth daily with breakfast.  . levothyroxine (SYNTHROID, LEVOTHROID) 50 MCG tablet TAKE 1 TABLET BY MOUTH ONCE DAILY BEFORE BREAKFAST  . losartan (COZAAR) 50 MG tablet Take 50 mg by mouth daily.   . metFORMIN (GLUCOPHAGE-XR) 500 MG 24 hr tablet TAKE 2 TABLETS TWICE A DAY FOR DIABETES  . ONE TOUCH ULTRA TEST test strip TEST BLOOD SUGAR 3 TIMES A DAY  . ONETOUCH DELICA  LANCETS 99991111 MISC USE TO CHECK SUGAR DAILY  . testosterone cypionate (DEPOTESTOTERONE CYPIONATE) 200 MG/ML injection USE 2ML INTRAMUSCULARLY EVERY 2 WEEKS AS DIRECTED  . vitamin C (ASCORBIC ACID) 500 MG tablet Take 2,000 mg by mouth daily.   . Zinc 50 MG TABS Take by mouth.   No current facility-administered medications on file prior to visit.      Allergies: No Known Allergies   Medical History:  Past Medical History:  Diagnosis Date  . DDD (degenerative disc disease)   . Depression 10/09/2013  . Diabetes mellitus without complication ([place])   . GERD (gastroesophageal reflux disease)   . Hyperlipidemia   . Hypertension   . Hypogonadism male   . Vitamin D deficiency    Family history- Reviewed and unchanged Social history- Reviewed and unchanged   Review of Systems:  Review of Systems  Constitutional: Negative for malaise/fatigue and weight loss.  HENT: Negative for hearing loss and tinnitus.   Eyes: Negative for blurred vision and double vision.  Respiratory: Negative for  cough, shortness of breath and wheezing.   Cardiovascular: Negative for chest pain, palpitations, orthopnea, claudication and leg swelling.  Gastrointestinal: Negative for abdominal pain, blood in stool, constipation, diarrhea, heartburn, melena, nausea and vomiting.  Genitourinary: Negative.   Musculoskeletal: Negative for joint pain and myalgias.  Skin: Negative for rash.  Neurological: Negative for dizziness, tingling, sensory change, weakness and headaches.  Endo/Heme/Allergies: Negative for polydipsia.  Psychiatric/Behavioral: Negative.   All other systems reviewed and are negative.    Physical Exam: BP 132/76   Pulse 82   Temp 97.7 F (36.5 C)   Wt 187 lb (84.8 kg)   SpO2 98%   BMI 26.83 kg/m  Wt Readings from Last 3 Encounters:  01/13/19 187 lb (84.8 kg)  10/09/18 194 lb 9.6 oz (88.3 kg)  07/09/18 198 lb (89.8 kg)   General Appearance: Well nourished, in no apparent distress. Eyes:  PERRLA, EOMs, conjunctiva no swelling or erythema Sinuses: No Frontal/maxillary tenderness ENT/Mouth: Ext aud canals clear, TMs without erythema, bulging. No erythema, swelling, or exudate on post pharynx.  Tonsils not swollen or erythematous. Hearing normal.  Neck: Supple, thyroid normal.  Respiratory: Respiratory effort normal, BS equal bilaterally without rales, rhonchi, wheezing or stridor.  Cardio: RRR with no MRGs. Brisk peripheral pulses without edema.  Abdomen: Soft, + BS.  Non tender, no guarding, rebound, hernias, masses. Lymphatics: Non tender without lymphadenopathy.  Musculoskeletal: Full ROM, 5/5 strength, Normal gait Skin: Warm, dry without rashes, lesions, ecchymosis.  Neuro: Cranial nerves intact. No cerebellar symptoms.  Psych: Awake and oriented X 3, normal affect, Insight and Judgment appropriate.    Izora Ribas, NP 3:11 PM [facility]

## 2019-01-13 ENCOUNTER — Other Ambulatory Visit: Payer: Self-pay

## 2019-01-13 ENCOUNTER — Encounter: Payer: Self-pay | Admitting: Adult Health

## 2019-01-13 ENCOUNTER — Ambulatory Visit: Payer: 59 | Admitting: Adult Health

## 2019-01-13 VITALS — BP 132/76 | HR 82 | Temp 97.7°F | Wt 187.0 lb

## 2019-01-13 DIAGNOSIS — E785 Hyperlipidemia, unspecified: Secondary | ICD-10-CM

## 2019-01-13 DIAGNOSIS — E1129 Type 2 diabetes mellitus with other diabetic kidney complication: Secondary | ICD-10-CM

## 2019-01-13 DIAGNOSIS — E039 Hypothyroidism, unspecified: Secondary | ICD-10-CM

## 2019-01-13 DIAGNOSIS — Z79899 Other long term (current) drug therapy: Secondary | ICD-10-CM

## 2019-01-13 DIAGNOSIS — I10 Essential (primary) hypertension: Secondary | ICD-10-CM

## 2019-01-13 DIAGNOSIS — F988 Other specified behavioral and emotional disorders with onset usually occurring in childhood and adolescence: Secondary | ICD-10-CM

## 2019-01-13 DIAGNOSIS — E663 Overweight: Secondary | ICD-10-CM

## 2019-01-13 DIAGNOSIS — E1169 Type 2 diabetes mellitus with other specified complication: Secondary | ICD-10-CM | POA: Diagnosis not present

## 2019-01-13 DIAGNOSIS — F17200 Nicotine dependence, unspecified, uncomplicated: Secondary | ICD-10-CM

## 2019-01-13 DIAGNOSIS — E349 Endocrine disorder, unspecified: Secondary | ICD-10-CM

## 2019-01-13 DIAGNOSIS — E559 Vitamin D deficiency, unspecified: Secondary | ICD-10-CM

## 2019-01-13 MED ORDER — VARENICLINE TARTRATE 1 MG PO TABS: ORAL_TABLET | ORAL | 2 refills | Status: DC

## 2019-01-13 NOTE — Patient Instructions (Addendum)
Goals    . HEMOGLOBIN A1C < 5.7    . LDL CALC < 70    . Weight (lb) < 175 lb (79.4 kg)       Please have your eye doctor forward [place] his exam report      Drink 1/2 your body weight in fluid ounces of water daily; drink a tall glass of water 30 min before meals  Don't eat until you're stuffed- listen to your stomach and eat until you are 80% full   Try eating off of a salad plate; wait 10 min after finishing before going back for seconds  Start by eating the vegetables on your plate; aim for 50% of your meals to be fruits or vegetables  Then eat your protein - lean meats (grass fed if possible), fish, beans, nuts in moderation  Eat your carbs/starch last ONLY if you still are hungry. If you can, stop before finishing it all  Avoid sugar and flour - the closer it looks to it's original form in nature, typically the better it is for you  Splurge in moderation - "assign" days when you get to splurge and have the "bad stuff" - I like to follow a 80% - 20% plan- "good" choices 80 % of the time, "bad" choices in moderation 20% of the time  Simple equation is: Calories out > calories in = weight loss - even if you eat the bad stuff, if you limit portions, you will still lose weight

## 2019-01-14 LAB — CBC WITH DIFFERENTIAL/PLATELET
Absolute Monocytes: 750 cells/uL (ref 200–950)
Basophils Absolute: 48 cells/uL (ref 0–200)
Basophils Relative: 0.4 %
Eosinophils Absolute: 109 cells/uL (ref 15–500)
Eosinophils Relative: 0.9 %
HCT: 44.7 % (ref 38.5–50.0)
Hemoglobin: 15.1 g/dL (ref 13.2–17.1)
Lymphs Abs: 3969 cells/uL — ABNORMAL HIGH (ref 850–3900)
MCH: 30.4 pg (ref 27.0–33.0)
MCHC: 33.8 g/dL (ref 32.0–36.0)
MCV: 90.1 fL (ref 80.0–100.0)
MPV: 11.4 fL (ref 7.5–12.5)
Monocytes Relative: 6.2 %
Neutro Abs: 7224 cells/uL (ref 1500–7800)
Neutrophils Relative %: 59.7 %
Platelets: 231 10*3/uL (ref 140–400)
RBC: 4.96 10*6/uL (ref 4.20–5.80)
RDW: 13.3 % (ref 11.0–15.0)
Total Lymphocyte: 32.8 %
WBC: 12.1 10*3/uL — ABNORMAL HIGH (ref 3.8–10.8)

## 2019-01-14 LAB — COMPLETE METABOLIC PANEL WITH GFR
AG Ratio: 2.1 (calc) (ref 1.0–2.5)
ALT: 42 U/L (ref 9–46)
AST: 22 U/L (ref 10–35)
Albumin: 4.5 g/dL (ref 3.6–5.1)
Alkaline phosphatase (APISO): 63 U/L (ref 35–144)
BUN: 18 mg/dL (ref 7–25)
CO2: 24 mmol/L (ref 20–32)
Calcium: 9.7 mg/dL (ref 8.6–10.3)
Chloride: 105 mmol/L (ref 98–110)
Creat: 0.86 mg/dL (ref 0.70–1.33)
GFR, Est African American: 113 mL/min/{1.73_m2} (ref >=60)
GFR, Est Non African American: 98 mL/min/{1.73_m2} (ref >=60)
Globulin: 2.1 g/dL (calc) (ref 1.9–3.7)
Glucose, Bld: 79 mg/dL (ref 65–99)
Potassium: 4.1 mmol/L (ref 3.5–5.3)
Sodium: 139 mmol/L (ref 135–146)
Total Bilirubin: 0.3 mg/dL (ref 0.2–1.2)
Total Protein: 6.6 g/dL (ref 6.1–8.1)

## 2019-01-14 LAB — LIPID PANEL
Cholesterol: 133 mg/dL (ref <200)
HDL: 33 mg/dL — ABNORMAL LOW (ref >=40)
LDL Cholesterol (Calc): 71 mg/dL (calc)
Non-HDL Cholesterol (Calc): 100 mg/dL (calc) (ref <130)
Total CHOL/HDL Ratio: 4 (calc) (ref <5.0)
Triglycerides: 194 mg/dL — ABNORMAL HIGH (ref <150)

## 2019-01-14 LAB — TSH: TSH: 2.59 mIU/L (ref 0.40–4.50)

## 2019-01-14 LAB — HEMOGLOBIN A1C
Hgb A1c MFr Bld: 6.2 % of total Hgb — ABNORMAL HIGH (ref <5.7)
Mean Plasma Glucose: 131 (calc)
eAG (mmol/L): 7.3 (calc)

## 2019-01-14 LAB — MAGNESIUM: Magnesium: 1.7 mg/dL (ref 1.5–2.5)

## 2019-01-29 LAB — HM DIABETES EYE EXAM

## 2019-02-04 ENCOUNTER — Encounter: Payer: Self-pay | Admitting: Internal Medicine

## 2019-04-03 ENCOUNTER — Other Ambulatory Visit: Payer: Self-pay

## 2019-04-03 ENCOUNTER — Encounter (HOSPITAL_COMMUNITY): Payer: Self-pay

## 2019-04-03 ENCOUNTER — Emergency Department (HOSPITAL_COMMUNITY)
Admission: EM | Admit: 2019-04-03 | Discharge: 2019-04-04 | Disposition: A | Discharge status: Home / Self Care | Payer: 59 | Attending: Emergency Medicine | Admitting: Emergency Medicine

## 2019-04-03 DIAGNOSIS — N132 Hydronephrosis with renal and ureteral calculous obstruction: Secondary | ICD-10-CM | POA: Insufficient documentation

## 2019-04-03 DIAGNOSIS — R1030 Lower abdominal pain, unspecified: Secondary | ICD-10-CM | POA: Diagnosis present

## 2019-04-03 DIAGNOSIS — N201 Calculus of ureter: Secondary | ICD-10-CM

## 2019-04-03 NOTE — ED Triage Notes (Signed)
Pt states that for the past two weeks he has been having L flank pain, today worse, with hematuria and groin pain, hx of stones.

## 2019-04-04 ENCOUNTER — Emergency Department (HOSPITAL_COMMUNITY): Payer: 59

## 2019-04-04 ENCOUNTER — Encounter (HOSPITAL_COMMUNITY): Payer: Self-pay | Admitting: Radiology

## 2019-04-04 LAB — URINALYSIS, ROUTINE W REFLEX MICROSCOPIC
Bacteria, UA: NONE SEEN
Bilirubin Urine: NEGATIVE
Glucose, UA: 50 mg/dL — AB
Ketones, ur: NEGATIVE mg/dL
Leukocytes,Ua: NEGATIVE
Nitrite: NEGATIVE
Protein, ur: 100 mg/dL — AB
RBC / HPF: >50 RBC/hpf — ABNORMAL HIGH (ref 0–5)
Specific Gravity, Urine: 1.025 (ref 1.005–1.030)
pH: 6 (ref 5.0–8.0)

## 2019-04-04 LAB — CBC WITH DIFFERENTIAL/PLATELET
Abs Immature Granulocytes: 0.07 10*3/uL (ref 0.00–0.07)
Basophils Absolute: 0 10*3/uL (ref 0.0–0.1)
Basophils Relative: 0 %
Eosinophils Absolute: 0 10*3/uL (ref 0.0–0.5)
Eosinophils Relative: 0 %
HCT: 47.9 % (ref 39.0–52.0)
Hemoglobin: 15.8 g/dL (ref 13.0–17.0)
Immature Granulocytes: 1 %
Lymphocytes Relative: 7 %
Lymphs Abs: 1 10*3/uL (ref 0.7–4.0)
MCH: 31.1 pg (ref 26.0–34.0)
MCHC: 33 g/dL (ref 30.0–36.0)
MCV: 94.3 fL (ref 80.0–100.0)
Monocytes Absolute: 0.5 10*3/uL (ref 0.1–1.0)
Monocytes Relative: 3 %
Neutro Abs: 12.7 10*3/uL — ABNORMAL HIGH (ref 1.7–7.7)
Neutrophils Relative %: 89 %
Platelets: 227 10*3/uL (ref 150–400)
RBC: 5.08 MIL/uL (ref 4.22–5.81)
RDW: 13.2 % (ref 11.5–15.5)
WBC: 14.3 10*3/uL — ABNORMAL HIGH (ref 4.0–10.5)
nRBC: 0 % (ref 0.0–0.2)

## 2019-04-04 LAB — BASIC METABOLIC PANEL
Anion gap: 10 (ref 5–15)
BUN: 16 mg/dL (ref 6–20)
CO2: 25 mmol/L (ref 22–32)
Calcium: 9.6 mg/dL (ref 8.9–10.3)
Chloride: 101 mmol/L (ref 98–111)
Creatinine, Ser: 1.04 mg/dL (ref 0.61–1.24)
GFR calc Af Amer: >60 mL/min (ref >60)
GFR calc non Af Amer: >60 mL/min (ref >60)
Glucose, Bld: 202 mg/dL — ABNORMAL HIGH (ref 70–99)
Potassium: 4.3 mmol/L (ref 3.5–5.1)
Sodium: 136 mmol/L (ref 135–145)

## 2019-04-04 MED ORDER — TAMSULOSIN HCL 0.4 MG PO CAPS: 0.4000 mg | ORAL_CAPSULE | Freq: Every day | ORAL | 0 refills | Status: AC

## 2019-04-04 MED ORDER — OXYCODONE-ACETAMINOPHEN 5-325 MG PO TABS
1.0000 | ORAL_TABLET | Freq: Once | ORAL | Status: AC
  Administered 2019-04-04: 05:00:00 1 via ORAL
  Filled 2019-04-04: qty 1

## 2019-04-04 MED ORDER — KETOROLAC TROMETHAMINE 30 MG/ML IJ SOLN
30.0000 mg | Freq: Once | INTRAMUSCULAR | Status: AC
  Administered 2019-04-04: 30 mg via INTRAVENOUS
  Filled 2019-04-04: qty 1

## 2019-04-04 MED ORDER — PROMETHAZINE HCL 25 MG PO TABS: 25.0000 mg | ORAL_TABLET | Freq: Four times a day (QID) | ORAL | 0 refills | Status: DC | PRN

## 2019-04-04 MED ORDER — OXYCODONE HCL 5 MG PO TABS: 5.0000 mg | ORAL_TABLET | ORAL | 0 refills | Status: AC | PRN

## 2019-04-04 NOTE — ED Notes (Signed)
Pt given graham crackers and water at this time.

## 2019-04-04 NOTE — ED Notes (Signed)
PA at bedside.

## 2019-04-04 NOTE — Discharge Instructions (Signed)
You were seen in the ER for flank pain.  Urine had blood and red blood cells.  No signs of infection in the urine.  CT confirmed a 7 x 8 mm stone at the very end of your ureter almost at the bladder.  This is likely the cause of your pain.  I spoke to Dr. Junious Silk with urology who would like to give you a trial of passing the stone on your own since it is almost out.  Treatment of your symptoms will include oral hydration, drinking of water until your urine is clear.    For pain and inflammation you can use a combination of ibuprofen and acetaminophen.  Take [phone] mg acetaminophen (tylenol) every 6 hours or 600 mg ibuprofen (advil, motrin) every 6 hours.  You can take these separately or combine them every 6 hours for maximum pain control. Do not exceed 4,000 mg acetaminophen or 2,400 mg ibuprofen in a 24 hour period.  Do not take ibuprofen containing products if you have history of kidney disease, ulcers, GI bleeding, severe acid reflux, or take a blood thinner.  Do not take acetaminophen if you have liver disease.   For break through and/or severe pain despite ibuprofen and acetaminophen regimen, take 5 mg oxycodone every 4 hours.  Oxycodone is a narcotic pain medication that has risk of overdose, death, dependence and abuse. Mild and expected side effects include nausea, stomach upset, drowsiness, constipation. Do not consume alcohol, drive or use heavy machinery while taking this medication. Do not leave unattended around children. Flush any remaining pills that you do not use and do not share.  The emergency department has a strict policy regarding prescription of narcotic medications. We prescribe a short course for acute, new pain or injuries. We are unable to refill this medication in the emergency department for chronic pain or repeatedly.  Refill need to be done by specialist or primary care provider or pain clinic.  Contact your primary care provider or specialist for chronic pain management and  refill on narcotic medications.   Phenergan can be used for nausea, as needed

## 2019-04-04 NOTE — ED Provider Notes (Signed)
[facility] EMERGENCY DEPARTMENT Provider Note   CSN: [id number] Arrival date & time: 04/03/19  2337     History Chief Complaint  Patient presents with  . Flank Pain    Paul Gates is a 58 y.o. male with h/o kidney stones, DM on metformin here for evaluation of flank pain. Onset 1.5-2 weeks ago. Initially pain began on right side, mild like a gnawing pain. Thought it was a pulled muscle.  Over the last 48 hours pain became left sided, severe with radiation into diffuse lower abdomen, testicles left worse than right. Has had nausea with onset of pain and associated hematuria with possible blood clot through urethra.  Feels similar to previous kidney stone he passed several years ago.  Took an old pain medicine pill he had at home around 10 pm and has felt moderate improvement in pain now 4/10.  No fever, vomiting, changes in stools, dysuria, penile discharge.  No concern for STD.  Not sexually active.   HPI     Past Medical History:  Diagnosis Date  . DDD (degenerative disc disease)   . Depression 10/09/2013  . Diabetes mellitus without complication ([place])   . GERD (gastroesophageal reflux disease)   . Hyperlipidemia   . Hypertension   . Hypogonadism male   . Vitamin D deficiency     Patient Active Problem List   Diagnosis Date Noted  . Hypothyroid 05/27/2017  . Overweight (BMI 25.0-29.9) 05/27/2017  . Onychomycosis of toenail 12/29/2015  . COPD (chronic obstructive pulmonary disease) ([place]) 12/06/2015  . History of kidney stones 10/13/2014  . Tobacco use disorder 10/13/2014  . Medication management 10/09/2013  . ADD (attention deficit disorder) 10/09/2013  . Essential hypertension 04/14/2013  . Hyperlipidemia associated with type 2 diabetes mellitus ([place]) 04/14/2013  . 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 Vitamin D deficiency 04/14/2013  . Lumbago 04/14/2013  . DDD (degenerative disc disease), lumbar 04/14/2013  . Testosterone  deficiency 04/14/2013    Past Surgical History:  Procedure Laterality Date  . 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 CERVICAL FUSION  1991  . ESOPHAGOGASTRODUODENOSCOPY  1987,1992,1993,1995   duodenal ulcer  . LUMBAR DISC SURGERY    . LUMBAR FUSION  2001  . LUMBAR LAMINECTOMY  7/97       Family History  Problem Relation Age of Onset  . Hypertension Mother   . Stroke Mother   . Cancer Mother        ovarian  . Alcohol abuse Father   . Hypertension Father   . Clotting disorder Father        antiphospholipid syndrome   . Diabetes Maternal Uncle   . Cancer Maternal Grandmother        lung  . Cancer Paternal Grandfather        lung  . Colon cancer Neg Hx   . Stomach cancer Neg Hx     Social History   Tobacco Use  . Smoking status: Current Every Day Smoker    Packs/day: 0.25    Years: 30.00    Pack years: 7.50    Types: Cigarettes  . Smokeless tobacco: Never Used  Substance Use Topics  . Alcohol use: Yes    Alcohol/week: 4.0 standard drinks    Types: 4 Standard drinks or equivalent per week  . Drug use: No    Home Medications Prior to Admission medications   Medication Sig Start Date End Date Taking? Authorizing Provider  aspirin EC 81 MG tablet Take 1 tablet (81 mg total) by mouth daily. 04/08/18 04/08/19  [name], MD  B-D 3CC LUER-LOK SYR 21GX1" 21G X 1" 3 ML MISC USE AS DIRECTED 07/17/14   [name], MD  cetirizine (ZYRTEC) 10 MG tablet Take 10 mg by mouth daily.    [provider]  Cholecalciferol (VITAMIN D-3) 5000 UNITS TABS Take 10,000 Units by mouth daily.    [provider]  cyclobenzaprine (FLEXERIL) 10 MG tablet Take 10 mg by mouth daily as needed for muscle spasms. Takes prn    [provider]  dextroamphetamine (DEXEDRINE SPANSULE) 15 MG 24 hr capsule Take 15 mg by mouth daily as needed.    [provider]  ferrous sulfate 325 (65 FE) MG tablet Take 325 mg by mouth daily with breakfast.    [provider]  levothyroxine (SYNTHROID, LEVOTHROID) 50 MCG tablet TAKE 1 TABLET BY MOUTH ONCE DAILY BEFORE BREAKFAST 05/11/14   [name], MD  losartan (COZAAR) 50 MG tablet Take 50 mg by mouth daily.     [provider]  metFORMIN (GLUCOPHAGE-XR) 500 MG 24 hr tablet TAKE 2 TABLETS TWICE A DAY FOR DIABETES 02/24/16   [name], MD  ONE [facility] ULTRA TEST test strip TEST BLOOD SUGAR 3 TIMES A DAY 09/28/14   Vicie Mutters, PA-C  ONETOUCH DELICA LANCETS [id number] MISC USE TO CHECK SUGAR DAILY 01/09/14   [name], MD  oxyCODONE (OXY IR/ROXICODONE) 5 MG immediate release tablet Take 1 tablet (5 mg total) by mouth every 4 (four) hours as needed for up to 3 days for severe pain. 04/04/19 04/07/19  Kinnie Feil, PA-C  promethazine (PHENERGAN) 25 MG tablet Take 1 tablet (25 mg total) by mouth every 6 (six) hours as needed for nausea or vomiting. 04/04/19   Kinnie Feil, PA-C  tamsulosin (FLOMAX) 0.4 MG CAPS capsule Take 1 capsule (0.4 mg total) by mouth daily for 15 days. 04/04/19 04/19/19  Kinnie Feil, PA-C  testosterone cypionate (DEPOTESTOTERONE CYPIONATE) 200 MG/ML injection USE 2ML INTRAMUSCULARLY EVERY 2 WEEKS AS DIRECTED 09/28/14   Vicie Mutters, PA-C  varenicline (CHANTIX CONTINUING MONTH PAK) 1 MG tablet TAKE AS DIRECTED ON PACKAGE 01/13/19   Liane Comber, NP  vitamin C (ASCORBIC ACID) 500 MG tablet Take 2,000 mg by mouth daily.     [provider]  Zinc 50 MG TABS Take by mouth.    [provider]    Allergies    Patient has no known allergies.  Review of Systems   Review of Systems  Gastrointestinal: Positive for abdominal pain and nausea.  Genitourinary: Positive for difficulty urinating, flank pain, hematuria and testicular pain.  All other systems reviewed and are negative.   Physical Exam Updated Vital Signs BP (!) 157/80 (BP Location: Left Arm)   Pulse 66   Temp 97.9 F (36.6 C) (Oral)   Resp 16   SpO2 98%    Physical Exam Vitals and nursing note reviewed.  Constitutional:      Appearance: He is well-developed.     Comments: Non toxic.  HENT:     Head: Normocephalic and atraumatic.     Nose: Nose normal.  Eyes:     Conjunctiva/sclera: Conjunctivae normal.  Cardiovascular:     Rate and Rhythm: Normal rate and regular rhythm.     Heart sounds: Normal heart sounds.  Pulmonary:     Effort: Pulmonary effort is normal.     Breath sounds: Normal breath  sounds.  Abdominal:     General: Bowel sounds are normal.     Palpations: Abdomen is soft.     Tenderness: There is abdominal tenderness. There is right CVA tenderness and left CVA tenderness.     Comments: "soreness" with bilateral CVAT L>R.  Diffuse lower abdominal and suprapubic tenderness L>R.  Small non tender easily reducible umbilical hernia.  No G/R/R.  tenderness. Negative Murphy's and McBurney's. Active BS to lower quadrants. No inguinal hernias or tenderness   Genitourinary:    Comments:  External genitalia normal without erythema, edema, tenderness or lesions.  Circumcised male. No groin lymphadenopathy. No meatus discharge. Glans and shaft smooth without tenderness, lesions, masses or deformity. Scrotum without lesions or edema.  Mild tenderness at base of bilateral testes. No epididymis and spermatic cord tenderness or masses, bilaterally. Cremasteric reflex intact. Musculoskeletal:        General: Normal range of motion.     Cervical back: Normal range of motion.  Skin:    General: Skin is warm and dry.     Capillary Refill: Capillary refill takes less than 2 seconds.  Neurological:     Mental Status: He is alert.  Psychiatric:        Behavior: Behavior normal.     ED Results / Procedures / Treatments   Labs (all labs ordered are listed, but only abnormal results are displayed) Labs Reviewed  URINALYSIS, ROUTINE W REFLEX MICROSCOPIC - Abnormal; Notable for the following components:      Result Value   Color, Urine  AMBER (*)    APPearance CLOUDY (*)    Glucose, UA 50 (*)    Hgb urine dipstick LARGE (*)    Protein, ur 100 (*)    RBC / HPF >50 (*)    All other components within normal limits  CBC WITH DIFFERENTIAL/PLATELET - Abnormal; Notable for the following components:   WBC 14.3 (*)    Neutro Abs 12.7 (*)    All other components within normal limits  BASIC METABOLIC PANEL - Abnormal; Notable for the following components:   Glucose, Bld 202 (*)    All other components within normal limits  GC/CHLAMYDIA PROBE AMP ([place]) NOT AT [facility]    EKG None  Radiology CT Renal Stone Study  Result Date: 04/04/2019 CLINICAL DATA:  Bilateral flank pain for 2 weeks. Hematuria. History of kidney stone. EXAM: CT ABDOMEN AND PELVIS WITHOUT CONTRAST TECHNIQUE: Multidetector CT imaging of the abdomen and pelvis was performed following the standard protocol without IV contrast. COMPARISON:  CT 09/23/2013 FINDINGS: Lower chest: Linear atelectasis in the lingula and right middle lobe. No pleural fluid. Hepatobiliary: Mild diffuse hepatic steatosis. No evidence of focal lesion. Gallbladder physiologically distended, no calcified stone. No biliary dilatation. Pancreas: No ductal dilatation or inflammation. Spleen: Normal in size without focal abnormality. Adrenals/Urinary Tract: Normal adrenal glands. Obstructing 7 x 8 mm stone at the left ureterovesicular junction with moderate hydroureteronephrosis. There is moderate left perinephric edema. No additional nonobstructing stones in the left kidney. No right hydronephrosis or hydroureter. No right renal ureteral calculi. Small cortical cyst in the posterior right kidney. Urinary bladder is partially distended. No bladder wall thickening. No bladder stone. Stomach/Bowel: Ingested material within the stomach. No bowel wall thickening, obstruction or inflammation. Normal appendix. There is fecalization of distal small bowel contents. Mild distal colonic diverticulosis. No  diverticulitis. Vascular/Lymphatic: Mild aorta bi-iliac atherosclerosis. No aneurysm. Few prominent periportal nodes are likely reactive. Reproductive: Central prostatic calcifications. Other: No free air, free  fluid, or intra-abdominal fluid collection. Tiny fat containing umbilical hernia. Musculoskeletal: Posterior lumbar fusion L3-L4. Mild adjacent level degenerative disc disease. There are no acute or suspicious osseous abnormalities. IMPRESSION: 1. Obstructing 7 x 8 mm stone at the left ureterovesicular junction with moderate hydronephrosis and perinephric edema. 2. Mild hepatic steatosis. Aortic Atherosclerosis (ICD10-I70.0). Electronically Signed   By: Keith Rake M.D.   On: 04/04/2019 04:19    Procedures Procedures (including critical care time)  Medications Ordered in ED Medications  ketorolac (TORADOL) 30 MG/ML injection 30 mg (has no administration in time range)  oxyCODONE-acetaminophen (PERCOCET/ROXICET) 5-325 MG per tablet 1 tablet (1 tablet Oral Given 04/04/19 [id number])    ED Course  I have reviewed the triage vital signs and the nursing notes.  Pertinent labs & imaging results that were available during my care of the patient were reviewed by me and considered in my medical decision making (see chart for details).  Clinical Course as of Apr 03 542  Fri Apr 04, 2019  0444 WBC(!): 14.3 [CG]  0444 RBC / HPF(!): >50 [CG]  0445 IMPRESSION: 1. Obstructing 7 x 8 mm stone at the left ureterovesicular junction with moderate hydronephrosis and perinephric edema. 2. Mild hepatic steatosis.  Aortic Atherosclerosis (ICD10-I70.0).    CT Renal Laren Everts [CG]    Clinical Course User Index [CG] Kinnie Feil, PA-C   MDM Rules/Calculators/A&P       Clinical picture most consistent with GU process possibly pyelonephritis/UTI but highest on ddx is renal stone.  No CP, SOB, distal neuro pulse deficits and I doubt dissection. No changes in BM to suggest GI process.    Urinalysis obtained at triage shows greater than 50 RBCs but no signs of infection.  Lab work is reassuring, leukocytosis likely reactive.  He has no constitutional symptoms.  Creatinine is normal.  CT renal confirms 7 x 8 mm obstructing left stone at the ureterovesicular junction with moderate hydro and some perinephric edema.  Gonorrhea and chlamydia added to urine given his testicular pain although I have very low suspicion for epididymitis, his testicular discomfort is likely from passing stone.  He is low risk for STDs and his exam is not suggestive of epididymitis, torsion.  Patient's pain adequately controlled here and significantly improved.  Given size of stone I spoke to Dr. Patsy Baltimore who recommends trial of passing stone on his own with oral medicines with close urology follow-up.  Discussed plan with patient who is comfortable with this.  Will DC with Phenergan for nausea as needed, NSAIDs and oxycodone.  Return precautions given.  He is comfortable with this.  Final Clinical Impression(s) / ED Diagnoses Final diagnoses:  Left ureteral calculus    Rx / DC Orders ED Discharge Orders         Ordered    oxyCODONE (OXY IR/ROXICODONE) 5 MG immediate release tablet  Every 4 hours PRN     04/04/19 0534    tamsulosin (FLOMAX) 0.4 MG CAPS capsule  Daily     04/04/19 0534    promethazine (PHENERGAN) 25 MG tablet  Every 6 hours PRN     04/04/19 0534           Kinnie Feil, PA-C 04/04/19 0543    Fatima Blank, MD 04/04/19 [phone]

## 2019-04-04 NOTE — ED Notes (Signed)
Provider aware of d/c BP.

## 2019-04-04 NOTE — ED Notes (Signed)
RN went over d/c paperwork with pt who verbalized understanding. Pt was alert and no distress was noted when ambulated to exit.

## 2019-04-04 NOTE — ED Notes (Signed)
Pt ambulated to bathroom at this time.

## 2019-04-04 NOTE — ED Notes (Signed)
Patient transported to CT 

## 2019-04-05 LAB — URINE CULTURE: Culture: NO GROWTH

## 2019-04-07 LAB — GC/CHLAMYDIA PROBE AMP (~~LOC~~) NOT AT ARMC
Chlamydia: NEGATIVE
Neisseria Gonorrhea: NEGATIVE

## 2019-05-04 ENCOUNTER — Encounter: Payer: Self-pay | Admitting: Internal Medicine

## 2019-05-04 NOTE — Progress Notes (Signed)
Annual  Screening/Preventative Visit  & Comprehensive Evaluation & Examination     This very nice 59 y.o. single WM presents for a Screening /Preventative Visit & comprehensive evaluation and management of multiple medical co-morbidities.  Patient has been followed for HTN, HLD, T2_NIDDM  and Vitamin D Deficiency.     HTN predates since 2014. Patient's BP has been controlled at home.  Today's BP is at goal - 138/80. Patient denies any cardiac symptoms as chest pain, palpitations, shortness of breath, dizziness or ankle swelling.     Patient's hyperlipidemia is controlled with diet and medications. Patient denies myalgias or other medication SE's. Last lipids were at goal except elevated Trig's:  Lab Results  Component Value Date   CHOL 133 01/13/2019   HDL 33 (L) 01/13/2019   LDLCALC 71 01/13/2019   TRIG 194 (H) 01/13/2019   CHOLHDL 4.0 01/13/2019      Patient has hx/o T2_NIDDM circa 2013 and patient denies reactive hypoglycemic symptoms, visual blurring, diabetic polys or paresthesias. Last A1c was not at goal:  Lab Results  Component Value Date   HGBA1C 6.2 (H) 01/13/2019       Patient was dx'd Hypothyroid in 2015 and has been on Thyroid Replacement since then. Patient also has Testosterone Deficiency & is on Depo-Testosterone with improved stamina & sense of well being.      Finally, patient has history of Vitamin D Deficiency ("33"/2013)and last vitamin D was at goal:  Lab Results  Component Value Date   VD25OH 73 10/09/2018   Current Outpatient Medications on File Prior to Visit  Medication Sig  . B-D 3CC LUER-LOK SYR 21GX1" 21G X 1" 3 ML MISC USE AS DIRECTED  . cetirizine (ZYRTEC) 10 MG tablet Take 10 mg by mouth daily.  . Cholecalciferol (VITAMIN D-3) 5000 UNITS TABS Take 10,000 Units by mouth daily.  . cyclobenzaprine (FLEXERIL) 10 MG tablet Take 10 mg by mouth daily as needed for muscle spasms. Takes prn  . dextroamphetamine (DEXEDRINE SPANSULE) 15 MG 24 hr  capsule Take 15 mg by mouth daily as needed.  . ferrous sulfate 325 (65 FE) MG tablet Take 325 mg by mouth daily with breakfast.  . levothyroxine (SYNTHROID, LEVOTHROID) 50 MCG tablet TAKE 1 TABLET BY MOUTH ONCE DAILY BEFORE BREAKFAST  . losartan (COZAAR) 50 MG tablet Take 50 mg by mouth daily.   . metFORMIN (GLUCOPHAGE-XR) 500 MG 24 hr tablet TAKE 2 TABLETS TWICE A DAY FOR DIABETES  . ONE TOUCH ULTRA TEST test strip TEST BLOOD SUGAR 3 TIMES A DAY  . ONETOUCH DELICA LANCETS [id number] MISC USE TO CHECK SUGAR DAILY  . promethazine (PHENERGAN) 25 MG tablet Take 1 tablet (25 mg total) by mouth every 6 (six) hours as needed for nausea or vomiting.  Marland Kitchen testosterone cypionate (DEPOTESTOTERONE CYPIONATE) 200 MG/ML injection USE 2ML INTRAMUSCULARLY EVERY 2 WEEKS AS DIRECTED  . varenicline (CHANTIX CONTINUING MONTH PAK) 1 MG tablet TAKE AS DIRECTED ON PACKAGE  . vitamin C (ASCORBIC ACID) 500 MG tablet Take 2,000 mg by mouth daily.   . Zinc 50 MG TABS Take by mouth.   No current facility-administered medications on file prior to visit.   No Known Allergies Past Medical History:  Diagnosis Date  . DDD (degenerative disc disease)   . Depression 10/09/2013  . Diabetes mellitus without complication ([place])   . GERD (gastroesophageal reflux disease)   . Hyperlipidemia   . Hypertension   . Hypogonadism male   . Vitamin D deficiency  Health Maintenance  Topic Date Due  . INFLUENZA VACCINE  11/23/2018  . HEMOGLOBIN A1C  07/13/2019  . OPHTHALMOLOGY EXAM  01/29/2020  . FOOT EXAM  05/03/2020  . TETANUS/TDAP  07/18/2023  . COLONOSCOPY  08/28/2023  . PNEUMOCOCCAL POLYSACCHARIDE VACCINE AGE 81-64 HIGH RISK  Completed  . Hepatitis C Screening  Completed  . HIV Screening  Completed   Immunization History  Administered Date(s) Administered  . DTaP 04/04/2001, 04/24/2008  . Influenza Inj Mdck Quad With Preservative 02/21/2017  . Influenza Whole 01/09/2013  . Influenza,inj,quad, With Preservative 02/17/2016  .  PPD Test 10/09/2013, 10/13/2014, 11/17/2015, 02/21/2017, 04/02/2018, 05/07/2019  . Pneumococcal Polysaccharide-23 02/21/2017  . Tdap 07/17/2013   Last Colon - 08/27/2013 - Dr Delfin Edis - Recc 10 yr  F/u due May 2025.  Past Surgical History:  Procedure Laterality Date  . 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 CERVICAL FUSION  1991  . ESOPHAGOGASTRODUODENOSCOPY  1987,1992,1993,1995   duodenal ulcer  . LUMBAR DISC SURGERY    . LUMBAR FUSION  2001  . LUMBAR LAMINECTOMY  7/97   Family History  Problem Relation Age of Onset  . Hypertension Mother   . Stroke Mother   . Cancer Mother        ovarian  . Alcohol abuse Father   . Hypertension Father   . Clotting disorder Father        antiphospholipid syndrome   . Diabetes Maternal Uncle   . Cancer Maternal Grandmother        lung  . Cancer Paternal Grandfather        lung  . Colon cancer Neg Hx   . Stomach cancer Neg Hx    Social History   Socioeconomic History  . Marital status: Single  Occupational History  . Not on file  Tobacco Use  . Smoking status: Current Every Day Smoker    Packs/day: 0.25    Years: 30.00    Pack years: 7.50    Types: Cigarettes  . Smokeless tobacco: Never Used  Substance and Sexual Activity  . Alcohol use: Yes    Alcohol/week: 4.0 standard drinks    Types: 4 Standard drinks or equivalent per week  . Drug use: No  . Sexual activity: Not on file     ROS Constitutional: Denies fever, chills, weight loss/gain, headaches, insomnia,  night sweats or change in appetite. Does c/o fatigue. Eyes: Denies redness, blurred vision, diplopia, discharge, itchy or watery eyes.  ENT: Denies discharge, congestion, post nasal drip, epistaxis, sore throat, earache, hearing loss, dental pain, Tinnitus, Vertigo, Sinus pain or snoring.  Cardio: Denies chest pain, palpitations, irregular heartbeat, syncope, dyspnea, diaphoresis, orthopnea, PND, claudication or edema Respiratory: denies cough, dyspnea, DOE, pleurisy,  hoarseness, laryngitis or wheezing.  Gastrointestinal: Denies dysphagia, heartburn, reflux, water brash, pain, cramps, nausea, vomiting, bloating, diarrhea, constipation, hematemesis, melena, hematochezia, jaundice or hemorrhoids Genitourinary: Denies dysuria, frequency, urgency, nocturia, hesitancy, discharge, hematuria or flank pain Musculoskeletal: Denies arthralgia, myalgia, stiffness, Jt. Swelling, pain, limp or strain/sprain. Denies Falls. Skin: Denies puritis, rash, hives, warts, acne, eczema or change in skin lesion Neuro: No weakness, tremor, incoordination, spasms, paresthesia or pain Psychiatric: Denies confusion, memory loss or sensory loss. Denies Depression. Endocrine: Denies change in weight, skin, hair change, nocturia, and paresthesia, diabetic polys, visual blurring or hyper / hypo glycemic episodes.  Heme/Lymph: No excessive bleeding, bruising or enlarged lymph nodes.  Physical Exam  BP 138/80   Pulse 72   Temp (!) 97.2 F (36.2  C)   Resp 16   Ht 5\' 10"  (1.778 m)   Wt 189 lb 6.4 oz (85.9 kg)   BMI 27.18 kg/m   General Appearance: Well nourished and well groomed and in no apparent distress.  Eyes: PERRLA, EOMs, conjunctiva no swelling or erythema, normal fundi and vessels. Sinuses: No frontal/maxillary tenderness ENT/Mouth: EACs patent / TMs  nl. Nares clear without erythema, swelling, mucoid exudates. Oral hygiene is good. No erythema, swelling, or exudate. Tongue normal, non-obstructing. Tonsils not swollen or erythematous. Hearing normal.  Neck: Supple, thyroid not palpable. No bruits, nodes or JVD. Respiratory: Respiratory effort normal.  BS equal and clear bilateral without rales, rhonci, wheezing or stridor. Cardio: Heart sounds are normal with regular rate and rhythm and no murmurs, rubs or gallops. Peripheral pulses are normal and equal bilaterally without edema. No aortic or femoral bruits. Chest: symmetric with normal excursions and percussion.  Abdomen:  Soft, with Nl bowel sounds. Nontender, no guarding, rebound, hernias, masses, or organomegaly.  Lymphatics: Non tender without lymphadenopathy.  Musculoskeletal: Full ROM all peripheral extremities, joint stability, 5/5 strength, and normal gait. Skin: Warm and dry without rashes, lesions, cyanosis, clubbing or  ecchymosis.  Neuro: Cranial nerves intact, reflexes equal bilaterally. Normal muscle tone, no cerebellar symptoms. Sensation intact to touch, vibratory and Monofilament to the toes bilaterally.  Pysch: Alert and oriented X 3 with normal affect, insight and judgment appropriate.   Assessment and Plan  1. Annual Preventative/Screening Exam   2. Essential hypertension  - EKG 12-Lead - [place], retroperitnl abd,  ltd - Urinalysis, Routine w reflex microscopic - Microalbumin / Creatinine Urine Ratio - CBC with Diff - COMPLETE METABOLIC PANEL WITH GFR - Magnesium - TSH  3. Hyperlipidemia associated with type 2 diabetes mellitus (HCC)  - EKG 12-Lead - [place], retroperitnl abd,  ltd - Lipid Profile - TSH  4. 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 Vitamin D deficiency  - Vitamin D (25 hydroxy)  6. Testosterone deficiency  - Testosterone, Total  7. Hypothyroidism, unspecified type   8. Screening for colorectal cancer  - POC Hemoccult Bld/Stl   9. Screening examination for pulmonary tuberculosis  - TB Skin Test  10. Prostate cancer screening  - PSA  11. Screening for ischemic heart disease  - EKG 12-Lead  12. FHx: heart disease  - EKG 12-Lead - [place], retroperitnl abd,  ltd  13. Smoker  - EKG 12-Lead - [place], retroperitnl abd,  ltd  14. Screening for AAA (aortic abdominal aneurysm)  - [place], retroperitnl abd,  ltd  15. Fatigue,  unspecified type  - Iron,Total/Total Iron Binding Cap - Vitamin B12 - CBC with Diff - TSH  16. Medication management  - Urinalysis, Routine w reflex microscopic - Microalbumin / Creatinine Urine Ratio - Testosterone, Total - CBC with Diff - COMPLETE METABOLIC PANEL WITH GFR - Magnesium - Lipid Profile - TSH - Hemoglobin A1c ([facility]) - Insulin, random - Vitamin D (25 hydroxy)        Patient was counseled in prudent diet, weight control to achieve/maintain BMI less than 25, BP monitoring, regular exercise and medications as discussed.  Discussed med effects and SE's. Routine screening labs and tests as requested with regular follow-up as recommended. Over 40 minutes of exam, counseling,  chart review and high complex critical decision making was performed   Kirtland Bouchard, MD

## 2019-05-04 NOTE — Patient Instructions (Signed)

## 2019-05-07 ENCOUNTER — Other Ambulatory Visit: Payer: Self-pay

## 2019-05-07 ENCOUNTER — Ambulatory Visit: Payer: 59 | Admitting: Internal Medicine

## 2019-05-07 VITALS — BP 138/80 | HR 72 | Temp 97.2°F | Resp 16 | Ht 70.0 in | Wt 189.4 lb

## 2019-05-07 DIAGNOSIS — Z1211 Encounter for screening for malignant neoplasm of colon: Secondary | ICD-10-CM

## 2019-05-07 DIAGNOSIS — R5383 Other fatigue: Secondary | ICD-10-CM

## 2019-05-07 DIAGNOSIS — Z136 Encounter for screening for cardiovascular disorders: Secondary | ICD-10-CM | POA: Diagnosis not present

## 2019-05-07 DIAGNOSIS — Z0000 Encounter for general adult medical examination without abnormal findings: Secondary | ICD-10-CM

## 2019-05-07 DIAGNOSIS — E039 Hypothyroidism, unspecified: Secondary | ICD-10-CM

## 2019-05-07 DIAGNOSIS — Z111 Encounter for screening for respiratory tuberculosis: Secondary | ICD-10-CM | POA: Diagnosis not present

## 2019-05-07 DIAGNOSIS — Z8249 Family history of ischemic heart disease and other diseases of the circulatory system: Secondary | ICD-10-CM

## 2019-05-07 DIAGNOSIS — E559 Vitamin D deficiency, unspecified: Secondary | ICD-10-CM

## 2019-05-07 DIAGNOSIS — Z87442 Personal history of urinary calculi: Secondary | ICD-10-CM

## 2019-05-07 DIAGNOSIS — Z79899 Other long term (current) drug therapy: Secondary | ICD-10-CM

## 2019-05-07 DIAGNOSIS — Z0001 Encounter for general adult medical examination with abnormal findings: Secondary | ICD-10-CM

## 2019-05-07 DIAGNOSIS — E1169 Type 2 diabetes mellitus with other specified complication: Secondary | ICD-10-CM

## 2019-05-07 DIAGNOSIS — E1129 Type 2 diabetes mellitus with other diabetic kidney complication: Secondary | ICD-10-CM

## 2019-05-07 DIAGNOSIS — I10 Essential (primary) hypertension: Secondary | ICD-10-CM

## 2019-05-07 DIAGNOSIS — Z125 Encounter for screening for malignant neoplasm of prostate: Secondary | ICD-10-CM

## 2019-05-07 DIAGNOSIS — E349 Endocrine disorder, unspecified: Secondary | ICD-10-CM

## 2019-05-07 DIAGNOSIS — Z1212 Encounter for screening for malignant neoplasm of rectum: Secondary | ICD-10-CM

## 2019-05-07 DIAGNOSIS — F17200 Nicotine dependence, unspecified, uncomplicated: Secondary | ICD-10-CM

## 2019-05-08 LAB — INSULIN, RANDOM: Insulin: 33 u[IU]/mL — ABNORMAL HIGH

## 2019-05-08 LAB — COMPLETE METABOLIC PANEL WITH GFR
AG Ratio: 2.2 (calc) (ref 1.0–2.5)
ALT: 40 U/L (ref 9–46)
AST: 23 U/L (ref 10–35)
Albumin: 4.7 g/dL (ref 3.6–5.1)
Alkaline phosphatase (APISO): 66 U/L (ref 35–144)
BUN: 19 mg/dL (ref 7–25)
CO2: 29 mmol/L (ref 20–32)
Calcium: 10.2 mg/dL (ref 8.6–10.3)
Chloride: 106 mmol/L (ref 98–110)
Creat: 1.01 mg/dL (ref 0.70–1.33)
GFR, Est African American: 96 mL/min/{1.73_m2} (ref >=60)
GFR, Est Non African American: 83 mL/min/{1.73_m2} (ref >=60)
Globulin: 2.1 g/dL (calc) (ref 1.9–3.7)
Glucose, Bld: 78 mg/dL (ref 65–99)
Potassium: 4.9 mmol/L (ref 3.5–5.3)
Sodium: 142 mmol/L (ref 135–146)
Total Bilirubin: 0.3 mg/dL (ref 0.2–1.2)
Total Protein: 6.8 g/dL (ref 6.1–8.1)

## 2019-05-08 LAB — CBC WITH DIFFERENTIAL/PLATELET
Absolute Monocytes: 750 cells/uL (ref 200–950)
Basophils Absolute: 56 cells/uL (ref 0–200)
Basophils Relative: 0.5 %
Eosinophils Absolute: 112 cells/uL (ref 15–500)
Eosinophils Relative: 1 %
HCT: 46.6 % (ref 38.5–50.0)
Hemoglobin: 16.2 g/dL (ref 13.2–17.1)
Lymphs Abs: 3360 cells/uL (ref 850–3900)
MCH: 31.3 pg (ref 27.0–33.0)
MCHC: 34.8 g/dL (ref 32.0–36.0)
MCV: 90 fL (ref 80.0–100.0)
MPV: 11.3 fL (ref 7.5–12.5)
Monocytes Relative: 6.7 %
Neutro Abs: 6922 cells/uL (ref 1500–7800)
Neutrophils Relative %: 61.8 %
Platelets: 218 10*3/uL (ref 140–400)
RBC: 5.18 10*6/uL (ref 4.20–5.80)
RDW: 12.5 % (ref 11.0–15.0)
Total Lymphocyte: 30 %
WBC: 11.2 10*3/uL — ABNORMAL HIGH (ref 3.8–10.8)

## 2019-05-08 LAB — HEMOGLOBIN A1C
Hgb A1c MFr Bld: 6.3 % of total Hgb — ABNORMAL HIGH (ref <5.7)
Mean Plasma Glucose: 134 (calc)
eAG (mmol/L): 7.4 (calc)

## 2019-05-08 LAB — LIPID PANEL
Cholesterol: 132 mg/dL (ref <200)
HDL: 34 mg/dL — ABNORMAL LOW (ref >=40)
LDL Cholesterol (Calc): 72 mg/dL (calc)
Non-HDL Cholesterol (Calc): 98 mg/dL (calc) (ref <130)
Total CHOL/HDL Ratio: 3.9 (calc) (ref <5.0)
Triglycerides: 193 mg/dL — ABNORMAL HIGH (ref <150)

## 2019-05-08 LAB — URINALYSIS, ROUTINE W REFLEX MICROSCOPIC
Bilirubin Urine: NEGATIVE
Glucose, UA: NEGATIVE
Hgb urine dipstick: NEGATIVE
Ketones, ur: NEGATIVE
Leukocytes,Ua: NEGATIVE
Nitrite: NEGATIVE
Protein, ur: NEGATIVE
Specific Gravity, Urine: 1.022 (ref 1.001–1.03)
pH: 6.5 (ref 5.0–8.0)

## 2019-05-08 LAB — MICROALBUMIN / CREATININE URINE RATIO
Creatinine, Urine: 116 mg/dL (ref 20–320)
Microalb Creat Ratio: 9 mcg/mg creat (ref <30)
Microalb, Ur: 1 mg/dL

## 2019-05-08 LAB — VITAMIN D 25 HYDROXY (VIT D DEFICIENCY, FRACTURES): Vit D, 25-Hydroxy: 74 ng/mL (ref 30–100)

## 2019-05-08 LAB — PSA: PSA: 0.5 ng/mL (ref <=4.0)

## 2019-05-08 LAB — VITAMIN B12: Vitamin B-12: 980 pg/mL (ref 200–1100)

## 2019-05-08 LAB — IRON, TOTAL/TOTAL IRON BINDING CAP
%SAT: 32 % (calc) (ref 20–48)
Iron: 101 ug/dL (ref 50–180)
TIBC: 314 mcg/dL (calc) (ref 250–425)

## 2019-05-08 LAB — MAGNESIUM: Magnesium: 2.2 mg/dL (ref 1.5–2.5)

## 2019-05-08 LAB — TSH: TSH: 2.36 mIU/L (ref 0.40–4.50)

## 2019-05-08 LAB — TESTOSTERONE: Testosterone: 66 ng/dL — ABNORMAL LOW (ref 250–827)

## 2019-07-01 ENCOUNTER — Other Ambulatory Visit: Payer: Self-pay | Admitting: Internal Medicine

## 2019-07-01 ENCOUNTER — Telehealth: Payer: Self-pay | Admitting: *Deleted

## 2019-07-01 MED ORDER — TESTOSTERONE CYPIONATE 200 MG/ML IM SOLN: INTRAMUSCULAR | 2 refills | Status: DC

## 2019-07-01 NOTE — Telephone Encounter (Signed)
Last OV note and RX for Testosterone faxed to the [facility].

## 2019-07-29 ENCOUNTER — Other Ambulatory Visit: Payer: Self-pay | Admitting: Internal Medicine

## 2019-07-29 MED ORDER — TESTOSTERONE CYPIONATE 200 MG/ML IM SOLN: INTRAMUSCULAR | 2 refills | Status: DC

## 2019-07-30 ENCOUNTER — Telehealth: Payer: Self-pay | Admitting: *Deleted

## 2019-07-30 NOTE — Telephone Encounter (Signed)
An RX for Testosterone Cypionate 200 mg/ml 2 ml IM every 2 weeks was faxed to the [facility], at the patient's request, to verify his dosage.

## 2019-08-04 ENCOUNTER — Telehealth: Payer: Self-pay | Admitting: *Deleted

## 2019-08-04 DIAGNOSIS — N182 Chronic kidney disease, stage 2 (mild): Secondary | ICD-10-CM | POA: Insufficient documentation

## 2019-08-04 DIAGNOSIS — E1122 Type 2 diabetes mellitus with diabetic chronic kidney disease: Secondary | ICD-10-CM | POA: Insufficient documentation

## 2019-08-04 NOTE — Telephone Encounter (Signed)
Recent lab results faxed to the [facility] in [place], [place].

## 2019-08-04 NOTE — Progress Notes (Signed)
` FOLLOW UP  Assessment and Plan:   Hypertension Well controlled with current medications  Monitor blood pressure at home; patient to call if consistently greater than 130/80 Continue DASH diet.   Reminder to go to the ER if any CP, SOB, nausea, dizziness, severe HA, changes vision/speech, left arm numbness and tingling and jaw pain.   Hyperlipidemia associated with T2DM ([place]) Currently at LDL goal (<70) by lifestyle, omega 3  Discussed consider low dose statin for risk reduction  Continue low cholesterol diet and exercise.  Check lipid panel.   Diabetes with diabetic chronic kidney disease ([place]) Continue medication: metformin 3 tabs daily Continue diet and exercise, progress with weight loss, goal to get off diabetic medications Low glycemic index diet reviewed today  Perform daily foot/skin check, notify office of any concerning changes.  Check A1C  CKD II associated with T2DM ([facility]) Increase fluids, avoid NSAIDS, monitor sugars, will monitor  Obesity with co morbidities Long discussion about weight loss, diet, and exercise Recommended diet heavy in fruits and veggies and low in animal meats, cheeses, and dairy products, appropriate calorie intake Discussed ideal weight for height and initial weight goal (175 lb)  Patient will work on continue to make sustainable small good choices Motivated to reverse diabetes Will follow up in 3 months  Vitamin D Def At goal at last chec; continue supplementation Defer vit D level to CPE  Hypogonadism - continue to monitor, states medication is helping with symptoms of low T.   Hypothyroidism continue medications the same: reminded to take on an empty stomach 30-37mins before food.  check TSH level  Tobacco use Discussed risks associated with tobacco use and advised to reduce or quit Patient is not ready to do so but plans to do chantix  Will follow up at the next visit  ADD Continue medications Helps with focus, no AE's; uses  rarely The patient was counseled on the addictive nature of the medication and was encouraged to take drug holidays when not needed.    Continue diet and meds as discussed. Further disposition pending results of labs. Discussed med's effects and SE's.   Over 30 minutes of exam, counseling, chart review, and critical decision making was performed.   Future Appointments  Date Time Provider [place]  11/13/2019  3:30 PM [name], MD [facility] None  05/26/2020  3:00 PM [name], MD [facility] None    ----------------------------------------------------------------------------------------------------------------------  HPI 59 y.o. male  presents for 3 month follow up on hypertension, cholesterol, T2 diabetes, weight, hypothyroid, tobacco use, hypogonadism on testosterone supplement, ADD and vitamin D deficiency.    he currently continues to smoke 1/2 pack (down from 1 pack average since 1980); discussed risks associated with smoking, patient is not ready to quit.  He was prescribed chantix knows he needs to be ready to quit, plans to sometime this year but not ready to set a date.  He has COPD per imaging, CXR. Denies coughing, dyspnea, wheezing.  Patient is on an ADD medication, he states that the medication is helping and he denies any adverse reactions. He takes 15 mg dextroamphetamine PRN; he takes very rarely - typically 1-2 occasions a month.   BMI is Body mass index is 26.26 kg/m., he has been working on diet but not much exercise. He does plan to start biking again. He has cut out sweet tea, he does drink some soda, does have coffee with sugar. He has made small adjustments. Weight goal 175 lb.  Wt Readings from  Last 3 Encounters:  08/06/19 183 lb (83 kg)  05/07/19 189 lb 6.4 oz (85.9 kg)  01/13/19 187 lb (84.8 kg)   His blood pressure has been controlled at home, today their BP is BP: 110/68  On losartan for renal benefits with T2DM.   He does workout. He  denies chest pain, shortness of breath, dizziness.   He is not on cholesterol medication, only taking fish oil  and denies myalgias. His LDL cholesterol is at goal, trigs on non-fasting labs are mildly elevated. The cholesterol last visit was:   Lab Results  Component Value Date   CHOL 132 05/07/2019   HDL 34 (L) 05/07/2019   LDLCALC 72 05/07/2019   TRIG 193 (H) 05/07/2019   CHOLHDL 3.9 05/07/2019    He has been working on diet and exercise for T2DM (controlled in normal range on metformin -1 tab TID) and denies increased appetite, nausea, paresthesia of the feet, polydipsia, polyuria, visual disturbances and vomiting. He checks occasional fasting, typically <130, rare up to 155. Last A1C in the office was:  Lab Results  Component Value Date   HGBA1C 6.3 (H) 05/07/2019    He has CKD II associated with T2DM monitored at this office:  Lab Results  Component Value Date   GFRNONAA 83 05/07/2019   He is on thyroid medication. His medication was not changed last visit.   Lab Results  Component Value Date   TSH 2.36 05/07/2019   Patient is on Vitamin D supplement and at goal at last check:    Lab Results  Component Value Date   VD25OH 74 05/07/2019     He has history of testosterone deficiency and is on testosterone replacement, taking 400 mg every 2 weeks, last shot 07/23/2019, due tomorrow. He states that the testosterone helps with his energy, libido, muscle mass. Lab Results  Component Value Date   TESTOSTERONE 66 (L) 05/07/2019     Current Medications:  Current Outpatient Medications on File Prior to Visit  Medication Sig  . B-D 3CC LUER-LOK SYR 21GX1" 21G X 1" 3 ML MISC USE AS DIRECTED  . cetirizine (ZYRTEC) 10 MG tablet Take 10 mg by mouth daily.  . Cholecalciferol (VITAMIN D-3) 5000 UNITS TABS Take 10,000 Units by mouth daily.  . cyclobenzaprine (FLEXERIL) 10 MG tablet Take 10 mg by mouth daily as needed for muscle spasms. Takes prn  . dextroamphetamine (DEXEDRINE  SPANSULE) 15 MG 24 hr capsule Take 15 mg by mouth daily as needed.  . ferrous sulfate 325 (65 FE) MG tablet Take 325 mg by mouth daily with breakfast.  . levothyroxine (SYNTHROID, LEVOTHROID) 50 MCG tablet TAKE 1 TABLET BY MOUTH ONCE DAILY BEFORE BREAKFAST  . losartan (COZAAR) 50 MG tablet Take 50 mg by mouth daily.   . metFORMIN (GLUCOPHAGE-XR) 500 MG 24 hr tablet TAKE 2 TABLETS TWICE A DAY FOR DIABETES  . ONE TOUCH ULTRA TEST test strip TEST BLOOD SUGAR 3 TIMES A DAY  . ONETOUCH DELICA LANCETS 99991111 MISC USE TO CHECK SUGAR DAILY  . testosterone cypionate (DEPOTESTOSTERONE CYPIONATE) 200 MG/ML injection USE 2ML INTRAMUSCULARLY EVERY 2 WEEKS AS DIRECTED  . vitamin C (ASCORBIC ACID) 500 MG tablet Take 2,000 mg by mouth daily.   . Zinc 50 MG TABS Take by mouth.  . varenicline (CHANTIX CONTINUING MONTH PAK) 1 MG tablet TAKE AS DIRECTED ON PACKAGE (Patient not taking: Reported on 08/06/2019)   No current facility-administered medications on file prior to visit.     Allergies: No Known  Allergies   Medical History:  Past Medical History:  Diagnosis Date  . DDD (degenerative disc disease)   . Depression 10/09/2013  . Diabetes mellitus without complication ([place])   . GERD (gastroesophageal reflux disease)   . Hyperlipidemia   . Hypertension   . Hypogonadism male   . Vitamin D deficiency    Family history- Reviewed and unchanged Social history- Reviewed and unchanged   Review of Systems:  Review of Systems  Constitutional: Negative for malaise/fatigue and weight loss.  HENT: Negative for hearing loss and tinnitus.   Eyes: Negative for blurred vision and double vision.  Respiratory: Negative for cough, shortness of breath and wheezing.   Cardiovascular: Negative for chest pain, palpitations, orthopnea, claudication and leg swelling.  Gastrointestinal: Negative for abdominal pain, blood in stool, constipation, diarrhea, heartburn, melena, nausea and vomiting.  Genitourinary: Negative.    Musculoskeletal: Negative for joint pain and myalgias.  Skin: Negative for rash.  Neurological: Negative for dizziness, tingling, sensory change, weakness and headaches.  Endo/Heme/Allergies: Negative for polydipsia.  Psychiatric/Behavioral: Negative.   All other systems reviewed and are negative.    Physical Exam: BP 110/68   Pulse 78   Temp (!) 97.3 F (36.3 C)   Wt 183 lb (83 kg)   SpO2 95%   BMI 26.26 kg/m  Wt Readings from Last 3 Encounters:  08/06/19 183 lb (83 kg)  05/07/19 189 lb 6.4 oz (85.9 kg)  01/13/19 187 lb (84.8 kg)   General Appearance: Well nourished, in no apparent distress. Eyes: PERRLA, EOMs, conjunctiva no swelling or erythema Sinuses: No Frontal/maxillary tenderness ENT/Mouth: Ext aud canals clear, TMs without erythema, bulging. No erythema, swelling, or exudate on post pharynx.  Tonsils not swollen or erythematous. Hearing normal.  Neck: Supple, thyroid normal.  Respiratory: Respiratory effort normal, BS equal bilaterally without rales, rhonchi, wheezing or stridor.  Cardio: RRR with no MRGs. Brisk peripheral pulses without edema.  Abdomen: Soft, + BS.  Non tender, no guarding, rebound, hernias, masses. Lymphatics: Non tender without lymphadenopathy.  Musculoskeletal: Full ROM, 5/5 strength, Normal gait Skin: Warm, dry without rashes, lesions, ecchymosis.  Neuro: Cranial nerves intact. No cerebellar symptoms.  Psych: Awake and oriented X 3, normal affect, Insight and Judgment appropriate.    Izora Ribas, NP 4:04 PM [facility]

## 2019-08-06 ENCOUNTER — Ambulatory Visit: Payer: 59 | Admitting: Adult Health

## 2019-08-06 ENCOUNTER — Encounter: Payer: Self-pay | Admitting: Adult Health

## 2019-08-06 ENCOUNTER — Other Ambulatory Visit: Payer: Self-pay

## 2019-08-06 VITALS — BP 110/68 | HR 78 | Temp 97.3°F | Wt 183.0 lb

## 2019-08-06 DIAGNOSIS — N182 Chronic kidney disease, stage 2 (mild): Secondary | ICD-10-CM

## 2019-08-06 DIAGNOSIS — E1122 Type 2 diabetes mellitus with diabetic chronic kidney disease: Secondary | ICD-10-CM

## 2019-08-06 DIAGNOSIS — E1169 Type 2 diabetes mellitus with other specified complication: Secondary | ICD-10-CM

## 2019-08-06 DIAGNOSIS — E039 Hypothyroidism, unspecified: Secondary | ICD-10-CM | POA: Diagnosis not present

## 2019-08-06 DIAGNOSIS — E1129 Type 2 diabetes mellitus with other diabetic kidney complication: Secondary | ICD-10-CM

## 2019-08-06 DIAGNOSIS — F17200 Nicotine dependence, unspecified, uncomplicated: Secondary | ICD-10-CM

## 2019-08-06 DIAGNOSIS — E663 Overweight: Secondary | ICD-10-CM

## 2019-08-06 DIAGNOSIS — J449 Chronic obstructive pulmonary disease, unspecified: Secondary | ICD-10-CM | POA: Diagnosis not present

## 2019-08-06 DIAGNOSIS — E349 Endocrine disorder, unspecified: Secondary | ICD-10-CM

## 2019-08-06 DIAGNOSIS — I10 Essential (primary) hypertension: Secondary | ICD-10-CM | POA: Diagnosis not present

## 2019-08-06 DIAGNOSIS — F988 Other specified behavioral and emotional disorders with onset usually occurring in childhood and adolescence: Secondary | ICD-10-CM

## 2019-08-06 DIAGNOSIS — E785 Hyperlipidemia, unspecified: Secondary | ICD-10-CM

## 2019-08-06 DIAGNOSIS — Z79899 Other long term (current) drug therapy: Secondary | ICD-10-CM

## 2019-08-06 DIAGNOSIS — E559 Vitamin D deficiency, unspecified: Secondary | ICD-10-CM

## 2019-08-06 NOTE — Patient Instructions (Addendum)
  Goals    . HEMOGLOBIN A1C < 5.7    . LDL CALC < 70    . Weight (lb) < 175 lb (79.4 kg)         SMOKING CESSATION  [employer] society  NX:2814358 for more information or for a free program for smoking cessation help.   You can call QUIT SMART 1-800-QUIT-NOW for free nicotine patches or replacement therapy- if they are out- keep calling  [facility] Can call for smoking cessation classes, [phone]  If you have a smart phone, please look up Smoke Free app, this will help you stay on track and give you information about money you have saved, life that you have gained back and a ton of more information.     ADVANTAGES OF QUITTING SMOKING  Within 20 minutes, blood pressure decreases. Your pulse is at normal level.  After 8 hours, carbon monoxide levels in the blood return to normal. Your oxygen level increases.  After 24 hours, the chance of having a heart attack starts to decrease. Your breath, hair, and body stop smelling like smoke.  After 48 hours, damaged nerve endings begin to recover. Your sense of taste and smell improve.  After 72 hours, the body is virtually free of nicotine. Your bronchial tubes relax and breathing becomes easier.  After 2 to 12 weeks, lungs can hold more air. Exercise becomes easier and circulation improves.  After 1 year, the risk of coronary heart disease is cut in half.  After 5 years, the risk of stroke falls to the same as a nonsmoker.  After 10 years, the risk of lung cancer is cut in half and the risk of other cancers decreases significantly.  After 15 years, the risk of coronary heart disease drops, usually to the level of a nonsmoker.  You will have extra money to spend on things other than cigarettes.     A great goal to work towards is aiming to get in a serving daily of some of the most nutritionally dense foods - G- BOMBS daily   5-7 servings of fruits/veggies daily - 1/2 cup each (1 cup for raw  greens)

## 2019-08-07 LAB — CBC WITH DIFFERENTIAL/PLATELET
Absolute Monocytes: 738 cells/uL (ref 200–950)
Basophils Absolute: 43 cells/uL (ref 0–200)
Basophils Relative: 0.4 %
Eosinophils Absolute: 128 cells/uL (ref 15–500)
Eosinophils Relative: 1.2 %
HCT: 45.9 % (ref 38.5–50.0)
Hemoglobin: 15.3 g/dL (ref 13.2–17.1)
Lymphs Abs: 4023 cells/uL — ABNORMAL HIGH (ref 850–3900)
MCH: 30 pg (ref 27.0–33.0)
MCHC: 33.3 g/dL (ref 32.0–36.0)
MCV: 90 fL (ref 80.0–100.0)
MPV: 10.8 fL (ref 7.5–12.5)
Monocytes Relative: 6.9 %
Neutro Abs: 5767 cells/uL (ref 1500–7800)
Neutrophils Relative %: 53.9 %
Platelets: 253 10*3/uL (ref 140–400)
RBC: 5.1 10*6/uL (ref 4.20–5.80)
RDW: 13.2 % (ref 11.0–15.0)
Total Lymphocyte: 37.6 %
WBC: 10.7 10*3/uL (ref 3.8–10.8)

## 2019-08-07 LAB — LIPID PANEL
Cholesterol: 113 mg/dL (ref <200)
HDL: 35 mg/dL — ABNORMAL LOW (ref >=40)
LDL Cholesterol (Calc): 55 mg/dL (calc)
Non-HDL Cholesterol (Calc): 78 mg/dL (calc) (ref <130)
Total CHOL/HDL Ratio: 3.2 (calc) (ref <5.0)
Triglycerides: 146 mg/dL (ref <150)

## 2019-08-07 LAB — COMPLETE METABOLIC PANEL WITH GFR
AG Ratio: 1.8 (calc) (ref 1.0–2.5)
ALT: 31 U/L (ref 9–46)
AST: 22 U/L (ref 10–35)
Albumin: 4.6 g/dL (ref 3.6–5.1)
Alkaline phosphatase (APISO): 68 U/L (ref 35–144)
BUN: 22 mg/dL (ref 7–25)
CO2: 25 mmol/L (ref 20–32)
Calcium: 9.8 mg/dL (ref 8.6–10.3)
Chloride: 104 mmol/L (ref 98–110)
Creat: 1.06 mg/dL (ref 0.70–1.33)
GFR, Est African American: 90 mL/min/{1.73_m2} (ref >=60)
GFR, Est Non African American: 78 mL/min/{1.73_m2} (ref >=60)
Globulin: 2.5 g/dL (calc) (ref 1.9–3.7)
Glucose, Bld: 85 mg/dL (ref 65–99)
Potassium: 4.6 mmol/L (ref 3.5–5.3)
Sodium: 136 mmol/L (ref 135–146)
Total Bilirubin: 0.3 mg/dL (ref 0.2–1.2)
Total Protein: 7.1 g/dL (ref 6.1–8.1)

## 2019-08-07 LAB — HEMOGLOBIN A1C
Hgb A1c MFr Bld: 6.1 % of total Hgb — ABNORMAL HIGH (ref <5.7)
Mean Plasma Glucose: 128 (calc)
eAG (mmol/L): 7.1 (calc)

## 2019-08-07 LAB — MAGNESIUM: Magnesium: 2.3 mg/dL (ref 1.5–2.5)

## 2019-08-07 LAB — TSH: TSH: 1.88 mIU/L (ref 0.40–4.50)

## 2019-08-28 MED ORDER — METFORMIN HCL ER 500 MG PO TB24: 500.0000 mg | ORAL_TABLET | Freq: Three times a day (TID) | ORAL | 1 refills | Status: DC

## 2019-08-28 NOTE — Addendum Note (Signed)
Addended by: Izora Ribas on: 08/28/2019 04:19 PM   Modules accepted: Orders

## 2019-11-05 ENCOUNTER — Ambulatory Visit: Payer: 59 | Admitting: Internal Medicine

## 2019-11-12 ENCOUNTER — Encounter: Payer: Self-pay | Admitting: Internal Medicine

## 2019-11-12 NOTE — Patient Instructions (Signed)

## 2019-11-12 NOTE — Progress Notes (Signed)
History of Present Illness:       This very nice 59 y.o. single WM presents for 6 month follow up with HTN, HLD, T2_DM and Vitamin D Deficiency.       Patient is treated for HTN (2014)  & BP has been controlled at home. Today's BP is at goal - 120/72. Patient has had no complaints of any cardiac type chest pain, palpitations, dyspnea / orthopnea / PND, dizziness, claudication, or dependent edema.      Hyperlipidemia is controlled with diet & meds. Patient denies myalgias or other med SE's. Last Lipids were   Lab Results  Component Value Date   CHOL 88 11/13/2019   HDL 34 (L) 11/13/2019   LDLCALC 35 11/13/2019   TRIG 102 11/13/2019   CHOLHDL 2.6 11/13/2019    Also, the patient has history of T2_NIDDM (2013) and has had no symptoms of reactive hypoglycemia, diabetic polys, paresthesias or visual blurring.  Last A1c was near , but not at goal:  Lab Results  Component Value Date   HGBA1C 5.9 (H) 11/13/2019             Patient was dx'd Hypothyroid in 2015 and has been on Thyroid Replacement since then.       Patient also has Testosterone Deficiency & is on Depo  Testosterone with improved stamina & sense of well being.      Further, the patient also has history of Vitamin D Deficiency ("33"/2013) and supplements vitamin D without any suspected side-effects. Last vitamin D was at goal:  Lab Results  Component Value Date   VD25OH 84 11/13/2019    Current Outpatient Medications on File Prior to Visit  Medication Sig  . Ascorbic Acid (VITAMIN C/NATURAL ROSE HIPS PO) Take 500 mg by mouth daily. Takes 2 tabs daily  . B-D 3CC LUER-LOK SYR 21GX1" 21G X 1" 3 ML MISC USE AS DIRECTED  . cetirizine (ZYRTEC) 10 MG tablet Take 10 mg by mouth daily.  . Cholecalciferol (VITAMIN D-3) 5000 UNITS TABS Take 5,000 Units by mouth daily.  . cyclobenzaprine (FLEXERIL) 10 MG tablet Take 10 mg by mouth daily as needed for muscle spasms. Takes prn  . dextroamphetamine (DEXEDRINE SPANSULE) 15  MG 24 hr capsule Take 15 mg by mouth daily as needed.  . ferrous sulfate 325 (65 FE) MG tablet Take 325 mg by mouth daily with breakfast.  . levothyroxine (SYNTHROID, LEVOTHROID) 50 MCG tablet TAKE 1 TABLET BY MOUTH ONCE DAILY BEFORE BREAKFAST  . losartan (COZAAR) 50 MG tablet Take 50 mg by mouth daily.   . Magnesium Hydroxide (MAGNESIA PO) Take 1 capsule by mouth daily. 50 mg cap  . metFORMIN (GLUCOPHAGE-XR) 500 MG 24 hr tablet Take 1 tablet (500 mg total) by mouth 3 (three) times daily with meals.  . Omega-3 Fatty Acids (FISH OIL) 1000 MG CAPS Take 4 capsules by mouth daily.  . ONE TOUCH ULTRA TEST test strip TEST BLOOD SUGAR 3 TIMES A DAY  . ONETOUCH DELICA LANCETS 23N MISC USE TO CHECK SUGAR DAILY  . testosterone cypionate (DEPOTESTOSTERONE CYPIONATE) 200 MG/ML injection USE 2ML INTRAMUSCULARLY EVERY 2 WEEKS AS DIRECTED  . vitamin C (ASCORBIC ACID) 500 MG tablet Take 2,000 mg by mouth daily.   . Zinc 50 MG TABS Take by mouth.   No current facility-administered medications on file prior to visit.    No Known Allergies  PMHx:   Past Medical History:  Diagnosis Date  . DDD (degenerative disc  disease)   . Depression 10/09/2013  . Diabetes mellitus without complication ([place])   . GERD (gastroesophageal reflux disease)   . Hyperlipidemia   . Hypertension   . Hypogonadism male   . Vitamin D deficiency     Immunization History  Administered Date(s) Administered  . DTaP 04/04/2001, 04/24/2008  . Influenza Inj Mdck Quad With Preservative 02/21/2017  . Influenza Whole 01/09/2013  . Influenza,inj,quad, With Preservative 02/17/2016  . Moderna SARS-COVID-2 Vaccination 07/04/2019, 07/25/2019  . PPD Test 10/09/2013, 10/13/2014, 11/17/2015, 02/21/2017, 04/02/2018, 05/07/2019  . Pneumococcal Polysaccharide-23 02/21/2017  . Tdap 07/17/2013    Past Surgical History:  Procedure Laterality Date  . 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 CERVICAL FUSION  1991  . ESOPHAGOGASTRODUODENOSCOPY   1987,1992,1993,1995   duodenal ulcer  . LUMBAR DISC SURGERY    . LUMBAR FUSION  2001  . LUMBAR LAMINECTOMY  7/97    FHx:    Reviewed / unchanged  SHx:    Reviewed / unchanged   Systems Review:  Constitutional: Denies fever, chills, wt changes, headaches, insomnia, fatigue, night sweats, change in appetite. Eyes: Denies redness, blurred vision, diplopia, discharge, itchy, watery eyes.  ENT: Denies discharge, congestion, post nasal drip, epistaxis, sore throat, earache, hearing loss, dental pain, tinnitus, vertigo, sinus pain, snoring.  CV: Denies chest pain, palpitations, irregular heartbeat, syncope, dyspnea, diaphoresis, orthopnea, PND, claudication or edema. Respiratory: denies cough, dyspnea, DOE, pleurisy, hoarseness, laryngitis, wheezing.  Gastrointestinal: Denies dysphagia, odynophagia, heartburn, reflux, water brash, abdominal pain or cramps, nausea, vomiting, bloating, diarrhea, constipation, hematemesis, melena, hematochezia  or hemorrhoids. Genitourinary: Denies dysuria, frequency, urgency, nocturia, hesitancy, discharge, hematuria or flank pain. Musculoskeletal: Denies arthralgias, myalgias, stiffness, jt. swelling, pain, limping or strain/sprain.  Skin: Denies pruritus, rash, hives, warts, acne, eczema or change in skin lesion(s). Neuro: No weakness, tremor, incoordination, spasms, paresthesia or pain. Psychiatric: Denies confusion, memory loss or sensory loss. Endo: Denies change in weight, skin or hair change.  Heme/Lymph: No excessive bleeding, bruising or enlarged lymph nodes.  Physical Exam  BP 120/72   Pulse 76   Temp (!) 97.2 F (36.2 C)   Resp 16   Ht 5\' 10"  (1.778 m)   Wt 182 lb 6.4 oz (82.7 kg)   BMI 26.17 kg/m   Appears  well nourished, well groomed  and in no distress.  Eyes: PERRLA, EOMs, conjunctiva no swelling or erythema. Sinuses: No frontal/maxillary tenderness ENT/Mouth: EAC's clear, TM's nl w/o erythema, bulging. Nares clear w/o erythema,  swelling, exudates. Oropharynx clear without erythema or exudates. Oral hygiene is good. Tongue normal, non obstructing. Hearing intact.  Neck: Supple. Thyroid not palpable. Car 2+/2+ without bruits, nodes or JVD. Chest: Respirations nl with BS clear & equal w/o rales, rhonchi, wheezing or stridor.  Cor: Heart sounds normal w/ regular rate and rhythm without sig. murmurs, gallops, clicks or rubs. Peripheral pulses normal and equal  without edema.  Abdomen: Soft & bowel sounds normal. Non-tender w/o guarding, rebound, hernias, masses or organomegaly.  Lymphatics: Unremarkable.  Musculoskeletal: Full ROM all peripheral extremities, joint stability, 5/5 strength and normal gait.  Skin: Warm, dry without exposed rashes, lesions or ecchymosis apparent.  Neuro: Cranial nerves intact, reflexes equal bilaterally. Sensory-motor testing grossly intact. Tendon reflexes grossly intact.  Pysch: Alert & oriented x 3.  Insight and judgement nl & appropriate. No ideations.  Assessment and Plan:  1. Essential hypertension  - Continue medication, monitor blood pressure at home.  - Continue DASH diet.  Reminder to go to the  ER if any CP,  SOB, nausea, dizziness, severe HA, changes vision/speech.  - CBC with Differential/Platelet - COMPLETE METABOLIC PANEL WITH GFR - Magnesium - TSH  2. Hyperlipidemia associated with type 2 diabetes mellitus ([place])  - Continue diet/meds, exercise,& lifestyle modifications.  - Continue monitor periodic cholesterol/liver & renal functions   - Lipid panel - TSH  3. 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  - Monitor appropriate labs.  - Hemoglobin A1c - Insulin, random  4. Vitamin D deficiency  - Continue supplementation.  - VITAMIN D 25 Hydroxy  5. Hypothyroidism  - TSH  6. Testosterone deficiency  - Testosterone  7. Attention deficit disorder   8. Medication management  - CBC with  Differential/Platelet - COMPLETE METABOLIC PANEL WITH GFR - Magnesium - Lipid panel - TSH - Hemoglobin A1c - Insulin, random - VITAMIN D 25 Hydroxy  - Testosterone         Discussed  regular exercise, BP monitoring, weight control to achieve/maintain BMI less than 25 and discussed med and SE's. Recommended labs to assess and monitor clinical status with further disposition pending results of labs.  I discussed the assessment and treatment plan with the patient. The patient was provided an opportunity to ask questions and all were answered. The patient agreed with the plan and demonstrated an understanding of the instructions.  I provided over 30 minutes of exam, counseling, chart review and  complex critical decision making.   Kirtland Bouchard, MD

## 2019-11-13 ENCOUNTER — Ambulatory Visit: Payer: 59 | Admitting: Internal Medicine

## 2019-11-13 ENCOUNTER — Other Ambulatory Visit: Payer: Self-pay

## 2019-11-13 VITALS — BP 120/72 | HR 76 | Temp 97.2°F | Resp 16 | Ht 70.0 in | Wt 182.4 lb

## 2019-11-13 DIAGNOSIS — E349 Endocrine disorder, unspecified: Secondary | ICD-10-CM

## 2019-11-13 DIAGNOSIS — E1129 Type 2 diabetes mellitus with other diabetic kidney complication: Secondary | ICD-10-CM | POA: Diagnosis not present

## 2019-11-13 DIAGNOSIS — F988 Other specified behavioral and emotional disorders with onset usually occurring in childhood and adolescence: Secondary | ICD-10-CM

## 2019-11-13 DIAGNOSIS — E559 Vitamin D deficiency, unspecified: Secondary | ICD-10-CM | POA: Diagnosis not present

## 2019-11-13 DIAGNOSIS — E1169 Type 2 diabetes mellitus with other specified complication: Secondary | ICD-10-CM

## 2019-11-13 DIAGNOSIS — E039 Hypothyroidism, unspecified: Secondary | ICD-10-CM

## 2019-11-13 DIAGNOSIS — E785 Hyperlipidemia, unspecified: Secondary | ICD-10-CM

## 2019-11-13 DIAGNOSIS — I10 Essential (primary) hypertension: Secondary | ICD-10-CM | POA: Diagnosis not present

## 2019-11-13 DIAGNOSIS — Z79899 Other long term (current) drug therapy: Secondary | ICD-10-CM

## 2019-11-14 ENCOUNTER — Other Ambulatory Visit: Payer: Self-pay | Admitting: Internal Medicine

## 2019-11-14 DIAGNOSIS — F17200 Nicotine dependence, unspecified, uncomplicated: Secondary | ICD-10-CM

## 2019-11-14 LAB — CBC WITH DIFFERENTIAL/PLATELET
Absolute Monocytes: 832 cells/uL (ref 200–950)
Basophils Absolute: 42 cells/uL (ref 0–200)
Basophils Relative: 0.4 %
Eosinophils Absolute: 73 cells/uL (ref 15–500)
Eosinophils Relative: 0.7 %
HCT: 48.5 % (ref 38.5–50.0)
Hemoglobin: 16.1 g/dL (ref 13.2–17.1)
Lymphs Abs: 3255 cells/uL (ref 850–3900)
MCH: 30.4 pg (ref 27.0–33.0)
MCHC: 33.2 g/dL (ref 32.0–36.0)
MCV: 91.5 fL (ref 80.0–100.0)
MPV: 11.4 fL (ref 7.5–12.5)
Monocytes Relative: 8 %
Neutro Abs: 6198 cells/uL (ref 1500–7800)
Neutrophils Relative %: 59.6 %
Platelets: 270 10*3/uL (ref 140–400)
RBC: 5.3 10*6/uL (ref 4.20–5.80)
RDW: 13 % (ref 11.0–15.0)
Total Lymphocyte: 31.3 %
WBC: 10.4 10*3/uL (ref 3.8–10.8)

## 2019-11-14 LAB — COMPLETE METABOLIC PANEL WITH GFR
AG Ratio: 2.3 (calc) (ref 1.0–2.5)
ALT: 26 U/L (ref 9–46)
AST: 22 U/L (ref 10–35)
Albumin: 4.9 g/dL (ref 3.6–5.1)
Alkaline phosphatase (APISO): 66 U/L (ref 35–144)
BUN: 20 mg/dL (ref 7–25)
CO2: 27 mmol/L (ref 20–32)
Calcium: 10.3 mg/dL (ref 8.6–10.3)
Chloride: 105 mmol/L (ref 98–110)
Creat: 1.01 mg/dL (ref 0.70–1.33)
GFR, Est African American: 96 mL/min/{1.73_m2} (ref >=60)
GFR, Est Non African American: 83 mL/min/{1.73_m2} (ref >=60)
Globulin: 2.1 g/dL (calc) (ref 1.9–3.7)
Glucose, Bld: 78 mg/dL (ref 65–99)
Potassium: 4.5 mmol/L (ref 3.5–5.3)
Sodium: 138 mmol/L (ref 135–146)
Total Bilirubin: 0.4 mg/dL (ref 0.2–1.2)
Total Protein: 7 g/dL (ref 6.1–8.1)

## 2019-11-14 LAB — VITAMIN D 25 HYDROXY (VIT D DEFICIENCY, FRACTURES): Vit D, 25-Hydroxy: 84 ng/mL (ref 30–100)

## 2019-11-14 LAB — MAGNESIUM: Magnesium: 2 mg/dL (ref 1.5–2.5)

## 2019-11-14 LAB — LIPID PANEL
Cholesterol: 88 mg/dL (ref <200)
HDL: 34 mg/dL — ABNORMAL LOW (ref >=40)
LDL Cholesterol (Calc): 35 mg/dL (calc)
Non-HDL Cholesterol (Calc): 54 mg/dL (calc) (ref <130)
Total CHOL/HDL Ratio: 2.6 (calc) (ref <5.0)
Triglycerides: 102 mg/dL (ref <150)

## 2019-11-14 LAB — HEMOGLOBIN A1C
Hgb A1c MFr Bld: 5.9 % of total Hgb — ABNORMAL HIGH (ref <5.7)
Mean Plasma Glucose: 123 (calc)
eAG (mmol/L): 6.8 (calc)

## 2019-11-14 LAB — TESTOSTERONE: Testosterone: 444 ng/dL (ref 250–827)

## 2019-11-14 LAB — INSULIN, RANDOM: Insulin: 9 u[IU]/mL

## 2019-11-14 LAB — TSH: TSH: 2.34 mIU/L (ref 0.40–4.50)

## 2019-11-14 MED ORDER — VARENICLINE TARTRATE 1 MG PO TABS: ORAL_TABLET | ORAL | 2 refills | Status: DC

## 2019-11-14 NOTE — Progress Notes (Signed)
===========================================================  -   Total Chol = 88 and LDL = 35 - Both  Excellent   - Very low risk for Heart Attack  / Stroke =============================================================  - A1c = 5.9%  - Great  ===========================================================  - Vitamin D = 84 - Excellent  ===========================================================  - Testosterone = 444 - Normal  ===========================================================  - All Else - CBC - Kidneys - Electrolytes - Liver - Magnesium & Thyroid    - all  Normal / OK ===========================================================

## 2020-01-16 IMAGING — CR DG CHEST 2V
2 series · 2 of 2 positions shown · non-contrast
Comparison: 12/06/2015

CLINICAL DATA: Epigastric pain

EXAM:
CHEST - 2 VIEW

[chest pa]
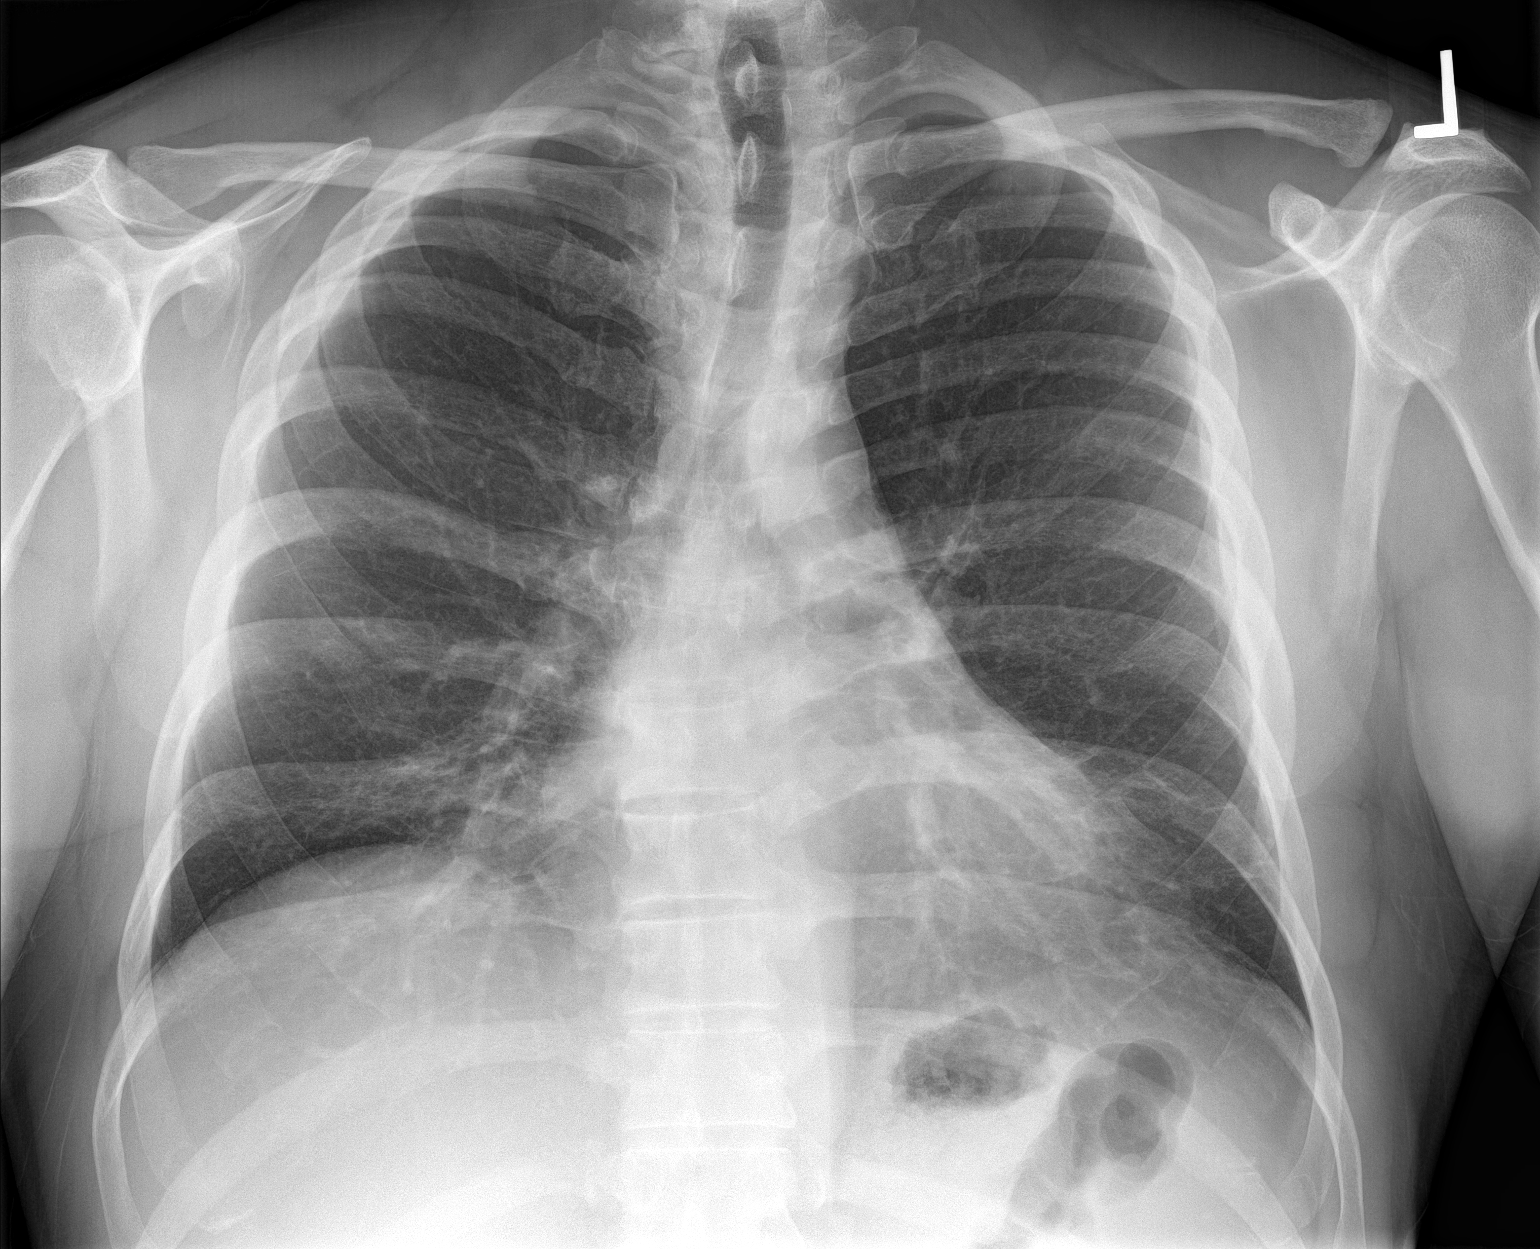

[chest lat]
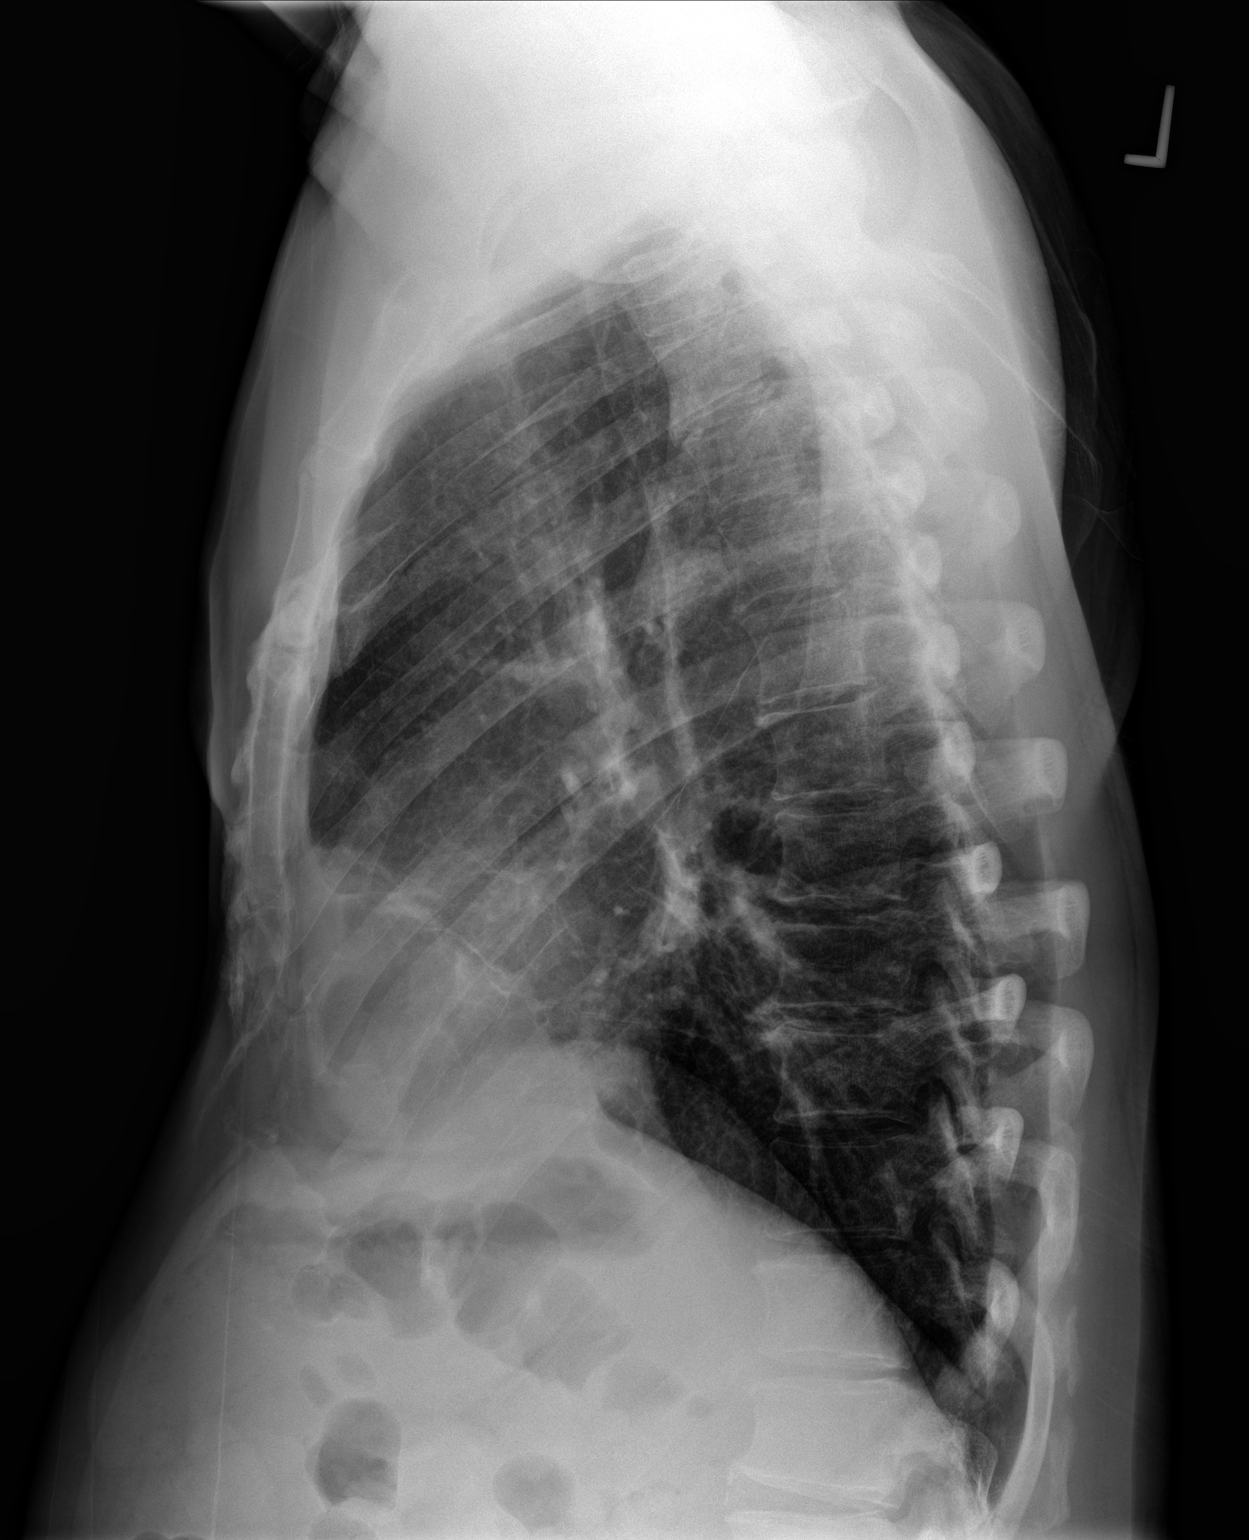

[2 of 2 positions shown; findings below may reference images not displayed]

FINDINGS: Bibasilar atelectasis or scarring. Heart is upper limits normal in
size. No effusions. No acute bony abnormality.
IMPRESSION: Bibasilar atelectasis or scarring.

## 2020-02-02 LAB — HM DIABETES EYE EXAM

## 2020-02-16 ENCOUNTER — Ambulatory Visit: Payer: 59 | Admitting: Adult Health Nurse Practitioner

## 2020-02-16 ENCOUNTER — Encounter: Payer: Self-pay | Admitting: Adult Health Nurse Practitioner

## 2020-02-16 ENCOUNTER — Other Ambulatory Visit: Payer: Self-pay

## 2020-02-16 VITALS — BP 110/78 | HR 65 | Temp 97.5°F | Wt 183.0 lb

## 2020-02-16 DIAGNOSIS — E1129 Type 2 diabetes mellitus with other diabetic kidney complication: Secondary | ICD-10-CM | POA: Diagnosis not present

## 2020-02-16 DIAGNOSIS — M5442 Lumbago with sciatica, left side: Secondary | ICD-10-CM

## 2020-02-16 DIAGNOSIS — E039 Hypothyroidism, unspecified: Secondary | ICD-10-CM

## 2020-02-16 DIAGNOSIS — E1169 Type 2 diabetes mellitus with other specified complication: Secondary | ICD-10-CM | POA: Diagnosis not present

## 2020-02-16 DIAGNOSIS — E785 Hyperlipidemia, unspecified: Secondary | ICD-10-CM

## 2020-02-16 DIAGNOSIS — G8929 Other chronic pain: Secondary | ICD-10-CM

## 2020-02-16 DIAGNOSIS — M5441 Lumbago with sciatica, right side: Secondary | ICD-10-CM

## 2020-02-16 DIAGNOSIS — I10 Essential (primary) hypertension: Secondary | ICD-10-CM | POA: Diagnosis not present

## 2020-02-16 DIAGNOSIS — E559 Vitamin D deficiency, unspecified: Secondary | ICD-10-CM

## 2020-02-16 DIAGNOSIS — R3912 Poor urinary stream: Secondary | ICD-10-CM

## 2020-02-16 NOTE — Progress Notes (Signed)
` FOLLOW UP  Assessment and Plan:   Hypertension Well controlled with current medications  Monitor blood pressure at home; patient to call if consistently greater than 130/80 Continue DASH diet.   Reminder to go to the ER if any CP, SOB, nausea, dizziness, severe HA, changes vision/speech, left arm numbness and tingling and jaw pain.   Hyperlipidemia associated with T2DM ([place]) Currently at LDL goal (<70) by lifestyle, omega 3  Discussed consider low dose statin for risk reduction  Continue low cholesterol diet and exercise.  Check lipid panel.   Diabetes with diabetic chronic kidney disease ([place]) Continue medication: metformin 3 tabs daily Continue diet and exercise, progress with weight loss, goal to get off diabetic medications Low glycemic index diet reviewed today  Perform daily foot/skin check, notify office of any concerning changes.  Check A1C  CKD II associated with T2DM ([facility]) Increase fluids, avoid NSAIDS, monitor sugars, will monitor  Obesity with co morbidities Long discussion about weight loss, diet, and exercise Recommended diet heavy in fruits and veggies and low in animal meats, cheeses, and dairy products, appropriate calorie intake Discussed ideal weight for height and initial weight goal (175 lb)  Patient will work on continue to make sustainable small good choices Motivated to reverse diabetes Will follow up in 3 months  Vitamin D Def At goal at last chec; continue supplementation Defer vit D level to CPE  Hypogonadism - continue to monitor, states medication is helping with symptoms of low T.   Hypothyroidism continue medications the same: reminded to take on an empty stomach 30-42mins before food.  check TSH level  Tobacco use Discussed risks associated with tobacco use and advised to reduce or quit Patient is not ready to do so but plans to do chantix  Will follow up at the next visit  ADD Continue medications Helps with focus, no AE's; uses  rarely The patient was counseled on the addictive nature of the medication and was encouraged to take drug holidays when not needed.    Continue diet and meds as discussed. Further disposition pending results of labs. Discussed med's effects and SE's.   Over 30 minutes of exam, counseling, chart review, and critical decision making was performed.   Future Appointments  Date Time Provider [place]  05/26/2020  3:00 PM [name], MD [facility] None    ----------------------------------------------------------------------------------------------------------------------  HPI 59 y.o. male  presents for 3 month follow up on hypertension, cholesterol, T2 diabetes, weight, hypothyroid, tobacco use, hypogonadism on testosterone supplement, ADD and vitamin D deficiency.    Reports he has history of back pain and has DDD.  He had L3-L4.fusion in 2001.  AT that time MRI showed mild buldge at L4-5 and L5-S1 disc intact. He is having severe pain numbness and tingling on the right side al the way down to his calf.  He has been doing stretching to the pyriformis muscle. He has a prescription of flexeril, prescribed, and ibuprofen which has not been effective in resolution of his pain.He reports that he tried yoga and that seemed to help.  He would like a referral for [facility] as Dr Durene Cal did he previous surgeries and is now retired.   he currently continues to smoke 1/2 pack (down from 1 pack average since 1980); discussed risks associated with smoking, patient is not ready to quit.  He was prescribed chantix knows he needs to be ready to quit, plans to sometime this year but not ready to set a date.  He has  COPD per imaging, CXR. Denies coughing, dyspnea, wheezing.  Patient is on an ADD medication, he states that the medication is helping and he denies any adverse reactions. He takes 15 mg dextroamphetamine PRN; he takes very rarely - typically 1-2 occasions a month.   BMI is Body mass  index is 26.26 kg/m., he has been working on diet but not much exercise. He does plan to start biking again. He has cut out sweet tea, he does drink some soda, does have coffee with sugar. He has made small adjustments. Weight goal 175 lb.  Wt Readings from Last 3 Encounters:  02/16/20 183 lb (83 kg)  11/13/19 182 lb 6.4 oz (82.7 kg)  08/06/19 183 lb (83 kg)   His blood pressure has been controlled at home, today their BP is BP: 110/78  On losartan for renal benefits with T2DM.   He does workout. He denies chest pain, shortness of breath, dizziness.   He is not on cholesterol medication, only taking fish oil  and denies myalgias. His LDL cholesterol is at goal, trigs on non-fasting labs are mildly elevated. The cholesterol last visit was:   Lab Results  Component Value Date   CHOL 88 11/13/2019   HDL 34 (L) 11/13/2019   LDLCALC 35 11/13/2019   TRIG 102 11/13/2019   CHOLHDL 2.6 11/13/2019    He has been working on diet and exercise for T2DM (controlled in normal range on metformin -1 tab TID) and denies increased appetite, nausea, paresthesia of the feet, polydipsia, polyuria, visual disturbances and vomiting. He checks occasional fasting, typically <130, rare up to 155. Last A1C in the office was:  Lab Results  Component Value Date   HGBA1C 5.9 (H) 11/13/2019    He has CKD II associated with T2DM monitored at this office:  Lab Results  Component Value Date   [facility] 83 11/13/2019   He is on thyroid medication. His medication was not changed last visit.   Lab Results  Component Value Date   TSH 2.34 11/13/2019   Patient is on Vitamin D supplement and at goal at last check:    Lab Results  Component Value Date   VD25OH 84 11/13/2019     He has history of testosterone deficiency and is on testosterone replacement, taking 400 mg every 2 weeks, last shot 07/23/2019, due tomorrow. He states that the testosterone helps with his energy, libido, muscle mass. Lab Results  Component  Value Date   TESTOSTERONE 444 11/13/2019     Current Medications:  Current Outpatient Medications on File Prior to Visit  Medication Sig  . Ascorbic Acid (VITAMIN C/NATURAL ROSE HIPS PO) Take 500 mg by mouth daily. Takes 2 tabs daily  . B-D 3CC LUER-LOK SYR 21GX1" 21G X 1" 3 ML MISC USE AS DIRECTED  . cetirizine (ZYRTEC) 10 MG tablet Take 10 mg by mouth daily.  . Cholecalciferol (VITAMIN D-3) 5000 UNITS TABS Take 5,000 Units by mouth daily.  . cyclobenzaprine (FLEXERIL) 10 MG tablet Take 10 mg by mouth daily as needed for muscle spasms. Takes prn  . dextroamphetamine (DEXEDRINE SPANSULE) 15 MG 24 hr capsule Take 15 mg by mouth daily as needed.  . ferrous sulfate 325 (65 FE) MG tablet Take 325 mg by mouth daily with breakfast.  . levothyroxine (SYNTHROID, LEVOTHROID) 50 MCG tablet TAKE 1 TABLET BY MOUTH ONCE DAILY BEFORE BREAKFAST  . losartan (COZAAR) 50 MG tablet Take 50 mg by mouth daily.   . Magnesium Hydroxide (MAGNESIA PO) Take 1  capsule by mouth daily. 50 mg cap  . metFORMIN (GLUCOPHAGE-XR) 500 MG 24 hr tablet Take 1 tablet (500 mg total) by mouth 3 (three) times daily with meals.  . Omega-3 Fatty Acids (FISH OIL) 1000 MG CAPS Take 4 capsules by mouth daily.  . ONE TOUCH ULTRA TEST test strip TEST BLOOD SUGAR 3 TIMES A DAY  . ONETOUCH DELICA LANCETS 63O MISC USE TO CHECK SUGAR DAILY  . testosterone cypionate (DEPOTESTOSTERONE CYPIONATE) 200 MG/ML injection USE 2ML INTRAMUSCULARLY EVERY 2 WEEKS AS DIRECTED  . varenicline (CHANTIX CONTINUING MONTH PAK) 1 MG tablet Take 1 tablet 2 x /day for tobacco cessation  . vitamin C (ASCORBIC ACID) 500 MG tablet Take 2,000 mg by mouth daily.   . Zinc 50 MG TABS Take by mouth.   No current facility-administered medications on file prior to visit.     Allergies: No Known Allergies   Medical History:  Past Medical History:  Diagnosis Date  . DDD (degenerative disc disease)   . Depression 10/09/2013  . Diabetes mellitus without complication  ([place])   . GERD (gastroesophageal reflux disease)   . Hyperlipidemia   . Hypertension   . Hypogonadism male   . Vitamin D deficiency    Family history- Reviewed and unchanged Social history- Reviewed and unchanged   Review of Systems:  Review of Systems  Constitutional: Negative for malaise/fatigue and weight loss.  HENT: Negative for hearing loss and tinnitus.   Eyes: Negative for blurred vision and double vision.  Respiratory: Negative for cough, shortness of breath and wheezing.   Cardiovascular: Negative for chest pain, palpitations, orthopnea, claudication and leg swelling.  Gastrointestinal: Negative for abdominal pain, blood in stool, constipation, diarrhea, heartburn, melena, nausea and vomiting.  Genitourinary: Negative.   Musculoskeletal: Negative for joint pain and myalgias.  Skin: Negative for rash.  Neurological: Negative for dizziness, tingling, sensory change, weakness and headaches.  Endo/Heme/Allergies: Negative for polydipsia.  Psychiatric/Behavioral: Negative.   All other systems reviewed and are negative.    Physical Exam: BP 110/78   Pulse 65   Temp (!) 97.5 F (36.4 C)   Wt 183 lb (83 kg)   SpO2 99%   BMI 26.26 kg/m  Wt Readings from Last 3 Encounters:  02/16/20 183 lb (83 kg)  11/13/19 182 lb 6.4 oz (82.7 kg)  08/06/19 183 lb (83 kg)   General Appearance: Well nourished, in no apparent distress. Eyes: PERRLA, EOMs, conjunctiva no swelling or erythema Sinuses: No Frontal/maxillary tenderness ENT/Mouth: Ext aud canals clear, TMs without erythema, bulging. No erythema, swelling, or exudate on post pharynx.  Tonsils not swollen or erythematous. Hearing normal.  Neck: Supple, thyroid normal.  Respiratory: Respiratory effort normal, BS equal bilaterally without rales, rhonchi, wheezing or stridor.  Cardio: RRR with no MRGs. Brisk peripheral pulses without edema.  Abdomen: Soft, + BS.  Non tender, no guarding, rebound, hernias, masses. Lymphatics: Non  tender without lymphadenopathy.  Musculoskeletal: Full ROM, 5/5 strength, Normal gait Skin: Warm, dry without rashes, lesions, ecchymosis.  Neuro: Cranial nerves intact. No cerebellar symptoms.  Psych: Awake and oriented X 3, normal affect, Insight and Judgment appropriate.    Garnet Sierras, NP 4:37 PM [facility]

## 2020-02-17 LAB — COMPLETE METABOLIC PANEL WITHOUT GFR
AG Ratio: 1.9 (calc) (ref 1.0–2.5)
ALT: 23 U/L (ref 9–46)
AST: 20 U/L (ref 10–35)
Albumin: 4.5 g/dL (ref 3.6–5.1)
Alkaline phosphatase (APISO): 62 U/L (ref 35–144)
BUN: 17 mg/dL (ref 7–25)
CO2: 25 mmol/L (ref 20–32)
Calcium: 9.5 mg/dL (ref 8.6–10.3)
Chloride: 104 mmol/L (ref 98–110)
Creat: 0.94 mg/dL (ref 0.70–1.33)
GFR, Est African American: 104 mL/min/1.73m2 (ref >=60)
GFR, Est Non African American: 90 mL/min/1.73m2 (ref >=60)
Globulin: 2.4 g/dL (ref 1.9–3.7)
Glucose, Bld: 82 mg/dL (ref 65–99)
Potassium: 4.3 mmol/L (ref 3.5–5.3)
Sodium: 137 mmol/L (ref 135–146)
Total Bilirubin: 0.3 mg/dL (ref 0.2–1.2)
Total Protein: 6.9 g/dL (ref 6.1–8.1)

## 2020-02-17 LAB — CBC WITH DIFFERENTIAL/PLATELET
Absolute Monocytes: 1050 {cells}/uL — ABNORMAL HIGH (ref 200–950)
Basophils Absolute: 51 {cells}/uL (ref 0–200)
Basophils Relative: 0.4 %
Eosinophils Absolute: 128 {cells}/uL (ref 15–500)
Eosinophils Relative: 1 %
HCT: 49.3 % (ref 38.5–50.0)
Hemoglobin: 16.7 g/dL (ref 13.2–17.1)
Lymphs Abs: 3622 {cells}/uL (ref 850–3900)
MCH: 30.4 pg (ref 27.0–33.0)
MCHC: 33.9 g/dL (ref 32.0–36.0)
MCV: 89.8 fL (ref 80.0–100.0)
MPV: 10.9 fL (ref 7.5–12.5)
Monocytes Relative: 8.2 %
Neutro Abs: 7949 {cells}/uL — ABNORMAL HIGH (ref 1500–7800)
Neutrophils Relative %: 62.1 %
Platelets: 239 Thousand/uL (ref 140–400)
RBC: 5.49 Million/uL (ref 4.20–5.80)
RDW: 13.4 % (ref 11.0–15.0)
Total Lymphocyte: 28.3 %
WBC: 12.8 Thousand/uL — ABNORMAL HIGH (ref 3.8–10.8)

## 2020-02-17 LAB — PSA: PSA: 1.72 ng/mL (ref <=4.0)

## 2020-02-17 LAB — MAGNESIUM: Magnesium: 2.4 mg/dL (ref 1.5–2.5)

## 2020-02-17 LAB — HEMOGLOBIN A1C
Hgb A1c MFr Bld: 6.4 % of total Hgb — ABNORMAL HIGH (ref <5.7)
Mean Plasma Glucose: 137 (calc)
eAG (mmol/L): 7.6 (calc)

## 2020-02-17 LAB — LIPID PANEL
Cholesterol: 110 mg/dL (ref <200)
HDL: 28 mg/dL — ABNORMAL LOW (ref >=40)
LDL Cholesterol (Calc): 56 mg/dL (calc)
Non-HDL Cholesterol (Calc): 82 mg/dL (calc) (ref <130)
Total CHOL/HDL Ratio: 3.9 (calc) (ref <5.0)
Triglycerides: 187 mg/dL — ABNORMAL HIGH (ref <150)

## 2020-02-17 LAB — TSH: TSH: 3.51 m[IU]/L (ref 0.40–4.50)

## 2020-02-18 MED ORDER — MELOXICAM 15 MG PO TABS: ORAL_TABLET | ORAL | 1 refills | Status: DC

## 2020-02-19 NOTE — Addendum Note (Signed)
Addended byGarnet Sierras A on: 02/19/2020 05:03 PM   Modules accepted: Orders

## 2020-02-24 ENCOUNTER — Encounter: Payer: Self-pay | Admitting: Internal Medicine

## 2020-02-25 NOTE — Progress Notes (Signed)
This REFERRAL request has been faxed----just as a follow up

## 2020-03-03 ENCOUNTER — Telehealth: Payer: Self-pay

## 2020-03-03 NOTE — Telephone Encounter (Signed)
PATIENT has an appt with Addison........04/09/2020 at 12:15pm.   [place] office [address] [place], [place] [address]

## 2020-04-07 ENCOUNTER — Telehealth: Payer: Self-pay | Admitting: *Deleted

## 2020-04-07 NOTE — Telephone Encounter (Signed)
Last labs faxed to the [place] attention Dr Bethel Born, at the patient's request.

## 2020-04-24 DIAGNOSIS — I483 Typical atrial flutter: Secondary | ICD-10-CM

## 2020-04-24 HISTORY — DX: Typical atrial flutter: I48.3

## 2020-05-24 ENCOUNTER — Encounter: Payer: Self-pay | Admitting: Internal Medicine

## 2020-05-24 NOTE — Progress Notes (Signed)
Annual  Screening/Preventative Visit  & Comprehensive Evaluation & Examination      This very nice 60 y.o. single WM presents for a Screening /Preventative Visit & comprehensive evaluation and management of multiple medical co-morbidities.  Patient has been followed for HTN, HLD, T2_NIDDM, Hypothyroidism, Testosterone  and Vitamin D Deficiency. Patient also has Adult ADD treated with Dexadrine with improved focus & concentration.      HTN predates circa 2014. Patient's BP has been controlled at home.  Today's BP is at goal - 118/80. Patient denies any cardiac symptoms as chest pain, palpitations, shortness of breath, dizziness or ankle swelling.      Patient's hyperlipidemia is controlled with diet. Last lipids were at goal albeit slightly elevated Trig's:  Lab Results  Component Value Date   CHOL 110 02/16/2020   HDL 28 (L) 02/16/2020   LDLCALC 56 02/16/2020   TRIG 187 (H) 02/16/2020   CHOLHDL 3.9 02/16/2020        Patient has hx/o T2_NIDDM (2013) and patient denies reactive hypoglycemic symptoms, visual blurring, diabetic polys or paresthesias. Last A1c was not at goal:   Lab Results  Component Value Date   HGBA1C 6.4 (H) 02/16/2020                                            Patient  has been on Thyroid Replacement since he was dx'd Hypothyroid in 2015 . Patient has Testosterone Deficiency & is on Depo-Testosterone with improved stamina & sense of well being.     Finally, patient has history of Vitamin D Deficiency ("33"/2013) and last vitamin D was at goal:   Lab Results  Component Value Date   VD25OH 84 11/13/2019    Current Outpatient Medications on File Prior to Visit  Medication Sig   Ascorbic Acid  500 mg Takes 2 tabs daily   cetirizine 10 MG tablet Take  daily.   VITAMIN D 5000 UNITS  Take daily.   cyclobenzaprine10 MG  Take  daily as needed    DEXEDRINE  15 MG Take  daily as needed.   ferrous sulfate 325 (65 FE) MG  Take daily with breakfast.    levothyroxine 50 MCG tablet TAKE 1 TABLET DAILY    losartan  50 MG tablet Take  daily.    MAGNESIA 50 mg cap Take 1 capsule  daily.    metFORMIN-XR 500 MG  Take 1 tablet  3  times daily with meals.   Omega-3 1400 MG CAPS Take 2 capsules  daily.   tamsulosin  0.4 MG CAPS  Take  daily.   testosterone cypio 200 MG/ML injec USE 2 ML IM EVERY 2 WEEKS    Zinc 50 MG TABS Take daily   vitamin C 500 MG tablet Take 2,000 mg daily.    No Known Allergies  Past Medical History:  Diagnosis Date   DDD (degenerative disc disease)    Depression 10/09/2013   Diabetes mellitus without complication (HCC)    GERD (gastroesophageal reflux disease)    Hyperlipidemia    Hypertension    Hypogonadism male    Vitamin D deficiency    Health Maintenance  Topic Date Due   INFLUENZA VACCINE  11/23/2019   COVID-19 Vaccine (3 - Booster) 01/24/2020   FOOT EXAM  05/03/2020   HEMOGLOBIN A1C  08/16/2020   OPHTHALMOLOGY EXAM  02/01/2021   TETANUS/TDAP  07/18/2023  COLONOSCOPY  08/28/2023   PNEUMOCOCCAL POLYSACCHARIDE VACCINE AGE 51-64 HIGH RISK  Completed   Hepatitis C Screening  Completed   HIV Screening  Completed   Immunization History  Administered Date(s) Administered   DTaP 04/04/2001, 04/24/2008   Influenza Inj Mdck Quad  02/21/2017   Influenza Whole 01/09/2013   Influenza,inj,quad 02/17/2016   Moderna Sars-Covid-2 Vacc 07/04/2019, 07/25/2019   PPD Test 10/09/2013, 10/13/2014, 11/17/2015, 02/21/2017, 04/02/2018, 05/07/2019   Pneumococcal Polysaccharide-23 02/21/2017   Tdap 07/17/2013   Last Colon - 08/27/2013 - Dr Delfin Edis - recc 78 yr  F/u due Oct 2025  Past Surgical History:  Procedure  Date   ASD REPAIR  1971   open heart   CERVICAL FUSION  1991   ESOPHAGOGASTRODUODENOSCOPY  [phone]   duodenal ulcer   LUMBAR DISC SURGERY     LUMBAR FUSION  2001   LUMBAR LAMINECTOMY  7/97   Family History  Problem Relation Age of Onset    Hypertension Mother    Stroke Mother    Cancer Mother        ovarian   Alcohol abuse Father    Hypertension Father    Clotting disorder Father        antiphospholipid syndrome    Diabetes Maternal Uncle    Cancer Maternal Grandmother        lung   Cancer Paternal Grandfather        lung   Colon cancer Neg Hx    Stomach cancer Neg Hx    Social History   Socioeconomic History   Marital status: Single   Number of children: None  Occupational History     Tobacco Use   Smoking status: Current Every Day Smoker    Packs/day: 1.00    Years: 41.00    Pack years: 41.00    Types: Cigarettes    Start date: 1980   Smokeless tobacco: Never Used   Tobacco comment: Currently down to smoking 0.5 pack   Substance and Sexual Activity   Alcohol use: Yes    Alcohol/week: 4.0 standard drinks    Types: 4 Standard drinks or equivalent per week   Drug use: No   Sexual activity: Not on file    ROS Constitutional: Denies fever, chills, weight loss/gain, headaches, insomnia,  night sweats or change in appetite. Does c/o fatigue. Eyes: Denies redness, blurred vision, diplopia, discharge, itchy or watery eyes.  ENT: Denies discharge, congestion, post nasal drip, epistaxis, sore throat, earache, hearing loss, dental pain, Tinnitus, Vertigo, Sinus pain or snoring.  Cardio: Denies chest pain, palpitations, irregular heartbeat, syncope, dyspnea, diaphoresis, orthopnea, PND, claudication or edema Respiratory: denies cough, dyspnea, DOE, pleurisy, hoarseness, laryngitis or wheezing.  Gastrointestinal: Denies dysphagia, heartburn, reflux, water brash, pain, cramps, nausea, vomiting, bloating, diarrhea, constipation, hematemesis, melena, hematochezia, jaundice or hemorrhoids Genitourinary: Denies dysuria, frequency, urgency, nocturia, hesitancy, discharge, hematuria or flank pain Musculoskeletal: Denies arthralgia, myalgia, stiffness, Jt. Swelling, pain, limp or strain/sprain. Denies  Falls. Skin: Denies puritis, rash, hives, warts, acne, eczema or change in skin lesion Neuro: No weakness, tremor, incoordination, spasms, paresthesia or pain Psychiatric: Denies confusion, memory loss or sensory loss. Denies Depression. Endocrine: Denies change in weight, skin, hair change, nocturia, and paresthesia, diabetic polys, visual blurring or hyper / hypo glycemic episodes.  Heme/Lymph: No excessive bleeding, bruising or enlarged lymph nodes.  Physical Exam  BP 118/80    Pulse 82    Temp (!) 97 F (36.1 C)    Resp 16    Ht 5'  10" (1.778 m)    Wt 192 lb 3.2 oz (87.2 kg)    SpO2 97%    BMI 27.58 kg/m   General Appearance: Well nourished and well groomed and in no apparent distress.  Eyes: PERRLA, EOMs, conjunctiva no swelling or erythema, normal fundi and vessels. Sinuses: No frontal/maxillary tenderness ENT/Mouth: EACs patent / TMs  nl. Nares clear without erythema, swelling, mucoid exudates. Oral hygiene is good. No erythema, swelling, or exudate. Tongue normal, non-obstructing. Tonsils not swollen or erythematous. Hearing normal.  Neck: Supple, thyroid not palpable. No bruits, nodes or JVD. Respiratory: Respiratory effort normal.  BS equal and clear bilateral without rales, rhonci, wheezing or stridor. Cardio: Heart sounds are normal with regular rate and rhythm and no murmurs, rubs or gallops. Peripheral pulses are normal and equal bilaterally without edema. No aortic or femoral bruits. Chest: symmetric with normal excursions and percussion.  Abdomen: Soft, with Nl bowel sounds. Nontender, no guarding, rebound, hernias, masses, or organomegaly.  Lymphatics: Non tender without lymphadenopathy.  Musculoskeletal: Full ROM all peripheral extremities, joint stability, 5/5 strength, and normal gait. Skin: Warm and dry without rashes, lesions, cyanosis, clubbing or  ecchymosis.  Neuro: Cranial nerves intact, reflexes equal bilaterally. Normal muscle tone, no cerebellar symptoms.  Sensation intact.  Pysch: Alert and oriented X 3 with normal affect, insight and judgment appropriate.   Assessment and Plan  1. Annual Preventative/Screening Exam    2. Essential hypertension  - EKG 12-Lead - [place], RETROPERITNL ABD,  LTD - Urinalysis, Routine w reflex microscopic - Microalbumin / creatinine urine ratio - CBC with Differential/Platelet - COMPLETE METABOLIC PANEL WITH GFR - Magnesium - TSH  3. Hyperlipidemia associated with type 2 diabetes mellitus ([place])  - EKG 12-Lead - [place], RETROPERITNL ABD,  LTD - Lipid panel - TSH  4. Type 2 diabetes mellitus with stage 2 chronic kidney disease, without long-term current use of insulin (HCC)  - EKG 12-Lead - [place], RETROPERITNL ABD,  LTD - Urinalysis, Routine w reflex microscopic - Microalbumin / creatinine urine ratio - HM DIABETES FOOT EXAM - LOW EXTREMITY NEUR EXAM DOCUM - Hemoglobin A1c - Insulin, random  5. Vitamin D deficiency  - VITAMIN D 25 Hydroxy   6. Hypothyroidism  - TSH  7. 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 Screening for colorectal cancer  - POC Hemoccult Bld/Stl   10. Screening examination for pulmonary tuberculosis  - TB Skin Test  11. Screening for ischemic heart disease  - EKG 12-Lead  12. FHx: heart disease  - EKG 12-Lead - [place], RETROPERITNL ABD,  LTD  13. Smoker  - EKG 12-Lead - [place], RETROPERITNL ABD,  LTD  14. Screening for AAA (aortic abdominal aneurysm)  - [place], RETROPERITNL ABD,  LTD  15. Fatigue, unspecified type  - Iron,Total/Total Iron Binding Cap - Vitamin B12 - CBC with Differential/Platelet - TSH  16. Medication management  - Urinalysis, Routine w reflex microscopic - Microalbumin / creatinine urine ratio - CBC with Differential/Platelet - COMPLETE METABOLIC PANEL WITH GFR - Magnesium - Lipid panel - TSH - Hemoglobin A1c - Insulin, random - VITAMIN D 25 Hydroxy   17. Prostate  cancer screening  - PSA        Patient was counseled in prudent diet, weight control to achieve/maintain BMI less than 25, BP monitoring, regular exercise and medications as discussed.  Discussed med effects and SE's. Routine screening labs and tests as requested with regular follow-up as  recommended. Over 40 minutes of exam, counseling, chart review and high complex critical decision making was performed   Kirtland Bouchard, MD

## 2020-05-24 NOTE — Patient Instructions (Signed)

## 2020-05-26 ENCOUNTER — Other Ambulatory Visit: Payer: Self-pay

## 2020-05-26 ENCOUNTER — Ambulatory Visit (INDEPENDENT_AMBULATORY_CARE_PROVIDER_SITE_OTHER): Payer: 59 | Admitting: Internal Medicine

## 2020-05-26 VITALS — BP 118/80 | HR 82 | Temp 97.0°F | Resp 16 | Ht 70.0 in | Wt 192.2 lb

## 2020-05-26 DIAGNOSIS — G8929 Other chronic pain: Secondary | ICD-10-CM

## 2020-05-26 DIAGNOSIS — I10 Essential (primary) hypertension: Secondary | ICD-10-CM

## 2020-05-26 DIAGNOSIS — E559 Vitamin D deficiency, unspecified: Secondary | ICD-10-CM

## 2020-05-26 DIAGNOSIS — E039 Hypothyroidism, unspecified: Secondary | ICD-10-CM

## 2020-05-26 DIAGNOSIS — Z0001 Encounter for general adult medical examination with abnormal findings: Secondary | ICD-10-CM

## 2020-05-26 DIAGNOSIS — N182 Chronic kidney disease, stage 2 (mild): Secondary | ICD-10-CM

## 2020-05-26 DIAGNOSIS — Z125 Encounter for screening for malignant neoplasm of prostate: Secondary | ICD-10-CM

## 2020-05-26 DIAGNOSIS — R5383 Other fatigue: Secondary | ICD-10-CM

## 2020-05-26 DIAGNOSIS — Z1211 Encounter for screening for malignant neoplasm of colon: Secondary | ICD-10-CM

## 2020-05-26 DIAGNOSIS — F17200 Nicotine dependence, unspecified, uncomplicated: Secondary | ICD-10-CM

## 2020-05-26 DIAGNOSIS — Z136 Encounter for screening for cardiovascular disorders: Secondary | ICD-10-CM | POA: Diagnosis not present

## 2020-05-26 DIAGNOSIS — Z0000 Encounter for general adult medical examination without abnormal findings: Secondary | ICD-10-CM | POA: Diagnosis not present

## 2020-05-26 DIAGNOSIS — E1169 Type 2 diabetes mellitus with other specified complication: Secondary | ICD-10-CM

## 2020-05-26 DIAGNOSIS — Z8249 Family history of ischemic heart disease and other diseases of the circulatory system: Secondary | ICD-10-CM | POA: Diagnosis not present

## 2020-05-26 DIAGNOSIS — Z111 Encounter for screening for respiratory tuberculosis: Secondary | ICD-10-CM | POA: Diagnosis not present

## 2020-05-26 DIAGNOSIS — E1122 Type 2 diabetes mellitus with diabetic chronic kidney disease: Secondary | ICD-10-CM

## 2020-05-26 DIAGNOSIS — Z79899 Other long term (current) drug therapy: Secondary | ICD-10-CM

## 2020-05-26 DIAGNOSIS — E349 Endocrine disorder, unspecified: Secondary | ICD-10-CM

## 2020-05-27 LAB — IRON, TOTAL/TOTAL IRON BINDING CAP
%SAT: 34 % (calc) (ref 20–48)
Iron: 110 ug/dL (ref 50–180)
TIBC: 323 mcg/dL (calc) (ref 250–425)

## 2020-05-27 LAB — CBC WITH DIFFERENTIAL/PLATELET
Absolute Monocytes: 1027 cells/uL — ABNORMAL HIGH (ref 200–950)
Basophils Absolute: 65 cells/uL (ref 0–200)
Basophils Relative: 0.4 %
Eosinophils Absolute: 82 cells/uL (ref 15–500)
Eosinophils Relative: 0.5 %
HCT: 49.1 % (ref 38.5–50.0)
Hemoglobin: 16.9 g/dL (ref 13.2–17.1)
Lymphs Abs: 4254 cells/uL — ABNORMAL HIGH (ref 850–3900)
MCH: 30.9 pg (ref 27.0–33.0)
MCHC: 34.4 g/dL (ref 32.0–36.0)
MCV: 89.8 fL (ref 80.0–100.0)
MPV: 10.8 fL (ref 7.5–12.5)
Monocytes Relative: 6.3 %
Neutro Abs: 10872 cells/uL — ABNORMAL HIGH (ref 1500–7800)
Neutrophils Relative %: 66.7 %
Platelets: 258 10*3/uL (ref 140–400)
RBC: 5.47 10*6/uL (ref 4.20–5.80)
RDW: 13 % (ref 11.0–15.0)
Total Lymphocyte: 26.1 %
WBC: 16.3 10*3/uL — ABNORMAL HIGH (ref 3.8–10.8)

## 2020-05-27 LAB — COMPLETE METABOLIC PANEL WITH GFR
AG Ratio: 2.3 (calc) (ref 1.0–2.5)
ALT: 37 U/L (ref 9–46)
AST: 18 U/L (ref 10–35)
Albumin: 4.6 g/dL (ref 3.6–5.1)
Alkaline phosphatase (APISO): 66 U/L (ref 35–144)
BUN: 17 mg/dL (ref 7–25)
CO2: 27 mmol/L (ref 20–32)
Calcium: 9.7 mg/dL (ref 8.6–10.3)
Chloride: 102 mmol/L (ref 98–110)
Creat: 0.87 mg/dL (ref 0.70–1.33)
GFR, Est African American: 111 mL/min/{1.73_m2} (ref >=60)
GFR, Est Non African American: 96 mL/min/{1.73_m2} (ref >=60)
Globulin: 2 g/dL (calc) (ref 1.9–3.7)
Glucose, Bld: 95 mg/dL (ref 65–99)
Potassium: 4.4 mmol/L (ref 3.5–5.3)
Sodium: 137 mmol/L (ref 135–146)
Total Bilirubin: 0.3 mg/dL (ref 0.2–1.2)
Total Protein: 6.6 g/dL (ref 6.1–8.1)

## 2020-05-27 LAB — URINALYSIS, ROUTINE W REFLEX MICROSCOPIC
Bilirubin Urine: NEGATIVE
Glucose, UA: NEGATIVE
Hgb urine dipstick: NEGATIVE
Ketones, ur: NEGATIVE
Leukocytes,Ua: NEGATIVE
Nitrite: NEGATIVE
Protein, ur: NEGATIVE
Specific Gravity, Urine: 1.023 (ref 1.001–1.03)
pH: 6 (ref 5.0–8.0)

## 2020-05-27 LAB — VITAMIN B12: Vitamin B-12: 534 pg/mL (ref 200–1100)

## 2020-05-27 LAB — LIPID PANEL
Cholesterol: 100 mg/dL (ref <200)
HDL: 34 mg/dL — ABNORMAL LOW (ref >=40)
LDL Cholesterol (Calc): 42 mg/dL (calc)
Non-HDL Cholesterol (Calc): 66 mg/dL (calc) (ref <130)
Total CHOL/HDL Ratio: 2.9 (calc) (ref <5.0)
Triglycerides: 160 mg/dL — ABNORMAL HIGH (ref <150)

## 2020-05-27 LAB — HEMOGLOBIN A1C
Hgb A1c MFr Bld: 6.8 % of total Hgb — ABNORMAL HIGH (ref <5.7)
Mean Plasma Glucose: 148 mg/dL
eAG (mmol/L): 8.2 mmol/L

## 2020-05-27 LAB — MAGNESIUM: Magnesium: 2 mg/dL (ref 1.5–2.5)

## 2020-05-27 LAB — MICROALBUMIN / CREATININE URINE RATIO
Creatinine, Urine: 118 mg/dL (ref 20–320)
Microalb Creat Ratio: 20 mcg/mg creat (ref <30)
Microalb, Ur: 2.4 mg/dL

## 2020-05-27 LAB — PSA: PSA: 0.66 ng/mL (ref <=4.0)

## 2020-05-27 LAB — TESTOSTERONE: Testosterone: 895 ng/dL — ABNORMAL HIGH (ref 250–827)

## 2020-05-27 LAB — INSULIN, RANDOM: Insulin: 22.7 u[IU]/mL — ABNORMAL HIGH

## 2020-05-27 LAB — VITAMIN D 25 HYDROXY (VIT D DEFICIENCY, FRACTURES): Vit D, 25-Hydroxy: 77 ng/mL (ref 30–100)

## 2020-05-27 LAB — TSH: TSH: 2.53 mIU/L (ref 0.40–4.50)

## 2020-05-27 NOTE — Progress Notes (Signed)
========================================================== -   Test results slightly outside the reference range are not unusual. If there is anything important, I will review this with you,  otherwise it is considered normal test values.  If you have further questions,  please do not hesitate to contact me at the office or via My Chart.  ==========================================================  -  Iron levels & Vitamin B12 level - Both Normal & OK  ========================================================== ==========================================================  -  PSA - very low - Great  ========================================================== ==========================================================  -  Testosterone - Normal / OK  ========================================================== ==========================================================  -  Total Chol = 100 and LDL Chol = 42 - Bo5th excellent  ========================================================== ==========================================================  -  A1c higher - has jumped up to 6.8% - Recommend stricter diety  ========================================================== ==========================================================  -  Vitamin D = 77 - Excellent - Please keep dose same   ========================================================== ==========================================================  -  All Else - CBC - Kidneys -  U/A  - Electrolytes - Liver - Magnesium & Thyroid    - all  Normal / OK ===========================================================  ==========================================================

## 2020-06-08 ENCOUNTER — Other Ambulatory Visit: Payer: Self-pay

## 2020-06-08 DIAGNOSIS — Z1211 Encounter for screening for malignant neoplasm of colon: Secondary | ICD-10-CM

## 2020-06-08 DIAGNOSIS — Z1212 Encounter for screening for malignant neoplasm of rectum: Secondary | ICD-10-CM | POA: Diagnosis not present

## 2020-06-08 LAB — POC HEMOCCULT BLD/STL (HOME/3-CARD/SCREEN)
Card #2 Fecal Occult Blod, POC: NEGATIVE
Card #3 Fecal Occult Blood, POC: NEGATIVE
Fecal Occult Blood, POC: NEGATIVE

## 2020-07-13 ENCOUNTER — Other Ambulatory Visit: Payer: Self-pay | Admitting: Adult Health Nurse Practitioner

## 2020-07-13 DIAGNOSIS — M5442 Lumbago with sciatica, left side: Secondary | ICD-10-CM

## 2020-07-13 DIAGNOSIS — G8929 Other chronic pain: Secondary | ICD-10-CM

## 2020-08-30 NOTE — Progress Notes (Signed)
` FOLLOW UP  Assessment and Plan:   Hypertension Fairly controlled with current medications  Monitor blood pressure at home; patient to call if consistently greater than 130/80 Continue DASH diet.   Reminder to go to the ER if any CP, SOB, nausea, dizziness, severe HA, changes vision/speech, left arm numbness and tingling and jaw pain.   Hyperlipidemia associated with T2DM ([place]) Currently at LDL goal (<70) by lifestyle, omega 3  Discussed consider low dose statin for risk reduction  Continue low cholesterol diet and exercise.  Check lipid panel.   Diabetes with diabetic chronic kidney disease ([place]) Continue medication: metformin 3 tabs daily Continue diet and exercise, progress with weight loss, goal to get off diabetic medications Low glycemic index diet reviewed today  Perform daily foot/skin check, notify office of any concerning changes.  Check A1C  CKD II associated with T2DM ([facility]) Increase fluids, avoid NSAIDS, monitor sugars, will monitor  Obesity with co morbidities Long discussion about weight loss, diet, and exercise Recommended diet heavy in fruits and veggies and low in animal meats, cheeses, and dairy products, appropriate calorie intake Discussed ideal weight for height and initial weight goal (175 lb)  Patient will work on continue to make sustainable small good choices Motivated to reverse diabetes Will follow up in 3 months  Vitamin D Def At goal at last chec; continue supplementation Defer vit D level to CPE  Hypogonadism - continue to monitor, states medication is helping with symptoms of low T.   Hypothyroidism continue medications the same: reminded to take on an empty stomach 30-24mins before food.  check TSH level  Tobacco use Discussed risks associated with tobacco use and advised to reduce or quit Patient is ready to do so this summer, plans to do nicotine patches, already has Will follow up at the next visit -lung cancer screening with low  dose CT discussed as recommended by guidelines based on age, number of pack year history.  Discussed risks of screening including but not limited to false positives on xray, further testing or consultation with specialist, and possible false negative CT as well. Understanding expressed and wishes to hold of with CT testing until later this year.   ADD Continue medications Helps with focus, no AE's; uses rarely The patient was counseled on the addictive nature of the medication and was encouraged to take drug holidays when not needed.    Continue diet and meds as discussed. Further disposition pending results of labs. Discussed med's effects and SE's.   Over 30 minutes of exam, counseling, chart review, and critical decision making was performed.   Future Appointments  Date Time Provider [place]  12/01/2020  2:30 PM [name], MD [facility] None  06/06/2021  3:00 PM [name], MD [facility] None    ----------------------------------------------------------------------------------------------------------------------  HPI 60 y.o. male  presents for 3 month follow up on hypertension, cholesterol, T2 diabetes, weight, hypothyroid, tobacco use, hypogonadism on testosterone supplement, ADD and vitamin D deficiency.    he currently continues to smoke 1/2 pack (down from 1 pack average since 1980, does have 30+ pack year history); discussed risks associated with smoking, patient is ready to quit, has nicotine patches, hasn't started yet, planning to quit this summer but not ready to set a date.   He has COPD per imaging, CXR. Denies coughing, dyspnea, wheezing. Discussed low dose CT, would consider but wants to hold off until CPE.    Patient is on an ADD medication,  15 mg dextroamphetamine PRN; he takes  very rarely - typically 1-2 occasions a month. He states that the medication is helping and he denies any adverse reactions.  BMI is Body mass index is 27.12 kg/m., he has  been working on diet but not much exercise. He does plan to start biking again. He has cut out sweet tea, he does drink some soda, does have coffee with sugar. He has made small adjustments. Weight goal 175 lb.  Wt Readings from Last 3 Encounters:  08/31/20 189 lb (85.7 kg)  05/26/20 192 lb 3.2 oz (87.2 kg)  02/16/20 183 lb (83 kg)   His blood pressure has been controlled at home, today their BP is BP: 138/82 On losartan for renal benefits with T2DM.   He does workout. He denies chest pain, shortness of breath, dizziness.   He is not on cholesterol medication, only taking fish oil  and denies myalgias. His LDL cholesterol is at goal, trigs on non-fasting labs are mildly elevated. The cholesterol last visit was:   Lab Results  Component Value Date   CHOL 100 05/26/2020   HDL 34 (L) 05/26/2020   LDLCALC 42 05/26/2020   TRIG 160 (H) 05/26/2020   CHOLHDL 2.9 05/26/2020    He has been working on diet and exercise for T2DM (controlled in normal range on metformin -1 tab TID) and denies increased appetite, nausea, paresthesia of the feet, polydipsia, polyuria, visual disturbances and vomiting.  He admits hasn't checked glucose in a while-  Last A1C in the office was:  Lab Results  Component Value Date   HGBA1C 6.8 (H) 05/26/2020    He has CKD II associated with T2DM monitored at this office:  Lab Results  Component Value Date   GFRNONAA 96 05/26/2020   He is on thyroid medication. His medication was not changed last visit.   Lab Results  Component Value Date   TSH 2.53 05/26/2020   Patient is on Vitamin D supplement and at goal at last check:    Lab Results  Component Value Date   VD25OH 77 05/26/2020     He has history of testosterone deficiency and is on testosterone replacement, taking 400 mg every 2 weeks, last shot 08/29/2020. He states that the testosterone helps with his energy, libido, muscle mass. Lab Results  Component Value Date   TESTOSTERONE 895 (H) 05/26/2020      Current Medications:  Current Outpatient Medications on File Prior to Visit  Medication Sig  . Ascorbic Acid (VITAMIN C/NATURAL ROSE HIPS PO) Take 500 mg by mouth daily. Takes 2 tabs daily  . B-D 3CC LUER-LOK SYR 21GX1" 21G X 1" 3 ML MISC USE AS DIRECTED  . cetirizine (ZYRTEC) 10 MG tablet Take 10 mg by mouth daily.  . Cholecalciferol (VITAMIN D-3) 5000 UNITS TABS Take 5,000 Units by mouth daily.  . cyclobenzaprine (FLEXERIL) 10 MG tablet Take 10 mg by mouth daily as needed for muscle spasms. Takes prn  . dextroamphetamine (DEXEDRINE SPANSULE) 15 MG 24 hr capsule Take 15 mg by mouth daily as needed.  . ferrous sulfate 325 (65 FE) MG tablet Take 325 mg by mouth daily with breakfast.  . levothyroxine (SYNTHROID, LEVOTHROID) 50 MCG tablet TAKE 1 TABLET BY MOUTH ONCE DAILY BEFORE BREAKFAST  . losartan (COZAAR) 50 MG tablet Take 50 mg by mouth daily.   . meloxicam (MOBIC) 15 MG tablet 1 DAILY WITH FOOD X2WKS, CAN TAKE W/ TYLENOL, NO ALEVE, IBURPOFEN. THEN AS NEEDED DAILY FOR PAIN  . metFORMIN (GLUCOPHAGE-XR) 500 MG 24 hr  tablet Take 1 tablet (500 mg total) by mouth 3 (three) times daily with meals.  . Omega-3 1400 MG CAPS Take 2 capsules by mouth daily.  . ONE TOUCH ULTRA TEST test strip TEST BLOOD SUGAR 3 TIMES A DAY  . ONETOUCH DELICA LANCETS 32K MISC USE TO CHECK SUGAR DAILY  . tamsulosin (FLOMAX) 0.4 MG CAPS capsule Take 0.4 mg by mouth daily.  Marland Kitchen testosterone cypionate (DEPOTESTOSTERONE CYPIONATE) 200 MG/ML injection USE 2ML INTRAMUSCULARLY EVERY 2 WEEKS AS DIRECTED  . Zinc 50 MG TABS Take by mouth.  . Magnesium Hydroxide (MAGNESIA PO) Take 1 capsule by mouth daily. 50 mg cap (Patient not taking: No sig reported)   No current facility-administered medications on file prior to visit.     Allergies: Not on File   Medical History:  Past Medical History:  Diagnosis Date  . DDD (degenerative disc disease)   . Depression 10/09/2013  . Diabetes mellitus without complication ([place])   .  GERD (gastroesophageal reflux disease)   . Hyperlipidemia   . Hypertension   . Hypogonadism male   . Vitamin D deficiency    Family history- Reviewed and unchanged Social history- Reviewed and unchanged   Review of Systems:  Review of Systems  Constitutional: Negative for malaise/fatigue and weight loss.  HENT: Negative for hearing loss and tinnitus.   Eyes: Negative for blurred vision and double vision.  Respiratory: Negative for cough, shortness of breath and wheezing.   Cardiovascular: Negative for chest pain, palpitations, orthopnea, claudication and leg swelling.  Gastrointestinal: Negative for abdominal pain, blood in stool, constipation, diarrhea, heartburn, melena, nausea and vomiting.  Genitourinary: Negative.   Musculoskeletal: Negative for joint pain and myalgias.  Skin: Negative for rash.  Neurological: Negative for dizziness, tingling, sensory change, weakness and headaches.  Endo/Heme/Allergies: Negative for polydipsia.  Psychiatric/Behavioral: Negative.   All other systems reviewed and are negative.    Physical Exam: BP 138/82   Pulse 71   Temp (!) 96.8 F (36 C)   Wt 189 lb (85.7 kg)   SpO2 97%   BMI 27.12 kg/m  Wt Readings from Last 3 Encounters:  08/31/20 189 lb (85.7 kg)  05/26/20 192 lb 3.2 oz (87.2 kg)  02/16/20 183 lb (83 kg)   General Appearance: Well nourished, in no apparent distress. Eyes: PERRLA, EOMs, conjunctiva no swelling or erythema Sinuses: No Frontal/maxillary tenderness ENT/Mouth: Ext aud canals clear, TMs without erythema, bulging. No erythema, swelling, or exudate on post pharynx.  Tonsils not swollen or erythematous. Hearing normal.  Neck: Supple, thyroid normal.  Respiratory: Respiratory effort normal, BS equal bilaterally without rales, rhonchi, wheezing or stridor.  Cardio: RRR with no MRGs. Brisk peripheral pulses without edema.  Abdomen: Soft, + BS.  Non tender, no guarding, rebound, hernias, masses. Lymphatics: Non tender  without lymphadenopathy.  Musculoskeletal: Full ROM, 5/5 strength, Normal gait Skin: Warm, dry without rashes, lesions, ecchymosis.  Neuro: Cranial nerves intact. No cerebellar symptoms.  Psych: Awake and oriented X 3, normal affect, Insight and Judgment appropriate.    Izora Ribas, NP 2:43 PM [facility]

## 2020-08-31 ENCOUNTER — Ambulatory Visit: Payer: 59 | Admitting: Adult Health

## 2020-08-31 ENCOUNTER — Encounter: Payer: Self-pay | Admitting: Adult Health

## 2020-08-31 ENCOUNTER — Other Ambulatory Visit: Payer: Self-pay

## 2020-08-31 VITALS — BP 138/82 | HR 71 | Temp 96.8°F | Wt 189.0 lb

## 2020-08-31 DIAGNOSIS — E1169 Type 2 diabetes mellitus with other specified complication: Secondary | ICD-10-CM

## 2020-08-31 DIAGNOSIS — E663 Overweight: Secondary | ICD-10-CM

## 2020-08-31 DIAGNOSIS — E1129 Type 2 diabetes mellitus with other diabetic kidney complication: Secondary | ICD-10-CM

## 2020-08-31 DIAGNOSIS — J449 Chronic obstructive pulmonary disease, unspecified: Secondary | ICD-10-CM | POA: Diagnosis not present

## 2020-08-31 DIAGNOSIS — E1122 Type 2 diabetes mellitus with diabetic chronic kidney disease: Secondary | ICD-10-CM

## 2020-08-31 DIAGNOSIS — N182 Chronic kidney disease, stage 2 (mild): Secondary | ICD-10-CM

## 2020-08-31 DIAGNOSIS — I10 Essential (primary) hypertension: Secondary | ICD-10-CM

## 2020-08-31 DIAGNOSIS — E349 Endocrine disorder, unspecified: Secondary | ICD-10-CM

## 2020-08-31 DIAGNOSIS — E039 Hypothyroidism, unspecified: Secondary | ICD-10-CM

## 2020-08-31 DIAGNOSIS — F988 Other specified behavioral and emotional disorders with onset usually occurring in childhood and adolescence: Secondary | ICD-10-CM

## 2020-08-31 DIAGNOSIS — Z79899 Other long term (current) drug therapy: Secondary | ICD-10-CM

## 2020-08-31 DIAGNOSIS — E785 Hyperlipidemia, unspecified: Secondary | ICD-10-CM

## 2020-08-31 DIAGNOSIS — F17200 Nicotine dependence, unspecified, uncomplicated: Secondary | ICD-10-CM

## 2020-08-31 DIAGNOSIS — E559 Vitamin D deficiency, unspecified: Secondary | ICD-10-CM

## 2020-08-31 NOTE — Patient Instructions (Addendum)
Goals    . HEMOGLOBIN A1C < 5.7    . LDL CALC < 70    . Quit Smoking    . Weight (lb) < 175 lb (79.4 kg)         SMOKING CESSATION  American cancer society  23762831517 for more information or for a free program for smoking cessation help.   You can call QUIT SMART 1-800-QUIT-NOW for free nicotine patches or replacement therapy- if they are out- keep calling  [place] [facility] Can call for smoking cessation classes, [phone]  If you have a smart phone, please look up Smoke Free app, this will help you stay on track and give you information about money you have saved, life that you have gained back and a ton of more information.     ADVANTAGES OF QUITTING SMOKING  Within 20 minutes, blood pressure decreases. Your pulse is at normal level.  After 8 hours, carbon monoxide levels in the blood return to normal. Your oxygen level increases.  After 24 hours, the chance of having a heart attack starts to decrease. Your breath, hair, and body stop smelling like smoke.  After 48 hours, damaged nerve endings begin to recover. Your sense of taste and smell improve.  After 72 hours, the body is virtually free of nicotine. Your bronchial tubes relax and breathing becomes easier.  After 2 to 12 weeks, lungs can hold more air. Exercise becomes easier and circulation improves.  After 1 year, the risk of coronary heart disease is cut in half.  After 5 years, the risk of stroke falls to the same as a nonsmoker.  After 10 years, the risk of lung cancer is cut in half and the risk of other cancers decreases significantly.  After 15 years, the risk of coronary heart disease drops, usually to the level of a nonsmoker.  You will have extra money to spend on things other than cigarettes.     Lung Cancer Screening A lung cancer screening is a test that checks for lung cancer. Lung cancer screening is done to look for lung cancer in its very early stages when you are not  likely to have any symptoms and before it spreads beyond the lung, making it harder to treat. Finding cancer early improves the chances of successful treatment. It may save your life. Who should have screening? You should be screened for lung cancer if all of these apply:  You currently smoke or you have quit smoking within the past 15 years.  You are 39-68 years old. Screening may be recommended up to age 25 depending on your overall health and other factors.  You are in good general health.  You have a smoking history of 1 pack of cigarettes a day for 20 years or 2 packs a day for 10 years. Screening may also be recommended if you are at high risk for the disease. You may be at high risk if:  You have a family history of lung cancer.  You have been exposed to asbestos or radon.  You have chronic obstructive pulmonary disease (COPD). How is screening done? The recommended screening test is a low-dose computed tomography (LDCT) scan. This scan takes detailed images of the lungs. This allows a health care provider to look for abnormal cells. If you are at risk for lung cancer, it is recommended that you get screened once a year. Talk to your health care provider about the risks, benefits, and limitations of screening.   What  are the benefits of screening? Screening can find lung cancer early, before symptoms start and before it has spread outside of the lungs. The chances of curing lung cancer are greater if the cancer is diagnosed early. What are the risks of screening?  The screening may show lung cancer when no cancer is present (false-positive result).  The screening may not find lung cancer when it is present.  The person gets exposed to radiation. How can I lower my risk of lung cancer? Make these lifestyle changes to lower your risk of developing lung cancer:  Do not use any products that contain nicotine or tobacco, such as cigarettes, e-cigarettes, and chewing tobacco. If you  need help quitting, ask your health care provider.  Avoid secondhand smoke.  Avoid exposure to radiation.  Avoid exposure to radon gas. Have your home checked for radon regularly.  Avoid things that cause cancer (carcinogens).  Avoid living or working in places with high air pollution. Questions to ask your health care provider  Am I eligible for lung cancer screening?  Does my health insurance cover the cost of lung cancer screening?  What happens if the lung cancer screening shows something of concern?  How soon will I have results from my lung cancer screening?  Is there anything that I need to do to prepare for my lung cancer screening?  What happens if I decide not to have lung cancer screening? Where to find more information Ask your health care provider about the risks and benefits of screening. More information and resources are available from these organizations:  [place] (ACS): www.cancer.org  American Lung Association: www.lung.org Contact a health care provider if:  You start to show symptoms of lung cancer, including: ? Coughing that will not go away. ? Making whistling sounds when you breathe (wheezing). ? Chest pain. ? Coughing up blood. ? Shortness of breath. ? Weight loss that cannot be explained. ? Constant tiredness (fatigue). ? Hoarse voice. Summary  Lung cancer screening may find lung cancer before symptoms appear. Finding cancer early improves the chances of successful treatment. It may save your life.  The recommended screening test is a low-dose computed tomography (LDCT) scan that looks for abnormal cells in the lungs. If you are at risk for lung cancer, it is recommended that you get screened once a year.  You can make lifestyle changes to lower your risk of lung cancer.  Ask your health care provider about the risks and benefits of screening. This information is not intended to replace advice given to you by your health care  provider. Make sure you discuss any questions you have with your health care provider. Document Revised: 09/01/2019 Document Reviewed: 04/08/2019 Elsevier Patient Education  2021 [employer].

## 2020-09-01 ENCOUNTER — Other Ambulatory Visit: Payer: Self-pay | Admitting: Adult Health

## 2020-09-02 LAB — LIPID PANEL
Cholesterol: 103 mg/dL (ref <200)
HDL: 32 mg/dL — ABNORMAL LOW (ref >=40)
LDL Cholesterol (Calc): 50 mg/dL (calc)
Non-HDL Cholesterol (Calc): 71 mg/dL (calc) (ref <130)
Total CHOL/HDL Ratio: 3.2 (calc) (ref <5.0)
Triglycerides: 131 mg/dL (ref <150)

## 2020-09-02 LAB — TEST AUTHORIZATION

## 2020-09-02 LAB — COMPLETE METABOLIC PANEL WITH GFR
AG Ratio: 2.1 (calc) (ref 1.0–2.5)
ALT: 25 U/L (ref 9–46)
AST: 18 U/L (ref 10–35)
Albumin: 4.7 g/dL (ref 3.6–5.1)
Alkaline phosphatase (APISO): 69 U/L (ref 35–144)
BUN: 19 mg/dL (ref 7–25)
CO2: 23 mmol/L (ref 20–32)
Calcium: 10.1 mg/dL (ref 8.6–10.3)
Chloride: 101 mmol/L (ref 98–110)
Creat: 0.96 mg/dL (ref 0.70–1.33)
GFR, Est African American: 101 mL/min/{1.73_m2} (ref >=60)
GFR, Est Non African American: 87 mL/min/{1.73_m2} (ref >=60)
Globulin: 2.2 g/dL (calc) (ref 1.9–3.7)
Glucose, Bld: 86 mg/dL (ref 65–99)
Potassium: 4.3 mmol/L (ref 3.5–5.3)
Sodium: 137 mmol/L (ref 135–146)
Total Bilirubin: 0.4 mg/dL (ref 0.2–1.2)
Total Protein: 6.9 g/dL (ref 6.1–8.1)

## 2020-09-02 LAB — HEMOGLOBIN A1C
Hgb A1c MFr Bld: 5.7 % of total Hgb — ABNORMAL HIGH (ref <5.7)
Mean Plasma Glucose: 117 mg/dL
eAG (mmol/L): 6.5 mmol/L

## 2020-09-02 LAB — TSH: TSH: 2.62 mIU/L (ref 0.40–4.50)

## 2020-09-02 LAB — CBC WITH DIFFERENTIAL/PLATELET
Absolute Monocytes: 900 cells/uL (ref 200–950)
Basophils Absolute: 63 cells/uL (ref 0–200)
Basophils Relative: 0.5 %
Eosinophils Absolute: 88 cells/uL (ref 15–500)
Eosinophils Relative: 0.7 %
HCT: 54.2 % — ABNORMAL HIGH (ref 38.5–50.0)
Hemoglobin: 17.9 g/dL — ABNORMAL HIGH (ref 13.2–17.1)
Lymphs Abs: 3213 cells/uL (ref 850–3900)
MCH: 30.3 pg (ref 27.0–33.0)
MCHC: 33 g/dL (ref 32.0–36.0)
MCV: 91.9 fL (ref 80.0–100.0)
MPV: 11.4 fL (ref 7.5–12.5)
Monocytes Relative: 7.2 %
Neutro Abs: 8238 cells/uL — ABNORMAL HIGH (ref 1500–7800)
Neutrophils Relative %: 65.9 %
Platelets: 219 10*3/uL (ref 140–400)
RBC: 5.9 10*6/uL — ABNORMAL HIGH (ref 4.20–5.80)
RDW: 12.7 % (ref 11.0–15.0)
Total Lymphocyte: 25.7 %
WBC: 12.5 10*3/uL — ABNORMAL HIGH (ref 3.8–10.8)

## 2020-09-02 LAB — MAGNESIUM: Magnesium: 1.9 mg/dL (ref 1.5–2.5)

## 2020-09-02 LAB — PATHOLOGIST SMEAR REVIEW

## 2020-10-24 ENCOUNTER — Other Ambulatory Visit: Payer: Self-pay | Admitting: Adult Health

## 2020-10-24 DIAGNOSIS — G8929 Other chronic pain: Secondary | ICD-10-CM

## 2020-11-16 ENCOUNTER — Other Ambulatory Visit: Payer: Self-pay | Admitting: Neurological Surgery

## 2020-11-22 NOTE — Pre-Procedure Instructions (Signed)
Surgical Instructions    Your procedure is scheduled on Monday 12/06/20.   Report to [facility] Main Entrance "A" at 08:40 A.M., then check in with the Admitting office.  Call this number if you have problems the morning of surgery:  [phone]   If you have any questions prior to your surgery date call [phone]: Open Monday-Friday 8am-4pm    Remember:  Do not eat after midnight the night before your surgery  You may drink clear liquids until 07:30 A.M. the morning of your surgery.   Clear liquids allowed are: Water, Non-Citrus Juices (without pulp), Carbonated Beverages, Clear Tea, Black Coffee Only, and Gatorade    Take these medicines the morning of surgery with A SIP OF WATER   levothyroxine (SYNTHROID, LEVOTHROID)   tamsulosin (FLOMAX)    Take these medicines if needed:   cyclobenzaprine (FLEXERIL)  traMADol (ULTRAM)  As of today, STOP taking any Aspirin (unless otherwise instructed by your surgeon) dextroamphetamine (DEXEDRINE SPANSULE), meloxicam (MOBIC), Aleve, Naproxen, Ibuprofen, Motrin, Advil, Goody's, BC's, all herbal medications, fish oil, and all vitamins.  Please follow your surgeon's instructions on when to stop taking Aspirin. If you have not received instructions then you need to contact your surgeon's office.   WHAT DO I DO ABOUT MY DIABETES MEDICATION?   Do not take oral diabetes medicines (pills) the morning of surgery.  DO NOT TAKE metFORMIN (GLUCOPHAGE-XR) the morning of surgery.   HOW TO MANAGE YOUR DIABETES BEFORE AND AFTER SURGERY  Why is it important to control my blood sugar before and after surgery? Improving blood sugar levels before and after surgery helps healing and can limit problems. A way of improving blood sugar control is eating a healthy diet by:  Eating less sugar and carbohydrates  Increasing activity/exercise  Talking with your doctor about reaching your blood sugar goals High blood sugars (greater than 180 mg/dL) can raise  your risk of infections and slow your recovery, so you will need to focus on controlling your diabetes during the weeks before surgery. Make sure that the doctor who takes care of your diabetes knows about your planned surgery including the date and location.  How do I manage my blood sugar before surgery? Check your blood sugar at least 4 times a day, starting 2 days before surgery, to make sure that the level is not too high or low.  Check your blood sugar the morning of your surgery when you wake up and every 2 hours until you get to the Short Stay unit.  If your blood sugar is less than 70 mg/dL, you will need to treat for low blood sugar: Do not take insulin. Treat a low blood sugar (less than 70 mg/dL) with  cup of clear juice (cranberry or apple), 4 glucose tablets, OR glucose gel. Recheck blood sugar in 15 minutes after treatment (to make sure it is greater than 70 mg/dL). If your blood sugar is not greater than 70 mg/dL on recheck, call [phone] for further instructions. Report your blood sugar to the short stay nurse when you get to Short Stay.  If you are admitted to the hospital after surgery: Your blood sugar will be checked by the staff and you will probably be given insulin after surgery (instead of oral diabetes medicines) to make sure you have good blood sugar levels. The goal for blood sugar control after surgery is 80-180 mg/dL.  Do NOT Smoke (Tobacco/Vaping) or drink Alcohol 24 hours prior to your procedure.  If you use a CPAP at night, you may bring all equipment for your overnight stay.   Contacts, glasses, piercing's, hearing aid's, dentures or partials may not be worn into surgery, please bring cases for these belongings.    For patients admitted to the hospital, discharge time will be determined by your treatment team.   Patients discharged the day of surgery will not be allowed to drive home, and someone needs to stay with them for 24  hours.  ONLY 1 SUPPORT PERSON MAY BE PRESENT WHILE YOU ARE IN SURGERY. IF YOU ARE TO BE ADMITTED ONCE YOU ARE IN YOUR ROOM YOU WILL BE ALLOWED TWO (2) VISITORS.  Minor children may have two parents present. Special consideration for safety and communication needs will be reviewed on a case by case basis.   Special instructions:   [facility]- Preparing For Surgery  Before surgery, you can play an important role. Because skin is not sterile, your skin needs to be as free of germs as possible. You can reduce the number of germs on your skin by washing with CHG (chlorahexidine gluconate) Soap before surgery.  CHG is an antiseptic cleaner which kills germs and bonds with the skin to continue killing germs even after washing.    Oral Hygiene is also important to reduce your risk of infection.  Remember - BRUSH YOUR TEETH THE MORNING OF SURGERY WITH YOUR REGULAR TOOTHPASTE  Please do not use if you have an allergy to CHG or antibacterial soaps. If your skin becomes reddened/irritated stop using the CHG.  Do not shave (including legs and underarms) for at least 48 hours prior to first CHG shower. It is OK to shave your face.  Please follow these instructions carefully.   Shower the NIGHT BEFORE SURGERY and the MORNING OF SURGERY  If you chose to wash your hair, wash your hair first as usual with your normal shampoo.  After you shampoo, rinse your hair and body thoroughly to remove the shampoo.  Use CHG Soap as you would any other liquid soap. You can apply CHG directly to the skin and wash gently with a scrungie or a clean washcloth.   Apply the CHG Soap to your body ONLY FROM THE NECK DOWN.  Do not use on open wounds or open sores. Avoid contact with your eyes, ears, mouth and genitals (private parts). Wash Face and genitals (private parts)  with your normal soap.   Wash thoroughly, paying special attention to the area where your surgery will be performed.  Thoroughly rinse your body with warm  water from the neck down.  DO NOT shower/wash with your normal soap after using and rinsing off the CHG Soap.  Pat yourself dry with a CLEAN TOWEL.  Wear CLEAN PAJAMAS to bed the night before surgery  Place CLEAN SHEETS on your bed the night before your surgery  DO NOT SLEEP WITH PETS.   Day of Surgery: Shower with CHG soap. Do not wear jewelry, make up, nail polish, gel polish, artificial nails, or any other type of covering on natural nails including finger and toenails. If patients have artificial nails, gel coating, etc. that need to be removed by a nail salon please have this removed prior to surgery. Surgery may need to be canceled/delayed if the surgeon/ anesthesia feels like the patient is unable to be adequately monitored. Do not wear lotions, powders, perfumes/colognes, or deodorant. Do not shave  48 hours prior to surgery.  Men may shave face and neck. Do not bring valuables to the hospital. [facility] is not responsible for any belongings or valuables. Wear Clean/Comfortable clothing the morning of surgery Remember to brush your teeth WITH YOUR REGULAR TOOTHPASTE.   Please read over the following fact sheets that you were given.

## 2020-11-23 ENCOUNTER — Encounter (HOSPITAL_COMMUNITY)
Admission: RE | Admit: 2020-11-23 | Discharge: 2020-11-23 | Disposition: A | Discharge status: Home / Self Care | Payer: 59 | Source: Ambulatory Visit | Attending: Neurological Surgery | Admitting: Neurological Surgery

## 2020-11-23 ENCOUNTER — Encounter (HOSPITAL_COMMUNITY): Payer: Self-pay

## 2020-11-23 ENCOUNTER — Other Ambulatory Visit: Payer: Self-pay

## 2020-11-23 DIAGNOSIS — Z01812 Encounter for preprocedural laboratory examination: Secondary | ICD-10-CM | POA: Diagnosis not present

## 2020-11-23 HISTORY — DX: Hypothyroidism, unspecified: E03.9

## 2020-11-23 HISTORY — DX: Cardiac murmur, unspecified: R01.1

## 2020-11-23 HISTORY — DX: Personal history of urinary calculi: Z87.442

## 2020-11-23 HISTORY — DX: Chronic obstructive pulmonary disease, unspecified: J44.9

## 2020-11-23 LAB — SURGICAL PCR SCREEN
MRSA, PCR: NEGATIVE
Staphylococcus aureus: NEGATIVE

## 2020-11-23 LAB — CBC
HCT: 47.9 % (ref 39.0–52.0)
Hemoglobin: 16.2 g/dL (ref 13.0–17.0)
MCH: 29.9 pg (ref 26.0–34.0)
MCHC: 33.8 g/dL (ref 30.0–36.0)
MCV: 88.4 fL (ref 80.0–100.0)
Platelets: 224 10*3/uL (ref 150–400)
RBC: 5.42 MIL/uL (ref 4.22–5.81)
RDW: 14.6 % (ref 11.5–15.5)
WBC: 12.6 10*3/uL — ABNORMAL HIGH (ref 4.0–10.5)
nRBC: 0 % (ref 0.0–0.2)

## 2020-11-23 LAB — BASIC METABOLIC PANEL
Anion gap: 9 (ref 5–15)
BUN: 19 mg/dL (ref 6–20)
CO2: 24 mmol/L (ref 22–32)
Calcium: 9.4 mg/dL (ref 8.9–10.3)
Chloride: 106 mmol/L (ref 98–111)
Creatinine, Ser: 1.13 mg/dL (ref 0.61–1.24)
GFR, Estimated: >60 mL/min (ref >60)
Glucose, Bld: 102 mg/dL — ABNORMAL HIGH (ref 70–99)
Potassium: 4 mmol/L (ref 3.5–5.1)
Sodium: 139 mmol/L (ref 135–145)

## 2020-11-23 LAB — GLUCOSE, CAPILLARY: Glucose-Capillary: 203 mg/dL — ABNORMAL HIGH (ref 70–99)

## 2020-11-23 LAB — HEMOGLOBIN A1C
Hgb A1c MFr Bld: 7.6 % — ABNORMAL HIGH (ref 4.8–5.6)
Mean Plasma Glucose: 171.42 mg/dL

## 2020-11-23 LAB — TYPE AND SCREEN
ABO/RH(D): AB POS
Antibody Screen: NEGATIVE

## 2020-11-23 NOTE — Progress Notes (Signed)
PCP - Dr. [name] Cardiologist - denies  PPM/ICD - n/a Device Orders - n/a Rep Notified - n/a  Chest x-ray - n/a EKG - 05/26/20 Stress Test - denies ECHO - denies Cardiac Cath - denies  Sleep Study - denies CPAP - n/a  Blood Sugar - 203. Patient states his sugar is not usually this high. States he ate a ham and cheese sandwich, chips, and drank a regular pepsi earlier today.  Checks Blood Sugar once a day Range- 120-130 per patient  Blood Thinner Instructions:n/a Aspirin Instructions: Patient states he was instructed to stop taking Aspirin one week prior to surgery.   ERAS Protcol - Yes. Clear liquids until 7:30 A.M. day of surgery.  PRE-SURGERY Ensure or G2- n/a  COVID TEST- Patient will go for COVID testing on Friday 12/03/20 at the location on [facility]. Patient given requisition form and map with phone number and address.    Anesthesia review: Yes.   Patient denies shortness of breath, fever, cough and chest pain at PAT appointment   All instructions explained to the patient, with a verbal understanding of the material. Patient agrees to go over the instructions while at home for a better understanding. Patient also instructed to self quarantine after being tested for COVID-19. The opportunity to ask questions was provided.

## 2020-11-24 ENCOUNTER — Inpatient Hospital Stay (HOSPITAL_COMMUNITY): Payer: 59 | Admitting: Physician Assistant

## 2020-11-24 ENCOUNTER — Inpatient Hospital Stay (HOSPITAL_COMMUNITY): Payer: 59 | Admitting: Anesthesiology

## 2020-11-25 ENCOUNTER — Other Ambulatory Visit (HOSPITAL_COMMUNITY): Payer: 59

## 2020-12-01 ENCOUNTER — Ambulatory Visit: Payer: 59 | Admitting: Internal Medicine

## 2020-12-01 ENCOUNTER — Other Ambulatory Visit: Payer: Self-pay

## 2020-12-01 ENCOUNTER — Encounter: Payer: Self-pay | Admitting: Internal Medicine

## 2020-12-01 VITALS — BP 126/80 | HR 99 | Temp 97.7°F | Resp 16 | Ht 70.0 in | Wt 184.4 lb

## 2020-12-01 DIAGNOSIS — I10 Essential (primary) hypertension: Secondary | ICD-10-CM | POA: Diagnosis not present

## 2020-12-01 DIAGNOSIS — E1122 Type 2 diabetes mellitus with diabetic chronic kidney disease: Secondary | ICD-10-CM | POA: Diagnosis not present

## 2020-12-01 DIAGNOSIS — E1169 Type 2 diabetes mellitus with other specified complication: Secondary | ICD-10-CM

## 2020-12-01 DIAGNOSIS — E039 Hypothyroidism, unspecified: Secondary | ICD-10-CM

## 2020-12-01 DIAGNOSIS — E785 Hyperlipidemia, unspecified: Secondary | ICD-10-CM

## 2020-12-01 DIAGNOSIS — E559 Vitamin D deficiency, unspecified: Secondary | ICD-10-CM | POA: Diagnosis not present

## 2020-12-01 DIAGNOSIS — N182 Chronic kidney disease, stage 2 (mild): Secondary | ICD-10-CM

## 2020-12-01 DIAGNOSIS — Z79899 Other long term (current) drug therapy: Secondary | ICD-10-CM

## 2020-12-01 NOTE — Patient Instructions (Signed)

## 2020-12-01 NOTE — Progress Notes (Signed)
Future Appointments  Date Time Provider [place]  12/01/2020  2:30 PM [name], MD [facility] None  06/06/2021  3:00 PM [name], MD [facility] None    History of Present Illness:       This very nice 60 y.o. single WM presents for 6 month follow up with HTN, HLD, Pre-Diabetes and Vitamin D Deficiency.        Patient is treated for HTN & BP has been controlled at home. Today's BP is at goal -  126/80. Patient has had no complaints of any cardiac type chest pain, palpitations, dyspnea / orthopnea / PND, dizziness, claudication, or dependent edema.       Hyperlipidemia is controlled with diet & meds. Patient denies myalgias or other med SE's. Last Lipids were at goal:  Lab Results  Component Value Date   CHOL 103 08/31/2020   HDL 32 (L) 08/31/2020   LDLCALC 50 08/31/2020   TRIG 131 08/31/2020   CHOLHDL 3.2 08/31/2020     Also, the patient has history of T2_NIDDM and has had no symptoms of reactive hypoglycemia, diabetic polys, paresthesias or visual blurring.  Last A1c was not at goal:  Lab Results  Component Value Date   HGBA1C 7.6 (H) 11/23/2020        Further, the patient also has history of Vitamin D Deficiency and supplements vitamin D without any suspected side-effects. Last vitamin D was at goal:  Lab Results  Component Value Date   VD25OH 77 05/26/2020     Current Outpatient Medications on File Prior to Visit  Medication Sig   aspirin EC 81 MG tablet Take daily.   VITAMIN D5000 UNITS TABS Take  daily.   Cyclobenzaprine 10 MG tablet Take 5 mg 3 times daily as needed for muscle spasms.   DEXEDRINE SPANSULE 15 MG  Take 15 mg daily as needed (attention).   ibuprofen 200 MG tablet Take 800 as needed for moderate pain.   levothyroxine 50 MCG tablet TAKE 1 TABLET DAILY    losartan (COZAAR) 100 MG tablet Take  daily.   metFORMIN-XR) 500 MG 2 Take 1 x times daily with meals.   nicotine (NICODERM CQ - DOSED IN MG/24 HOURS) 21 mg/24hr patch  Place 21 mg onto the skin daily as needed (nicotine dependence).   Omega-3 Fatty Acids  1200 MG CPDR Take 2,400 mg daily.   tamsulosin (FLOMAX) 0.4 MG CAPS capsule Take 0.4 mg daily.   testosterone cypio 200 MG/ML injection USE 2ML IM EVERY 2 WEEKS    traMADol 50 MG tablet Take 50 mg every 6 hours as needed for pain.   TURMERIC 2,000 mg Take  daily.   vitamin C 500 MG tablet Take 2,000 mg daily as needed (immune support).   Zinc 50 MG TABS Take daily.      No Known Allergies   PMHx:   Past Medical History:  Diagnosis Date   COPD (chronic obstructive pulmonary disease) ([place])    DDD (degenerative disc disease)    Depression 10/09/2013   Diabetes mellitus without complication (HCC)    GERD (gastroesophageal reflux disease)    Heart murmur    1971   History of kidney stones    Hyperlipidemia    Hypertension    Hypogonadism male    Hypothyroidism    Vitamin D deficiency      Immunization History  Administered Date(s) Administered   DTaP 04/04/2001, 04/24/2008   Influenza Inj Mdck Quad With Preservative 02/21/2017   Influenza Whole 01/09/2013  Influenza,inj,quad, With Preservative 02/17/2016   PFIZER(Purple Top)SARS-COV-2 Vaccination 07/04/2019, 07/25/2019, 03/11/2020   PPD Test 10/09/2013, 10/13/2014, 11/17/2015, 02/21/2017, 04/02/2018, 05/07/2019, 05/26/2020   Pneumococcal Polysaccharide-23 02/21/2017   Tdap 07/17/2013     Past Surgical History:  Procedure Laterality Date   ASD REPAIR  1971   open heart   CERVICAL FUSION  1991   ESOPHAGOGASTRODUODENOSCOPY  [phone]   duodenal ulcer   LUMBAR DISC SURGERY     LUMBAR FUSION  2001   LUMBAR LAMINECTOMY  7/97    FHx:    Reviewed / unchanged  SHx:    Reviewed / unchanged   Systems Review:  Constitutional: Denies fever, chills, wt changes, headaches, insomnia, fatigue, night sweats, change in appetite. Eyes: Denies redness, blurred vision, diplopia, discharge, itchy, watery eyes.  ENT: Denies  discharge, congestion, post nasal drip, epistaxis, sore throat, earache, hearing loss, dental pain, tinnitus, vertigo, sinus pain, snoring.  CV: Denies chest pain, palpitations, irregular heartbeat, syncope, dyspnea, diaphoresis, orthopnea, PND, claudication or edema. Respiratory: denies cough, dyspnea, DOE, pleurisy, hoarseness, laryngitis, wheezing.  Gastrointestinal: Denies dysphagia, odynophagia, heartburn, reflux, water brash, abdominal pain or cramps, nausea, vomiting, bloating, diarrhea, constipation, hematemesis, melena, hematochezia  or hemorrhoids. Genitourinary: Denies dysuria, frequency, urgency, nocturia, hesitancy, discharge, hematuria or flank pain. Musculoskeletal: Denies arthralgias, myalgias, stiffness, jt. swelling, pain, limping or strain/sprain.  Skin: Denies pruritus, rash, hives, warts, acne, eczema or change in skin lesion(s). Neuro: No weakness, tremor, incoordination, spasms, paresthesia or pain. Psychiatric: Denies confusion, memory loss or sensory loss. Endo: Denies change in weight, skin or hair change.  Heme/Lymph: No excessive bleeding, bruising or enlarged lymph nodes.  Physical Exam  BP 126/80   Pulse 99   Temp 97.7 F (36.5 C)   Resp 16   Ht '5\' 10"'$  (1.778 m)   Wt 184 lb 6.4 oz (83.6 kg)   SpO2 98%   BMI 26.46 kg/m   Appears  well nourished, well groomed  and in no distress.  Eyes: PERRLA, EOMs, conjunctiva no swelling or erythema. Sinuses: No frontal/maxillary tenderness ENT/Mouth: EAC's clear, TM's nl w/o erythema, bulging. Nares clear w/o erythema, swelling, exudates. Oropharynx clear without erythema or exudates. Oral hygiene is good. Tongue normal, non obstructing. Hearing intact.  Neck: Supple. Thyroid not palpable. Car 2+/2+ without bruits, nodes or JVD. Chest: Respirations nl with BS clear & equal w/o rales, rhonchi, wheezing or stridor.  Cor: Heart sounds normal w/ regular rate and rhythm without sig. murmurs, gallops, clicks or rubs.  Peripheral pulses normal and equal  without edema.  Abdomen: Soft & bowel sounds normal. Non-tender w/o guarding, rebound, hernias, masses or organomegaly.  Lymphatics: Unremarkable.  Musculoskeletal: Full ROM all peripheral extremities, joint stability, 5/5 strength and normal gait.  Skin: Warm, dry without exposed rashes, lesions or ecchymosis apparent.  Neuro: Cranial nerves intact, reflexes equal bilaterally. Sensory-motor testing grossly intact. Tendon reflexes grossly intact.  Pysch: Alert & oriented x 3.  Insight and judgement nl & appropriate. No ideations.  Assessment and Plan:  1. Essential hypertension  - Continue medication, monitor blood pressure at home.  - Continue DASH diet.  Reminder to go to the ER if any CP,  SOB, nausea, dizziness, severe HA, changes vision/speech.    - CBC with Differential/Platelet - COMPLETE METABOLIC PANEL WITH GFR - Magnesium - TSH  2. Hyperlipidemia associated with type 2 diabetes mellitus ([place])  - Continue diet/meds, exercise,& lifestyle modifications.  - Continue monitor periodic cholesterol/liver & renal functions    - Lipid panel - TSH  3. Type 2 diabetes mellitus with stage 2 chronic kidney  disease, without long-term current use of insulin (HCC)  - Continue diet, exercise  - Lifestyle modifications.  - Monitor appropriate labs    - Hemoglobin A1c - Insulin, random  4. Vitamin D deficiency  - Continue supplementation    - VITAMIN D 25 Hydroxy  5. Hypothyroidism  - TSH  6. Medication management  - CBC with Differential/Platelet - COMPLETE METABOLIC PANEL WITH GFR - Magnesium - Lipid panel - TSH - Hemoglobin A1c - Insulin, random - VITAMIN D 25 Hydroxy         Discussed  regular exercise, BP monitoring, weight control to achieve/maintain BMI less than 25 and discussed med and SE's. Recommended labs to assess and monitor clinical status with further disposition pending results of labs.  I discussed the  assessment and treatment plan with the patient. The patient was provided an opportunity to ask questions and all were answered. The patient agreed with the plan and demonstrated an understanding of the instructions.  I provided over 30 minutes of exam, counseling, chart review and  complex critical decision making.        The patient was advised to call back or seek an in-person evaluation if the symptoms worsen or if the condition fails to improve as anticipated.   Kirtland Bouchard, MD

## 2020-12-02 LAB — COMPLETE METABOLIC PANEL WITH GFR
AG Ratio: 2.2 (calc) (ref 1.0–2.5)
ALT: 28 U/L (ref 9–46)
AST: 20 U/L (ref 10–35)
Albumin: 4.6 g/dL (ref 3.6–5.1)
Alkaline phosphatase (APISO): 61 U/L (ref 35–144)
BUN: 17 mg/dL (ref 7–25)
CO2: 27 mmol/L (ref 20–32)
Calcium: 9.8 mg/dL (ref 8.6–10.3)
Chloride: 102 mmol/L (ref 98–110)
Creat: 1.08 mg/dL (ref 0.70–1.30)
Globulin: 2.1 g/dL (calc) (ref 1.9–3.7)
Glucose, Bld: 97 mg/dL (ref 65–99)
Potassium: 4.5 mmol/L (ref 3.5–5.3)
Sodium: 138 mmol/L (ref 135–146)
Total Bilirubin: 0.3 mg/dL (ref 0.2–1.2)
Total Protein: 6.7 g/dL (ref 6.1–8.1)
eGFR: 80 mL/min/{1.73_m2} (ref >=60)

## 2020-12-02 LAB — INSULIN, RANDOM: Insulin: 23.3 u[IU]/mL — ABNORMAL HIGH

## 2020-12-02 LAB — LIPID PANEL
Cholesterol: 119 mg/dL (ref <200)
HDL: 29 mg/dL — ABNORMAL LOW (ref >=40)
LDL Cholesterol (Calc): 51 mg/dL (calc)
Non-HDL Cholesterol (Calc): 90 mg/dL (calc) (ref <130)
Total CHOL/HDL Ratio: 4.1 (calc) (ref <5.0)
Triglycerides: 367 mg/dL — ABNORMAL HIGH (ref <150)

## 2020-12-02 LAB — CBC WITH DIFFERENTIAL/PLATELET
Absolute Monocytes: 931 cells/uL (ref 200–950)
Basophils Absolute: 43 cells/uL (ref 0–200)
Basophils Relative: 0.4 %
Eosinophils Absolute: 235 cells/uL (ref 15–500)
Eosinophils Relative: 2.2 %
HCT: 48.7 % (ref 38.5–50.0)
Hemoglobin: 15.9 g/dL (ref 13.2–17.1)
Lymphs Abs: 3253 cells/uL (ref 850–3900)
MCH: 29.3 pg (ref 27.0–33.0)
MCHC: 32.6 g/dL (ref 32.0–36.0)
MCV: 89.9 fL (ref 80.0–100.0)
MPV: 11.1 fL (ref 7.5–12.5)
Monocytes Relative: 8.7 %
Neutro Abs: 6238 cells/uL (ref 1500–7800)
Neutrophils Relative %: 58.3 %
Platelets: 240 10*3/uL (ref 140–400)
RBC: 5.42 10*6/uL (ref 4.20–5.80)
RDW: 14.3 % (ref 11.0–15.0)
Total Lymphocyte: 30.4 %
WBC: 10.7 10*3/uL (ref 3.8–10.8)

## 2020-12-02 LAB — HEMOGLOBIN A1C
Hgb A1c MFr Bld: 6.9 % of total Hgb — ABNORMAL HIGH (ref <5.7)
Mean Plasma Glucose: 151 mg/dL
eAG (mmol/L): 8.4 mmol/L

## 2020-12-02 LAB — MAGNESIUM: Magnesium: 2.1 mg/dL (ref 1.5–2.5)

## 2020-12-02 LAB — TSH: TSH: 2.96 mIU/L (ref 0.40–4.50)

## 2020-12-02 NOTE — Progress Notes (Signed)
============================================================ -   Test results slightly outside the reference range are not unusual. If there is anything important, I will review this with you,  otherwise it is considered normal test values.  If you have further questions,  please do not hesitate to contact me at the office or via My Chart.  ============================================================ ============================================================  -  Total Chol = 119    &   Bad LDL Chol = 51     - Both  Excellent   - Very low risk for Heart Attack  / Stroke ============================================================ ============================================================  -  But Triglycerides (   367   ) or fats in blood are too high  (goal is less than 150)    - Recommend avoid fried & greasy foods,  sweets / candy,   - Avoid white rice  (brown or wild rice or Quinoa is OK),   - Avoid white potatoes  (sweet potatoes are OK)   - Avoid anything made from white flour  - bagels, doughnuts, rolls, buns, biscuits, white and   wheat breads, pizza crust and traditional  pasta made of white flour & egg white  - (vegetarian pasta or spinach or wheat pasta is OK).    - Multi-grain bread is OK - like multi-grain flat bread or  sandwich thins.   - Avoid alcohol in excess.   - Exercise is also important. ============================================================ ============================================================  -  A1c - Much better - Down from 7.6%  to Now  6.9%                 (     Ideal or Goal is Less than 5.7%   !    ) ============================================================ ============================================================  -  All Else - CBC - Kidneys - Electrolytes - Liver - Magnesium & Thyroid    - all  Normal /  OK ============================================================ ============================================================  -  Keep up the [place] Work  ! ============================================================ ============================================================

## 2020-12-03 ENCOUNTER — Other Ambulatory Visit: Payer: Self-pay | Admitting: Neurological Surgery

## 2020-12-04 LAB — SARS CORONAVIRUS 2 (TAT 6-24 HRS): SARS Coronavirus 2: NEGATIVE

## 2020-12-06 ENCOUNTER — Encounter (HOSPITAL_COMMUNITY): Payer: Self-pay | Admitting: Neurological Surgery

## 2020-12-06 ENCOUNTER — Ambulatory Visit (HOSPITAL_COMMUNITY)
Admission: RE | Admit: 2020-12-06 | Discharge: 2020-12-06 | Disposition: A | Discharge status: Home / Self Care | Payer: 59 | Attending: Neurological Surgery | Admitting: Neurological Surgery

## 2020-12-06 ENCOUNTER — Encounter (HOSPITAL_COMMUNITY)
Admission: RE | Disposition: A | Discharge status: Home / Self Care | Payer: Self-pay | Source: Home / Self Care | Attending: Neurological Surgery

## 2020-12-06 DIAGNOSIS — Z79890 Hormone replacement therapy (postmenopausal): Secondary | ICD-10-CM | POA: Diagnosis not present

## 2020-12-06 DIAGNOSIS — M48061 Spinal stenosis, lumbar region without neurogenic claudication: Secondary | ICD-10-CM | POA: Insufficient documentation

## 2020-12-06 DIAGNOSIS — F17210 Nicotine dependence, cigarettes, uncomplicated: Secondary | ICD-10-CM | POA: Insufficient documentation

## 2020-12-06 DIAGNOSIS — I4892 Unspecified atrial flutter: Secondary | ICD-10-CM | POA: Insufficient documentation

## 2020-12-06 DIAGNOSIS — Z7982 Long term (current) use of aspirin: Secondary | ICD-10-CM | POA: Diagnosis not present

## 2020-12-06 DIAGNOSIS — Z5309 Procedure and treatment not carried out because of other contraindication: Secondary | ICD-10-CM | POA: Insufficient documentation

## 2020-12-06 DIAGNOSIS — M4686 Other specified inflammatory spondylopathies, lumbar region: Secondary | ICD-10-CM | POA: Insufficient documentation

## 2020-12-06 DIAGNOSIS — Z981 Arthrodesis status: Secondary | ICD-10-CM | POA: Diagnosis not present

## 2020-12-06 DIAGNOSIS — Z791 Long term (current) use of non-steroidal anti-inflammatories (NSAID): Secondary | ICD-10-CM | POA: Diagnosis not present

## 2020-12-06 DIAGNOSIS — Z7984 Long term (current) use of oral hypoglycemic drugs: Secondary | ICD-10-CM | POA: Diagnosis not present

## 2020-12-06 DIAGNOSIS — I483 Typical atrial flutter: Secondary | ICD-10-CM

## 2020-12-06 LAB — ABO/RH: ABO/RH(D): AB POS

## 2020-12-06 LAB — GLUCOSE, CAPILLARY: Glucose-Capillary: 130 mg/dL — ABNORMAL HIGH (ref 70–99)

## 2020-12-06 SURGERY — POSTERIOR LUMBAR FUSION 2 LEVEL
Anesthesia: General | Site: Back

## 2020-12-06 MED ORDER — CHLORHEXIDINE GLUCONATE 0.12 % MT SOLN: 15.0000 mL | Freq: Once | OROMUCOSAL | Status: AC

## 2020-12-06 MED ORDER — MIDAZOLAM HCL 2 MG/2ML IJ SOLN
INTRAMUSCULAR | Status: AC
  Filled 2020-12-06: qty 2

## 2020-12-06 MED ORDER — DEXAMETHASONE SODIUM PHOSPHATE 10 MG/ML IJ SOLN
INTRAMUSCULAR | Status: AC
  Filled 2020-12-06: qty 1

## 2020-12-06 MED ORDER — LACTATED RINGERS IV SOLN: INTRAVENOUS | Status: DC

## 2020-12-06 MED ORDER — ROCURONIUM BROMIDE 10 MG/ML (PF) SYRINGE
PREFILLED_SYRINGE | INTRAVENOUS | Status: AC
  Filled 2020-12-06: qty 10

## 2020-12-06 MED ORDER — LABETALOL HCL 5 MG/ML IV SOLN
INTRAVENOUS | Status: AC
  Administered 2020-12-06: 5 mg via INTRAVENOUS
  Filled 2020-12-06: qty 4

## 2020-12-06 MED ORDER — APIXABAN 5 MG PO TABS
5.0000 mg | ORAL_TABLET | ORAL | Status: AC
  Administered 2020-12-06: 5 mg via ORAL
  Filled 2020-12-06: qty 1

## 2020-12-06 MED ORDER — ONDANSETRON HCL 4 MG/2ML IJ SOLN
INTRAMUSCULAR | Status: AC
  Filled 2020-12-06: qty 2

## 2020-12-06 MED ORDER — APIXABAN 5 MG PO TABS: 5.0000 mg | ORAL_TABLET | Freq: Two times a day (BID) | ORAL | 2 refills | Status: DC

## 2020-12-06 MED ORDER — LABETALOL HCL 5 MG/ML IV SOLN
5.0000 mg | Freq: Two times a day (BID) | INTRAVENOUS | Status: AC | PRN
  Administered 2020-12-06: 5 mg via INTRAVENOUS

## 2020-12-06 MED ORDER — ORAL CARE MOUTH RINSE: 15.0000 mL | Freq: Once | OROMUCOSAL | Status: AC

## 2020-12-06 MED ORDER — LIDOCAINE 2% (20 MG/ML) 5 ML SYRINGE
INTRAMUSCULAR | Status: AC
  Filled 2020-12-06: qty 5

## 2020-12-06 MED ORDER — APIXABAN 5 MG PO TABS
5.0000 mg | ORAL_TABLET | Freq: Once | ORAL | Status: DC
  Filled 2020-12-06: qty 1

## 2020-12-06 MED ORDER — CHLORHEXIDINE GLUCONATE CLOTH 2 % EX PADS: 6.0000 | MEDICATED_PAD | Freq: Once | CUTANEOUS | Status: DC

## 2020-12-06 MED ORDER — CHLORHEXIDINE GLUCONATE 0.12 % MT SOLN
OROMUCOSAL | Status: AC
  Administered 2020-12-06: 15 mL via OROMUCOSAL
  Filled 2020-12-06: qty 15

## 2020-12-06 MED ORDER — CEFAZOLIN SODIUM-DEXTROSE 2-4 GM/100ML-% IV SOLN
2.0000 g | INTRAVENOUS | Status: DC
  Filled 2020-12-06: qty 100

## 2020-12-06 MED ORDER — FENTANYL CITRATE (PF) 250 MCG/5ML IJ SOLN
INTRAMUSCULAR | Status: AC
  Filled 2020-12-06: qty 5

## 2020-12-06 NOTE — Anesthesia Preprocedure Evaluation (Addendum)
Anesthesia Evaluation  Patient identified by MRN, date of birth, ID band Patient awake    Reviewed: Allergy & Precautions, NPO status , Patient's Chart, lab work & pertinent test results  Airway Mallampati: I  TM Distance: >3 FB Neck ROM: Full    Dental  (+) Teeth Intact, Dental Advisory Given   Pulmonary COPD, Current Smoker and Patient abstained from smoking.,    breath sounds clear to auscultation       Cardiovascular hypertension, Pt. on medications  Rhythm:Regular Rate:Abnormal     Neuro/Psych PSYCHIATRIC DISORDERS Depression    GI/Hepatic Neg liver ROS, GERD  ,  Endo/Other  diabetes, Type 2, Oral Hypoglycemic AgentsHypothyroidism   Renal/GU      Musculoskeletal  (+) Arthritis ,   Abdominal Normal abdominal exam  (+)   Peds  Hematology   Anesthesia Other Findings   Reproductive/Obstetrics                           Anesthesia Physical Anesthesia Plan  ASA: 3  Anesthesia Plan: General   Post-op Pain Management:    Induction: Intravenous  PONV Risk Score and Plan: 2 and Ondansetron, Midazolam and Dexamethasone  Airway Management Planned: Oral ETT  Additional Equipment: None  Intra-op Plan:   Post-operative Plan: Extubation in OR  Informed Consent: I have reviewed the patients History and Physical, chart, labs and discussed the procedure including the risks, benefits and alternatives for the proposed anesthesia with the patient or authorized representative who has indicated his/her understanding and acceptance.     Dental advisory given  Plan Discussed with: CRNA  Anesthesia Plan Comments: (Uncontrolled HTN and new onset atrial flutter. Cardiology consulted Harrell Gave, MD). Will postpone surgery. Pt will follow up with Afib clinic as outpatient. Mr. Nulph and Dr. Ellene Route in agreement. )     Anesthesia Quick Evaluation

## 2020-12-06 NOTE — Progress Notes (Signed)
Brief cardiology on call note:  Spoke with Drs. Hollis and [employer] Patient in rate controlled atrial flutter, new diagnosis but reports racing heartbeats in the past. Hemodynamically stable and BP above baseline (held losartan for surgery).  Per team, this is an elective surgery. They plan to cancel surgery today and are asking about next steps.  CHA2DS2/VAS Stroke Risk Points=2 (hypertension, diabetes). Pre-op CBC normal. Recommended starting DOAC, will get follow up in our afib clinic. Would benefit from echo and possibly cardioversion, did discuss that this may delay surgery by ~2 mos given need for uninterrupted anticoagulation.  Will request afib clinic appt. No acute indication for hospitalization.  Buford Dresser, MD, PhD, [facility] at [facility] at [facility] [address] [place], [place] [address] [phone]

## 2020-12-06 NOTE — Progress Notes (Signed)
Pt in NAD and ECG perfomed at bedside. Dr. Smith Robert made aware of the ECG results.

## 2020-12-06 NOTE — Progress Notes (Signed)
Patient cancelled in pre op short stay. Dr. Ellene Route informed patient of cancellation and will send new medication to patient pharmacy; patient verbalized understanding.  PIV removed and patient in NAD.

## 2020-12-06 NOTE — Discharge Summary (Signed)
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 Discharge diagnosis: Lumbar spondylosis and stenosis L2-3 and L4-5.  New onset atrial flutter Condition on discharge: Stable Hospital course: The patient was admitted preoperatively for a two-level decompression of the lumbar spine.  During his admission intake it was noted that he was substantially hypertensive with a blood pressure of 150/108 heart rate was 92.  An EKG demonstrated the patient was in atrial flutter with a well-controlled rate.  This appeared to be a new diagnosis although the patient has noted in the past that he would have his heart racing.  He has not had this process worked up and its unclear how long he may have been in a flutter.  The patient had been on a baby aspirin preoperatively.  We obtained a cardiac consultation verbally with Dr. Harrell Gave who noted that given his clinical stability he should be started on a apixaban and it was suggested that he should undergo an outpatient work-up.  This will be done in the atrial fibrillation clinic.  He has been given a dose of apixaban here prior to his discharge.  Surgery will be rescheduled when he is cleared from a cardiology standpoint.

## 2020-12-06 NOTE — H&P (Signed)
Paul Gates is an 60 y.o. male.   Chief Complaint: Back and bilateral leg pain HPI: Paul Gates is a 60 year old right-handed individual who has had significant lumbar spondylitic disease in the past.  Dr. Jovita Gamma did a decompression and fusion at L3-L4 in 2001.  Gotten around fairly well until last year or so when he started having increasing back pain with episodic leg pain in 1 leg or the other he notes they will get pain in the backs of the thighs and in the front of the thighs and will radiate down into the feet at times an MRI was performed that demonstrates the patient has adjacent level disease at L2-3 and again at L4-5 with a severe stenosis at L4-5 moderately severe stenosis at L2-3.  He has had all manner of conservative management including extensive physical therapy and despite this and trials of epidural injections he is having continued pain is been advised regarding surgical decompression and stabilization via a posterior approach with interbody technique at L2-3 and L4-5.  He is now admitted for the surgery.  Past Medical History:  Diagnosis Date   COPD (chronic obstructive pulmonary disease) ([place])    DDD (degenerative disc disease)    Depression 10/09/2013   Diabetes mellitus without complication (HCC)    GERD (gastroesophageal reflux disease)    Heart murmur    1971   History of kidney stones    Hyperlipidemia    Hypertension    Hypogonadism male    Hypothyroidism    Vitamin D deficiency     Past Surgical History:  Procedure Laterality Date   ASD REPAIR  1971   open heart   CERVICAL FUSION  1991   ESOPHAGOGASTRODUODENOSCOPY  [phone]   duodenal ulcer   LUMBAR DISC SURGERY     LUMBAR FUSION  2001   LUMBAR LAMINECTOMY  7/97    Family History  Problem Relation Age of Onset   Hypertension Mother    Stroke Mother    Cancer Mother        ovarian   Alcohol abuse Father    Hypertension Father    Clotting disorder Father         antiphospholipid syndrome    Diabetes Maternal Uncle    Cancer Maternal Grandmother        lung   Cancer Paternal Grandfather        lung   Colon cancer Neg Hx    Stomach cancer Neg Hx    Social History:  reports that he has been smoking cigarettes. He started smoking about 42 years ago. He has a 10.25 pack-year smoking history. He has never used smokeless tobacco. He reports current alcohol use of about 4.0 standard drinks per week. He reports that he does not use drugs.  Allergies: No Known Allergies  Medications Prior to Admission  Medication Sig Dispense Refill   aspirin EC 81 MG tablet Take 81 mg by mouth daily. Swallow whole.     Cholecalciferol (VITAMIN D-3) 5000 UNITS TABS Take 5,000 Units by mouth daily.     cyclobenzaprine (FLEXERIL) 10 MG tablet Take 5 mg by mouth 3 (three) times daily as needed for muscle spasms.     ibuprofen (ADVIL) 200 MG tablet Take 800 mg by mouth 2 (two) times daily as needed for moderate pain.     levothyroxine (SYNTHROID, LEVOTHROID) 50 MCG tablet TAKE 1 TABLET BY MOUTH ONCE DAILY BEFORE BREAKFAST (Patient taking differently: Take 50 mcg by mouth daily before  breakfast.) 90 tablet 2   losartan (COZAAR) 100 MG tablet Take 50 mg by mouth daily.     MAGNESIUM PO Take 1 capsule by mouth daily.     metFORMIN (GLUCOPHAGE-XR) 500 MG 24 hr tablet Take 1 tablet (500 mg total) by mouth 3 (three) times daily with meals. 270 tablet 1   tamsulosin (FLOMAX) 0.4 MG CAPS capsule Take 0.4 mg by mouth daily.     testosterone cypionate (DEPOTESTOSTERONE CYPIONATE) 200 MG/ML injection USE 2ML INTRAMUSCULARLY EVERY 2 WEEKS AS DIRECTED (Patient taking differently: Inject 400 mg into the muscle every 14 (fourteen) days.) 10 mL 2   traMADol (ULTRAM) 50 MG tablet Take 50 mg by mouth every 6 (six) hours as needed for pain.     Zinc 50 MG TABS Take 50 mg by mouth daily.     B-D 3CC LUER-LOK SYR 21GX1" 21G X 1" 3 ML MISC USE AS DIRECTED 20 each 0   meloxicam (MOBIC) 15 MG tablet  Take  1/2 to 1 tablet  Daily  with Food  for Pain & Inflammation (Patient taking differently: Take  1/2 to 1 tablet  Daily  with Food  for Pain & Inflammation) 90 tablet 3   ONE TOUCH ULTRA TEST test strip TEST BLOOD SUGAR 3 TIMES A DAY 100 each 3   ONETOUCH DELICA LANCETS 99991111 MISC USE TO CHECK SUGAR DAILY 100 each 0   TURMERIC PO Take 2,000 mg by mouth daily.     vitamin C (ASCORBIC ACID) 500 MG tablet Take 2,000 mg by mouth daily as needed (immune support).      Results for orders placed or performed during the hospital encounter of 12/06/20 (from the past 48 hour(s))  Glucose, capillary     Status: Abnormal   Collection Time: 12/06/20  8:48 AM  Result Value Ref Range   Glucose-Capillary 130 (H) 70 - 99 mg/dL    Comment: Glucose reference range applies only to samples taken after fasting for at least 8 hours.  ABO/Rh     Status: None (Preliminary result)   Collection Time: 12/06/20  9:12 AM  Result Value Ref Range   ABO/RH(D) PENDING    No results found.  Review of Systems  Constitutional:  Positive for activity change.  HENT: Negative.    Eyes: Negative.   Respiratory: Negative.    Cardiovascular: Negative.   Gastrointestinal: Negative.   Endocrine: Negative.   Genitourinary: Negative.   Musculoskeletal:  Positive for back pain, gait problem and myalgias.  Allergic/Immunologic: Negative.   Neurological:  Positive for weakness and numbness.  Hematological: Negative.   Psychiatric/Behavioral: Negative.     Blood pressure (!) 166/112, pulse 97, temperature 98.2 F (36.8 C), temperature source Oral, resp. rate 18, height '5\' 9"'$  (1.753 m), SpO2 99 %. Physical Exam Constitutional:      Appearance: Normal appearance. He is normal weight.  HENT:     Head: Normocephalic and atraumatic.     Right Ear: Tympanic membrane normal.     Left Ear: Tympanic membrane normal.     Nose: Nose normal.     Mouth/Throat:     Mouth: Mucous membranes are dry.  Eyes:     Extraocular Movements:  Extraocular movements intact.     Pupils: Pupils are equal, round, and reactive to light.  Cardiovascular:     Rate and Rhythm: Normal rate and regular rhythm.  Pulmonary:     Effort: Pulmonary effort is normal.     Breath sounds: Normal breath sounds.  Abdominal:     General: Abdomen is flat. Bowel sounds are normal.     Palpations: Abdomen is soft.  Musculoskeletal:     Cervical back: Normal range of motion and neck supple.     Comments: Positive straight leg raising at 30 degrees in either lower extremity Patrick's maneuver is negative bilaterally  Skin:    General: Skin is warm and dry.     Capillary Refill: Capillary refill takes less than 2 seconds.  Neurological:     Mental Status: He is alert.     Comments: Strength is intact in the iliopsoas and the quadriceps tibialis anterior and gastrocs.  Deep tendon reflexes are absent in both patellae trace in both Achilles.  Sensation is intact into the lower extremities to pin light touch and vibration.  Station and gait is normal.  Cranial nerve examination is normal.  Upper extremity strength and reflexes are normal.  Psychiatric:        Mood and Affect: Mood normal.        Behavior: Behavior normal.        Thought Content: Thought content normal.        Judgment: Judgment normal.     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  History of fusion L3-4.  Plan: Decompression and fusion L2-3 and L4-5 with fixation from L2-L5.  Earleen Newport, MD 12/06/2020, 10:04 AM

## 2020-12-14 ENCOUNTER — Other Ambulatory Visit: Payer: Self-pay

## 2020-12-14 ENCOUNTER — Ambulatory Visit (HOSPITAL_COMMUNITY)
Admit: 2020-12-14 | Discharge: 2020-12-14 | Disposition: A | Discharge status: Home / Self Care | Payer: 59 | Source: Ambulatory Visit | Attending: Nurse Practitioner | Admitting: Nurse Practitioner

## 2020-12-14 ENCOUNTER — Encounter (HOSPITAL_COMMUNITY): Payer: Self-pay | Admitting: Nurse Practitioner

## 2020-12-14 VITALS — BP 148/80 | HR 95 | Ht 69.0 in | Wt 181.8 lb

## 2020-12-14 DIAGNOSIS — E119 Type 2 diabetes mellitus without complications: Secondary | ICD-10-CM | POA: Insufficient documentation

## 2020-12-14 DIAGNOSIS — Z7984 Long term (current) use of oral hypoglycemic drugs: Secondary | ICD-10-CM | POA: Insufficient documentation

## 2020-12-14 DIAGNOSIS — Z87891 Personal history of nicotine dependence: Secondary | ICD-10-CM | POA: Diagnosis not present

## 2020-12-14 DIAGNOSIS — Z7901 Long term (current) use of anticoagulants: Secondary | ICD-10-CM | POA: Insufficient documentation

## 2020-12-14 DIAGNOSIS — I10 Essential (primary) hypertension: Secondary | ICD-10-CM | POA: Insufficient documentation

## 2020-12-14 DIAGNOSIS — I483 Typical atrial flutter: Secondary | ICD-10-CM | POA: Diagnosis present

## 2020-12-14 DIAGNOSIS — D6869 Other thrombophilia: Secondary | ICD-10-CM

## 2020-12-14 DIAGNOSIS — Z79899 Other long term (current) drug therapy: Secondary | ICD-10-CM | POA: Insufficient documentation

## 2020-12-14 DIAGNOSIS — Z8249 Family history of ischemic heart disease and other diseases of the circulatory system: Secondary | ICD-10-CM | POA: Insufficient documentation

## 2020-12-15 ENCOUNTER — Encounter (HOSPITAL_COMMUNITY): Payer: Self-pay | Admitting: Nurse Practitioner

## 2020-12-15 NOTE — Progress Notes (Signed)
Primary Care Physician: [name], MD Referring Physician: Dr. Christen Bame Paul Gates is a 60 y.o. male with a h/o HTM, DM, that presented to the surgical center for back surgery and was found to be in new onset rate controlled atrial flutter. He was asymptomatic.  He was seen in consult by Dr. Harrell Gave.  Surgery was cancelled. Anticoagulation  was started for a CHA2DS2VASc  score of 2 and he was referred to the afib clinic.   Today he remains asymptomatic.  His ekg shows rate controlled typical atrial flutter as well as the ekg from 8/15. He denies any issues with alcohol, snoring or tobacco. He is on eliquis 5 mg bid x one week.   Today, he denies symptoms of palpitations, chest pain, shortness of breath, orthopnea, PND, lower extremity edema, dizziness, presyncope, syncope, or neurologic sequela. The patient is tolerating medications without difficulties and is otherwise without complaint today.   Past Medical History:  Diagnosis Date   COPD (chronic obstructive pulmonary disease) ([place])    DDD (degenerative disc disease)    Depression 10/09/2013   Diabetes mellitus without complication (HCC)    GERD (gastroesophageal reflux disease)    Heart murmur    1971   History of kidney stones    Hyperlipidemia    Hypertension    Hypogonadism male    Hypothyroidism    Vitamin D deficiency    Past Surgical History:  Procedure Laterality Date   ASD REPAIR  1971   open heart   CERVICAL FUSION  1991   ESOPHAGOGASTRODUODENOSCOPY  [phone]   duodenal ulcer   LUMBAR DISC SURGERY     LUMBAR FUSION  2001   LUMBAR LAMINECTOMY  7/97    Current Outpatient Medications  Medication Sig Dispense Refill   apixaban (ELIQUIS) 5 MG TABS tablet Take 1 tablet (5 mg total) by mouth 2 (two) times daily. 60 tablet 2   B-D 3CC LUER-LOK SYR 21GX1" 21G X 1" 3 ML MISC USE AS DIRECTED 20 each 0   Cholecalciferol (VITAMIN D-3) 5000 UNITS TABS Take 5,000 Units by mouth daily.      cyclobenzaprine (FLEXERIL) 10 MG tablet Take 5 mg by mouth 3 (three) times daily as needed for muscle spasms.     levothyroxine (SYNTHROID, LEVOTHROID) 50 MCG tablet TAKE 1 TABLET BY MOUTH ONCE DAILY BEFORE BREAKFAST (Patient taking differently: Take 50 mcg by mouth daily before breakfast.) 90 tablet 2   losartan (COZAAR) 100 MG tablet Take 50 mg by mouth daily.     MAGNESIUM PO Take 1 capsule by mouth daily.     metFORMIN (GLUCOPHAGE-XR) 500 MG 24 hr tablet Take 1 tablet (500 mg total) by mouth 3 (three) times daily with meals. 270 tablet 1   Omega-3 Fatty Acids (FISH OIL) 1200 MG CAPS Take 2 capsules by mouth daily. Nature made     ONE TOUCH ULTRA TEST test strip TEST BLOOD SUGAR 3 TIMES A DAY 100 each 3   ONETOUCH DELICA LANCETS 99991111 MISC USE TO CHECK SUGAR DAILY 100 each 0   tamsulosin (FLOMAX) 0.4 MG CAPS capsule Take 0.4 mg by mouth daily.     testosterone cypionate (DEPOTESTOSTERONE CYPIONATE) 200 MG/ML injection USE 2ML INTRAMUSCULARLY EVERY 2 WEEKS AS DIRECTED (Patient taking differently: Inject 400 mg into the muscle every 14 (fourteen) days.) 10 mL 2   traMADol (ULTRAM) 50 MG tablet Take 50 mg by mouth every 6 (six) hours as needed for pain.     vitamin C (  ASCORBIC ACID) 500 MG tablet Take 2,000 mg by mouth daily as needed (immune support).     Zinc 50 MG TABS Take 50 mg by mouth daily.     ibuprofen (ADVIL) 200 MG tablet Take 800 mg by mouth 2 (two) times daily as needed for moderate pain. (Patient not taking: Reported on 12/14/2020)     No current facility-administered medications for this encounter.    No Known Allergies  Social History   Socioeconomic History   Marital status: Single    Spouse name: Not on file   Number of children: Not on file   Years of education: Not on file   Highest education level: Not on file  Occupational History   Not on file  Tobacco Use   Smoking status: Former    Packs/day: 0.25    Years: 41.00    Pack years: 10.25    Types: Cigarettes     Start date: 18    Quit date: 12/06/2020    Years since quitting: 0.0   Smokeless tobacco: Never   Tobacco comments:    Currently down to smoking 0.5 pack   Vaping Use   Vaping Use: Never used  Substance and Sexual Activity   Alcohol use: Yes    Alcohol/week: 4.0 standard drinks    Types: 4 Standard drinks or equivalent per week    Comment: socially- 2-3 drinks weekly   Drug use: No   Sexual activity: Not on file  Other Topics Concern   Not on file  Social History Narrative   Not on file   Social Determinants of Health   Financial Resource Strain: Not on file  Food Insecurity: Not on file  Transportation Needs: Not on file  Physical Activity: Not on file  Stress: Not on file  Social Connections: Not on file  Intimate Partner Violence: Not on file    Family History  Problem Relation Age of Onset   Hypertension Mother    Stroke Mother    Cancer Mother        ovarian   Alcohol abuse Father    Hypertension Father    Clotting disorder Father        antiphospholipid syndrome    Diabetes Maternal Uncle    Cancer Maternal Grandmother        lung   Cancer Paternal Grandfather        lung   Colon cancer Neg Hx    Stomach cancer Neg Hx     ROS- All systems are reviewed and negative except as per the HPI above  Physical Exam: Vitals:   12/14/20 0858  BP: (!) 148/80  Pulse: 95  Weight: 82.5 kg  Height: '5\' 9"'$  (1.753 m)   Wt Readings from Last 3 Encounters:  12/14/20 82.5 kg  12/01/20 83.6 kg  11/23/20 82.1 kg    Labs: Lab Results  Component Value Date   NA 138 12/01/2020   K 4.5 12/01/2020   CL 102 12/01/2020   CO2 27 12/01/2020   GLUCOSE 97 12/01/2020   BUN 17 12/01/2020   CREATININE 1.08 12/01/2020   CALCIUM 9.8 12/01/2020   MG 2.1 12/01/2020   No results found for: INR Lab Results  Component Value Date   CHOL 119 12/01/2020   HDL 29 (L) 12/01/2020   LDLCALC 51 12/01/2020   TRIG 367 (H) 12/01/2020     GEN- The patient is well appearing,  alert and oriented x 3 today.   Head- normocephalic, atraumatic Eyes-  Sclera clear, conjunctiva pink Ears- hearing intact Oropharynx- clear Neck- supple, no JVP Lymph- no cervical lymphadenopathy Lungs- Clear to ausculation bilaterally, normal work of breathing Heart- Regular rate and rhythm, no murmurs, rubs or gallops, PMI not laterally displaced GI- soft, NT, ND, + BS Extremities- no clubbing, cyanosis, or edema MS- no significant deformity or atrophy Skin- no rash or lesion Psych- euthymic mood, full affect Neuro- strength and sensation are intact  EKG-Vent. rate 95 BPM PR interval 140 ms QRS duration 82 ms QT/QTcB 320/402 ms P-R-T axes 85 74 -57 Typical Atrial flutter Inferior infarct , age undetermined T wave abnormality, consider lateral ischemia Abnormal ECG    Assessment and Plan:  1. New onset typical atrial flutter General education re atrial flutter  We discussed options to treat I believe his options are cardioversion vrs typical atrial flutter ablation  If he has the flutter ablation then he could come off anticoagulation after 4 weeks vrs having to be on anticoagulation long term with cardioversion.  I will refer to EP to further discuss  2. HTN Stable   3. DM Per pcp   4.  Lumbar Back issues Surgery on hold until treatment for atrial flutter further evaluated   Referral placed with Dr. Rayann Heman for Monday 12/20/20  Geroge Baseman. Hildred Pharo, [facility] [address] [place], [place] [address] [phone]

## 2020-12-20 ENCOUNTER — Other Ambulatory Visit: Payer: Self-pay

## 2020-12-20 ENCOUNTER — Encounter: Payer: Self-pay | Admitting: Internal Medicine

## 2020-12-20 ENCOUNTER — Ambulatory Visit: Payer: 59 | Admitting: Internal Medicine

## 2020-12-20 ENCOUNTER — Encounter: Payer: Self-pay | Admitting: *Deleted

## 2020-12-20 VITALS — BP 116/70 | HR 92 | Ht 69.0 in | Wt 180.8 lb

## 2020-12-20 DIAGNOSIS — I483 Typical atrial flutter: Secondary | ICD-10-CM

## 2020-12-20 NOTE — Progress Notes (Signed)
Electrophysiology Office Note   Date:  12/20/2020   ID:  Paul Gates May 08, 1960, MRN [id number]  PCP:  Paul Pinto, MD  Cardiologist:   Primary Electrophysiologist: Paul Grayer, MD    CC: atrial flutter   History of Present Illness: Paul Gates is a 60 y.o. male who presents today for electrophysiology evaluation.   He is referred by Paul Palau NP with the AF clinic for EP consultation regarding atrial flutter.  He recently presented for back surgery and was found to have atrial flutter.  His surgery was cancelled.  He was evaluated and started on eliquis.  He has been seen in the AF clinic.   Today, he denies symptoms of palpitations, chest pain, shortness of breath, orthopnea, PND, lower extremity edema, claudication, dizziness, presyncope, syncope, bleeding, or neurologic sequela. The patient is tolerating medications without difficulties and is otherwise without complaint today.    Past Medical History:  Diagnosis Date   COPD (chronic obstructive pulmonary disease) ([place])    DDD (degenerative disc disease)    Depression 10/09/2013   Diabetes mellitus without complication (HCC)    GERD (gastroesophageal reflux disease)    Heart murmur    1971   History of kidney stones    Hyperlipidemia    Hypertension    Hypogonadism male    Hypothyroidism    Typical atrial flutter ([place])    Vitamin D deficiency    Past Surgical History:  Procedure Laterality Date   ASD REPAIR  1971   open heart   CERVICAL FUSION  1991   ESOPHAGOGASTRODUODENOSCOPY  [phone]   duodenal ulcer   LUMBAR DISC SURGERY     LUMBAR FUSION  2001   LUMBAR LAMINECTOMY  7/97     Current Outpatient Medications  Medication Sig Dispense Refill   apixaban (ELIQUIS) 5 MG TABS tablet Take 1 tablet (5 mg total) by mouth 2 (two) times daily. 60 tablet 2   B-D 3CC LUER-LOK SYR 21GX1" 21G X 1" 3 ML MISC USE AS DIRECTED 20 each 0   Cholecalciferol (VITAMIN D-3) 5000  UNITS TABS Take 5,000 Units by mouth daily.     cyclobenzaprine (FLEXERIL) 10 MG tablet Take 5 mg by mouth 3 (three) times daily as needed for muscle spasms.     ibuprofen (ADVIL) 200 MG tablet Take 800 mg by mouth 2 (two) times daily as needed for moderate pain.     levothyroxine (SYNTHROID, LEVOTHROID) 50 MCG tablet TAKE 1 TABLET BY MOUTH ONCE DAILY BEFORE BREAKFAST (Patient taking differently: Take 50 mcg by mouth daily before breakfast.) 90 tablet 2   losartan (COZAAR) 100 MG tablet Take 50 mg by mouth daily.     MAGNESIUM PO Take 1 capsule by mouth daily.     metFORMIN (GLUCOPHAGE-XR) 500 MG 24 hr tablet Take 1 tablet (500 mg total) by mouth 3 (three) times daily with meals. 270 tablet 1   Omega-3 Fatty Acids (FISH OIL) 1200 MG CAPS Take 2 capsules by mouth daily. Nature made     ONE TOUCH ULTRA TEST test strip TEST BLOOD SUGAR 3 TIMES A DAY 100 each 3   ONETOUCH DELICA LANCETS 99991111 MISC USE TO CHECK SUGAR DAILY 100 each 0   tamsulosin (FLOMAX) 0.4 MG CAPS capsule Take 0.4 mg by mouth daily.     testosterone cypionate (DEPOTESTOSTERONE CYPIONATE) 200 MG/ML injection USE 2ML INTRAMUSCULARLY EVERY 2 WEEKS AS DIRECTED (Patient taking differently: Inject 400 mg into the muscle every 14 (fourteen) days.) 10  mL 2   traMADol (ULTRAM) 50 MG tablet Take 50 mg by mouth every 6 (six) hours as needed for pain.     vitamin C (ASCORBIC ACID) 500 MG tablet Take 2,000 mg by mouth daily as needed (immune support).     Zinc 50 MG TABS Take 50 mg by mouth daily.     No current facility-administered medications for this visit.    Allergies:   Patient has no known allergies.   Social History:  The patient  reports that he quit smoking about 2 weeks ago. His smoking use included cigarettes. He started smoking about 42 years ago. He has a 10.25 pack-year smoking history. He has never used smokeless tobacco. He reports current alcohol use of about 4.0 standard drinks per week. He reports that he does not use drugs.    Family History:  The patient's family history includes Alcohol abuse in his father; Cancer in his maternal grandmother, mother, and paternal grandfather; Clotting disorder in his father; Diabetes in his maternal uncle; Hypertension in his father and mother; Stroke in his mother.    ROS:  Please see the history of present illness.   All other systems are personally reviewed and negative.    PHYSICAL EXAM: VS:  BP 116/70   Pulse 92   Ht '5\' 9"'$  (1.753 m)   Wt 180 lb 12.8 oz (82 kg)   SpO2 94%   BMI 26.70 kg/m  , BMI Body mass index is 26.7 kg/m. GEN: Well nourished, well developed, in no acute distress HEENT: normal Neck: no JVD, carotid bruits, or masses Cardiac: iRRR; no murmurs, rubs, or gallops,no edema  Respiratory:  clear to auscultation bilaterally, normal work of breathing GI: soft, nontender, nondistended, + BS MS: no deformity or atrophy Skin: warm and dry  Neuro:  Strength and sensation are intact Psych: euthymic mood, full affect  EKG:  EKG is ordered today. The ekg ordered today is personally reviewed and shows typical appearing atrial flutter   Recent Labs: 12/01/2020: ALT 28; BUN 17; Creat 1.08; Hemoglobin 15.9; Magnesium 2.1; Platelets 240; Potassium 4.5; Sodium 138; TSH 2.96  personally reviewed   Lipid Panel     Component Value Date/Time   CHOL 119 12/01/2020 1425   TRIG 367 (H) 12/01/2020 1425   HDL 29 (L) 12/01/2020 1425   CHOLHDL 4.1 12/01/2020 1425   VLDL 48 (H) 08/29/2016 1617   LDLCALC 51 12/01/2020 1425   personally reviewed   Wt Readings from Last 3 Encounters:  12/20/20 180 lb 12.8 oz (82 kg)  12/14/20 181 lb 12.8 oz (82.5 kg)  12/01/20 184 lb 6.4 oz (83.6 kg)      Other studies personally reviewed: Additional studies/ records that were reviewed today include: prior ecgs, office notes  Review of the above records today demonstrates: as above   ASSESSMENT AND PLAN:  1.  Typical appearing atrial flutter S/p surgical ASD repair in  1971 which could complicate the picture. Therapeutic strategies for atrial flutter including medicine and ablation were discussed in detail with the patient today. Risk, benefits, and alternatives to EP study and radiofrequency ablation were also discussed in detail today. These risks include but are not limited to stroke, bleeding, vascular damage, tamponade, perforation, damage to the heart and other structures, AV block requiring pacemaker, worsening renal function, and death. The patient understands these risk and wishes to proceed.  We will therefore proceed with catheter ablation at the next available time. Carto and Anesthesia are requested.  Signed, Jeneen Rinks  Tammra Pressman, MD  12/20/2020 1:58 PM     [address] [place] [address] [phone] (office) [phone] (fax)

## 2020-12-20 NOTE — Patient Instructions (Addendum)
Medication Instructions:  Your physician recommends that you continue on your current medications as directed. Please refer to the Current Medication list given to you today.  Labwork: None ordered.  Testing/Procedures: Your physician has requested that you have an echocardiogram. Echocardiography is a painless test that uses sound waves to create images of your heart. It provides your doctor with information about the size and shape of your heart and how well your heart's chambers and valves are working. This procedure takes approximately one hour. There are no restrictions for this procedure.   Your physician has recommended that you have an ablation. Catheter ablation is a medical procedure used to treat some cardiac arrhythmias (irregular heartbeats). During catheter ablation, a long, thin, flexible tube is put into a blood vessel in your groin (upper thigh), or neck. This tube is called an ablation catheter. It is then guided to your heart through the blood vessel. Radio frequency waves destroy small areas of heart tissue where abnormal heartbeats may cause an arrhythmia to start. Please see the instruction sheet given to you today.  Any Other Special Instructions Will Be Listed Below (If Applicable).  If you need a refill on your cardiac medications before your next appointment, please call your pharmacy.   Cardiac Ablation Cardiac ablation is a procedure to destroy (ablate) some heart tissue that is sending bad signals. These bad signals causeproblems in heart rhythm. The heart has many areas that make these signals. If there are problems in these areas, they can make the heart beat in a way that is not normal.Destroying some tissues can help make the heart rhythm normal. Tell your doctor about: Any allergies you have. All medicines you are taking. These include vitamins, herbs, eye drops, creams, and over-the-counter medicines. Any problems you or family members have had with medicines  that make you fall asleep (anesthetics). Any blood disorders you have. Any surgeries you have had. Any medical conditions you have, such as kidney failure. Whether you are pregnant or may be pregnant. What are the risks? This is a safe procedure. But problems may occur, including: Infection. Bruising and bleeding. Bleeding into the chest. Stroke or blood clots. Damage to nearby areas of your body. Allergies to medicines or dyes. The need for a pacemaker if the normal system is damaged. Failure of the procedure to treat the problem. What happens before the procedure? Medicines Ask your doctor about: Changing or stopping your normal medicines. This is important. Taking aspirin and ibuprofen. Do not take these medicines unless your doctor tells you to take them. Taking other medicines, vitamins, herbs, and supplements. General instructions Follow instructions from your doctor about what you cannot eat or drink. Plan to have someone take you home from the hospital or clinic. If you will be going home right after the procedure, plan to have someone with you for 24 hours. Ask your doctor what steps will be taken to prevent infection. What happens during the procedure?  An IV tube will be put into one of your veins. You will be given a medicine to help you relax. The skin on your neck or groin will be numbed. A cut (incision) will be made in your neck or groin. A needle will be put through your cut and into a large vein. A tube (catheter) will be put into the needle. The tube will be moved to your heart. Dye may be put through the tube. This helps your doctor see your heart. Small devices (electrodes) on the tube will  send out signals. A type of energy will be used to destroy some heart tissue. The tube will be taken out. Pressure will be held on your cut. This helps stop bleeding. A bandage will be put over your cut. The exact procedure may vary among doctors and hospitals. What  happens after the procedure? You will be watched until you leave the hospital or clinic. This includes checking your heart rate, breathing rate, oxygen, and blood pressure. Your cut will be watched for bleeding. You will need to lie still for a few hours. Do not drive for 24 hours or as long as your doctor tells you. Summary Cardiac ablation is a procedure to destroy some heart tissue. This is done to treat heart rhythm problems. Tell your doctor about any medical conditions you may have. Tell him or her about all medicines you are taking to treat them. This is a safe procedure. But problems may occur. These include infection, bruising, bleeding, and damage to nearby areas of your body. Follow what your doctor tells you about food and drink. You may also be told to change or stop some of your medicines. After the procedure, do not drive for 24 hours or as long as your doctor tells you. This information is not intended to replace advice given to you by your health care provider. Make sure you discuss any questions you have with your healthcare provider. Document Revised: 03/13/2019 Document Reviewed: 03/13/2019 Elsevier Patient Education  2022 [employer].

## 2021-01-10 ENCOUNTER — Other Ambulatory Visit: Payer: Self-pay

## 2021-01-10 ENCOUNTER — Ambulatory Visit (HOSPITAL_COMMUNITY): Payer: 59 | Attending: Cardiology

## 2021-01-10 DIAGNOSIS — I483 Typical atrial flutter: Secondary | ICD-10-CM

## 2021-01-10 LAB — ECHOCARDIOGRAM COMPLETE: S' Lateral: 3.5 cm

## 2021-01-12 ENCOUNTER — Other Ambulatory Visit: Payer: Self-pay

## 2021-01-12 ENCOUNTER — Other Ambulatory Visit: Payer: 59 | Admitting: *Deleted

## 2021-01-12 DIAGNOSIS — I483 Typical atrial flutter: Secondary | ICD-10-CM

## 2021-01-13 LAB — CBC WITH DIFFERENTIAL/PLATELET
Basophils Absolute: 0.1 10*3/uL (ref 0.0–0.2)
Basos: 1 %
EOS (ABSOLUTE): 0.1 10*3/uL (ref 0.0–0.4)
Eos: 1 %
Hematocrit: 52.9 % — ABNORMAL HIGH (ref 37.5–51.0)
Hemoglobin: 17.8 g/dL — ABNORMAL HIGH (ref 13.0–17.7)
Immature Grans (Abs): 0 10*3/uL (ref 0.0–0.1)
Immature Granulocytes: 0 %
Lymphocytes Absolute: 2.9 10*3/uL (ref 0.7–3.1)
Lymphs: 32 %
MCH: 29.7 pg (ref 26.6–33.0)
MCHC: 33.6 g/dL (ref 31.5–35.7)
MCV: 88 fL (ref 79–97)
Monocytes Absolute: 0.8 10*3/uL (ref 0.1–0.9)
Monocytes: 8 %
Neutrophils Absolute: 5.3 10*3/uL (ref 1.4–7.0)
Neutrophils: 58 %
Platelets: 285 10*3/uL (ref 150–450)
RBC: 6 x10E6/uL — ABNORMAL HIGH (ref 4.14–5.80)
RDW: 13.4 % (ref 11.6–15.4)
WBC: 9.2 10*3/uL (ref 3.4–10.8)

## 2021-01-13 LAB — BASIC METABOLIC PANEL
BUN/Creatinine Ratio: 21 — ABNORMAL HIGH (ref 9–20)
BUN: 21 mg/dL (ref 6–24)
CO2: 20 mmol/L (ref 20–29)
Calcium: 10.7 mg/dL — ABNORMAL HIGH (ref 8.7–10.2)
Chloride: 100 mmol/L (ref 96–106)
Creatinine, Ser: 1.01 mg/dL (ref 0.76–1.27)
Glucose: 107 mg/dL — ABNORMAL HIGH (ref 65–99)
Potassium: 5.5 mmol/L — ABNORMAL HIGH (ref 3.5–5.2)
Sodium: 140 mmol/L (ref 134–144)
eGFR: 87 mL/min/{1.73_m2} (ref >59)

## 2021-01-20 ENCOUNTER — Telehealth: Payer: Self-pay | Admitting: *Deleted

## 2021-01-20 DIAGNOSIS — I10 Essential (primary) hypertension: Secondary | ICD-10-CM

## 2021-01-20 DIAGNOSIS — Z79899 Other long term (current) drug therapy: Secondary | ICD-10-CM

## 2021-01-20 DIAGNOSIS — E875 Hyperkalemia: Secondary | ICD-10-CM

## 2021-01-20 MED ORDER — LOSARTAN POTASSIUM 25 MG PO TABS: 25.0000 mg | ORAL_TABLET | Freq: Every day | ORAL | 3 refills | Status: DC

## 2021-01-20 NOTE — Telephone Encounter (Signed)
Preliminary labs reviewed by nurse, awaiting provider signature K is elevated at 5.5  Spoke with PA Andy hold Losartan Friday, Saturday, Sunday. Restart Losartan Monday at 25 mg QD. Come to office and get repeat lab work.   Patient verbalized understanding and agreement.

## 2021-01-24 ENCOUNTER — Other Ambulatory Visit: Payer: 59 | Admitting: *Deleted

## 2021-01-24 ENCOUNTER — Other Ambulatory Visit: Payer: Self-pay

## 2021-01-24 DIAGNOSIS — I10 Essential (primary) hypertension: Secondary | ICD-10-CM

## 2021-01-24 DIAGNOSIS — Z79899 Other long term (current) drug therapy: Secondary | ICD-10-CM

## 2021-01-24 DIAGNOSIS — E875 Hyperkalemia: Secondary | ICD-10-CM

## 2021-01-25 ENCOUNTER — Institutional Professional Consult (permissible substitution): Payer: 59 | Admitting: Internal Medicine

## 2021-01-25 LAB — BASIC METABOLIC PANEL
BUN/Creatinine Ratio: 12 (ref 9–20)
BUN: 14 mg/dL (ref 6–24)
CO2: 20 mmol/L (ref 20–29)
Calcium: 9.7 mg/dL (ref 8.7–10.2)
Chloride: 103 mmol/L (ref 96–106)
Creatinine, Ser: 1.16 mg/dL (ref 0.76–1.27)
Glucose: 82 mg/dL (ref 70–99)
Potassium: 4.5 mmol/L (ref 3.5–5.2)
Sodium: 141 mmol/L (ref 134–144)
eGFR: 73 mL/min/{1.73_m2} (ref >59)

## 2021-01-31 NOTE — Pre-Procedure Instructions (Signed)
Instructed patient on the following items: Arrival time 1130 Nothing to eat or drink after midnight No meds AM of procedure Responsible person to drive you home and stay with you for 24 hrs  Have you missed any doses of anti-coagulant Eliquis- hasn't missed any doses    

## 2021-02-01 ENCOUNTER — Ambulatory Visit (HOSPITAL_COMMUNITY): Payer: 59 | Admitting: Anesthesiology

## 2021-02-01 ENCOUNTER — Ambulatory Visit (HOSPITAL_COMMUNITY)
Admission: RE | Admit: 2021-02-01 | Discharge: 2021-02-01 | Disposition: A | Discharge status: Home / Self Care | Payer: 59 | Attending: Internal Medicine | Admitting: Internal Medicine

## 2021-02-01 ENCOUNTER — Encounter (HOSPITAL_COMMUNITY): Payer: Self-pay | Admitting: Internal Medicine

## 2021-02-01 ENCOUNTER — Encounter (HOSPITAL_COMMUNITY)
Admission: RE | Disposition: A | Discharge status: Home / Self Care | Payer: Self-pay | Source: Home / Self Care | Attending: Internal Medicine

## 2021-02-01 ENCOUNTER — Other Ambulatory Visit: Payer: Self-pay

## 2021-02-01 DIAGNOSIS — Z79890 Hormone replacement therapy (postmenopausal): Secondary | ICD-10-CM | POA: Insufficient documentation

## 2021-02-01 DIAGNOSIS — I4892 Unspecified atrial flutter: Secondary | ICD-10-CM | POA: Insufficient documentation

## 2021-02-01 DIAGNOSIS — Z87891 Personal history of nicotine dependence: Secondary | ICD-10-CM | POA: Diagnosis not present

## 2021-02-01 DIAGNOSIS — Z79899 Other long term (current) drug therapy: Secondary | ICD-10-CM | POA: Insufficient documentation

## 2021-02-01 DIAGNOSIS — Z7984 Long term (current) use of oral hypoglycemic drugs: Secondary | ICD-10-CM | POA: Insufficient documentation

## 2021-02-01 DIAGNOSIS — Z7901 Long term (current) use of anticoagulants: Secondary | ICD-10-CM | POA: Insufficient documentation

## 2021-02-01 DIAGNOSIS — Z8249 Family history of ischemic heart disease and other diseases of the circulatory system: Secondary | ICD-10-CM | POA: Diagnosis not present

## 2021-02-01 HISTORY — PX: A-FLUTTER ABLATION: EP1230

## 2021-02-01 LAB — GLUCOSE, CAPILLARY
Glucose-Capillary: 109 mg/dL — ABNORMAL HIGH (ref 70–99)
Glucose-Capillary: 98 mg/dL (ref 70–99)

## 2021-02-01 SURGERY — A-FLUTTER ABLATION
Anesthesia: General

## 2021-02-01 MED ORDER — DEXAMETHASONE SODIUM PHOSPHATE 10 MG/ML IJ SOLN
INTRAMUSCULAR | Status: DC | PRN
  Administered 2021-02-01: 10 mg via INTRAVENOUS

## 2021-02-01 MED ORDER — SUGAMMADEX SODIUM 200 MG/2ML IV SOLN
INTRAVENOUS | Status: DC | PRN
  Administered 2021-02-01: 200 mg via INTRAVENOUS

## 2021-02-01 MED ORDER — PHENYLEPHRINE 40 MCG/ML (10ML) SYRINGE FOR IV PUSH (FOR BLOOD PRESSURE SUPPORT)
PREFILLED_SYRINGE | INTRAVENOUS | Status: DC | PRN
  Administered 2021-02-01: 80 ug via INTRAVENOUS
  Administered 2021-02-01: 120 ug via INTRAVENOUS

## 2021-02-01 MED ORDER — EPHEDRINE SULFATE-NACL 50-0.9 MG/10ML-% IV SOSY
PREFILLED_SYRINGE | INTRAVENOUS | Status: DC | PRN
  Administered 2021-02-01: 5 mg via INTRAVENOUS

## 2021-02-01 MED ORDER — SODIUM CHLORIDE 0.9 % IV SOLN: 250.0000 mL | INTRAVENOUS | Status: DC | PRN

## 2021-02-01 MED ORDER — PROPOFOL 10 MG/ML IV BOLUS
INTRAVENOUS | Status: DC | PRN
  Administered 2021-02-01: 120 mg via INTRAVENOUS
  Administered 2021-02-01: 40 mg via INTRAVENOUS

## 2021-02-01 MED ORDER — MIDAZOLAM HCL 5 MG/5ML IJ SOLN
INTRAMUSCULAR | Status: DC | PRN
  Administered 2021-02-01: 2 mg via INTRAVENOUS

## 2021-02-01 MED ORDER — HEPARIN (PORCINE) IN NACL 1000-0.9 UT/500ML-% IV SOLN
INTRAVENOUS | Status: AC
  Filled 2021-02-01: qty 500

## 2021-02-01 MED ORDER — APIXABAN 5 MG PO TABS
5.0000 mg | ORAL_TABLET | Freq: Once | ORAL | Status: AC
  Administered 2021-02-01: 5 mg via ORAL
  Filled 2021-02-01: qty 1

## 2021-02-01 MED ORDER — ONDANSETRON HCL 4 MG/2ML IJ SOLN: 4.0000 mg | Freq: Four times a day (QID) | INTRAMUSCULAR | Status: DC | PRN

## 2021-02-01 MED ORDER — ACETAMINOPHEN 325 MG PO TABS
650.0000 mg | ORAL_TABLET | ORAL | Status: DC | PRN
  Filled 2021-02-01: qty 2

## 2021-02-01 MED ORDER — ONDANSETRON HCL 4 MG/2ML IJ SOLN
INTRAMUSCULAR | Status: DC | PRN
  Administered 2021-02-01: 4 mg via INTRAVENOUS

## 2021-02-01 MED ORDER — SODIUM CHLORIDE 0.9% FLUSH: 3.0000 mL | INTRAVENOUS | Status: DC | PRN

## 2021-02-01 MED ORDER — HEPARIN (PORCINE) IN NACL 1000-0.9 UT/500ML-% IV SOLN
INTRAVENOUS | Status: DC | PRN
  Administered 2021-02-01: 500 mL

## 2021-02-01 MED ORDER — HYDROCODONE-ACETAMINOPHEN 5-325 MG PO TABS: 1.0000 | ORAL_TABLET | ORAL | Status: DC | PRN

## 2021-02-01 MED ORDER — SODIUM CHLORIDE 0.9 % IV SOLN: INTRAVENOUS | Status: DC

## 2021-02-01 MED ORDER — ROCURONIUM BROMIDE 10 MG/ML (PF) SYRINGE
PREFILLED_SYRINGE | INTRAVENOUS | Status: DC | PRN
  Administered 2021-02-01: 20 mg via INTRAVENOUS
  Administered 2021-02-01: 80 mg via INTRAVENOUS

## 2021-02-01 MED ORDER — FENTANYL CITRATE (PF) 250 MCG/5ML IJ SOLN
INTRAMUSCULAR | Status: DC | PRN
  Administered 2021-02-01: 50 ug via INTRAVENOUS

## 2021-02-01 MED ORDER — PHENYLEPHRINE HCL-NACL 20-0.9 MG/250ML-% IV SOLN
INTRAVENOUS | Status: DC | PRN
  Administered 2021-02-01: 25 ug/min via INTRAVENOUS

## 2021-02-01 MED ORDER — LIDOCAINE 2% (20 MG/ML) 5 ML SYRINGE
INTRAMUSCULAR | Status: DC | PRN
  Administered 2021-02-01: 60 mg via INTRAVENOUS

## 2021-02-01 MED ORDER — SODIUM CHLORIDE 0.9% FLUSH: 3.0000 mL | Freq: Two times a day (BID) | INTRAVENOUS | Status: DC

## 2021-02-01 SURGICAL SUPPLY — 11 items
CATH EZ STEER NAV 8MM F-J CUR (ABLATOR) ×2 IMPLANT
CATH WEBSTER BI DIR CS D-F CRV (CATHETERS) ×2 IMPLANT
CLOSURE PERCLOSE PROSTYLE (VASCULAR PRODUCTS) ×4 IMPLANT
MAT PREVALON FULL STRYKER (MISCELLANEOUS) ×2 IMPLANT
PACK EP LATEX FREE (CUSTOM PROCEDURE TRAY) ×1
PACK EP LF (CUSTOM PROCEDURE TRAY) ×1 IMPLANT
PAD PRO RADIOLUCENT 2001M-C (PAD) ×2 IMPLANT
PATCH CARTO3 (PAD) ×2 IMPLANT
SHEATH PINNACLE 7F 10CM (SHEATH) ×2 IMPLANT
SHEATH PINNACLE 8F 10CM (SHEATH) ×2 IMPLANT
SHEATH PROBE COVER 6X72 (BAG) ×2 IMPLANT

## 2021-02-01 NOTE — Progress Notes (Signed)
Pt ambulated without difficulty or bleeding.   Discharged home with his mother who will drive and stay with pt x 24 hrs.

## 2021-02-01 NOTE — Anesthesia Procedure Notes (Signed)
Procedure Name: Intubation Date/Time: 02/01/2021 1:02 PM Performed by: Kyung Rudd, CRNA Pre-anesthesia Checklist: Patient identified, Emergency Drugs available, Suction available and Patient being monitored Patient Re-evaluated:Patient Re-evaluated prior to induction Oxygen Delivery Method: Circle system utilized Preoxygenation: Pre-oxygenation with 100% oxygen Induction Type: IV induction Ventilation: Mask ventilation without difficulty and Oral airway inserted - appropriate to patient size Laryngoscope Size: Mac and 4 Grade View: Grade II Tube type: Oral Tube size: 7.5 mm Number of attempts: 1 Airway Equipment and Method: Stylet Placement Confirmation: ETT inserted through vocal cords under direct vision, positive ETCO2 and breath sounds checked- equal and bilateral Secured at: 22 cm Tube secured with: Tape Dental Injury: Teeth and Oropharynx as per pre-operative assessment

## 2021-02-01 NOTE — Anesthesia Postprocedure Evaluation (Signed)
Anesthesia Post Note  Patient: Paul Gates  Procedure(s) Performed: A-FLUTTER ABLATION     Patient location during evaluation: PACU Anesthesia Type: General Level of consciousness: awake and alert Pain management: pain level controlled Vital Signs Assessment: post-procedure vital signs reviewed and stable Respiratory status: spontaneous breathing, nonlabored ventilation, respiratory function stable and patient connected to nasal cannula oxygen Cardiovascular status: blood pressure returned to baseline and stable Postop Assessment: no apparent nausea or vomiting Anesthetic complications: no   No notable events documented.  Last Vitals:  Vitals:   02/01/21 1520 02/01/21 1540  BP: (!) 141/97 (!) 140/94  Pulse: 85 87  Resp: 18 (!) 21  Temp:    SpO2: 96% 95%    Last Pain:  Vitals:   02/01/21 1457  TempSrc:   PainSc: 0-No pain                 Benjimen Kelley L Carlyle Achenbach

## 2021-02-01 NOTE — Anesthesia Preprocedure Evaluation (Addendum)
Anesthesia Evaluation  Patient identified by MRN, date of birth, ID band Patient awake    Reviewed: Allergy & Precautions, NPO status , Patient's Chart, lab work & pertinent test results  Airway Mallampati: I  TM Distance: >3 FB Neck ROM: Full    Dental  (+) Teeth Intact, Dental Advisory Given   Pulmonary COPD, former smoker,    Pulmonary exam normal breath sounds clear to auscultation       Cardiovascular hypertension, Pt. on medications Normal cardiovascular exam+ dysrhythmias (on eliquis) Atrial Fibrillation  Rhythm:Irregular Rate:Normal  TTE 2022 1. Left ventricular ejection fraction, by estimation, is 45 to 50%. The  left ventricle has mildly decreased function. The left ventricle  demonstrates global hypokinesis. Left ventricular diastolic parameters are  indeterminate.  2. Right ventricular systolic function is low normal. The right  ventricular size is normal. Tricuspid regurgitation signal is inadequate  for assessing PA pressure.  3. The mitral valve is normal in structure. Trivial mitral valve  regurgitation. No evidence of mitral stenosis.  4. The aortic valve is tricuspid. Aortic valve regurgitation is not  visualized. No aortic stenosis is present.  5. The inferior vena cava is normal in size with <50% respiratory  variability, suggesting right atrial pressure of 8 mmHg.  6. The patient was in atrial flutter.    Neuro/Psych negative neurological ROS  negative psych ROS   GI/Hepatic Neg liver ROS, GERD  Controlled and Medicated,  Endo/Other  diabetes, Type 2, Oral Hypoglycemic AgentsHypothyroidism   Renal/GU Renal InsufficiencyRenal diseaseLab Results      Component                Value               Date                      CREATININE               1.16                01/24/2021                BUN                      14                  01/24/2021                NA                       141                  01/24/2021                K                        4.5                 01/24/2021                CL                       103                 01/24/2021                CO2  20                  01/24/2021             negative genitourinary   Musculoskeletal negative musculoskeletal ROS (+)   Abdominal   Peds  Hematology negative hematology ROS (+)   Anesthesia Other Findings   Reproductive/Obstetrics                           Anesthesia Physical Anesthesia Plan  ASA: 3  Anesthesia Plan: General   Post-op Pain Management:    Induction: Intravenous  PONV Risk Score and Plan: 2 and Midazolam, Dexamethasone and Ondansetron  Airway Management Planned: Oral ETT  Additional Equipment:   Intra-op Plan:   Post-operative Plan: Extubation in OR  Informed Consent: I have reviewed the patients History and Physical, chart, labs and discussed the procedure including the risks, benefits and alternatives for the proposed anesthesia with the patient or authorized representative who has indicated his/her understanding and acceptance.     Dental advisory given  Plan Discussed with: CRNA  Anesthesia Plan Comments:         Anesthesia Quick Evaluation

## 2021-02-01 NOTE — Transfer of Care (Signed)
Immediate Anesthesia Transfer of Care Note  Patient: Paul Gates  Procedure(s) Performed: A-FLUTTER ABLATION  Patient Location: Cath Lab  Anesthesia Type:General  Level of Consciousness: awake, alert  and oriented  Airway & Oxygen Therapy: Patient Spontanous Breathing and Patient connected to nasal cannula oxygen  Post-op Assessment: Report given to RN, Post -op Vital signs reviewed and stable and Patient moving all extremities X 4  Post vital signs: Reviewed and stable  Last Vitals:  Vitals Value Taken Time  BP 144/92 02/01/21 1445  Temp 36.7 C 02/01/21 1445  Pulse 70 02/01/21 1454  Resp 13 02/01/21 1454  SpO2 98 % 02/01/21 1454  Vitals shown include unvalidated device data.  Last Pain:  Vitals:   02/01/21 1445  TempSrc: Temporal  PainSc:       Patients Stated Pain Goal: 3 (06/22/29 4388)  Complications: No notable events documented.

## 2021-02-01 NOTE — Discharge Instructions (Addendum)
Post procedure care instructions No driving for 4 days. No lifting over 5 lbs for 1 week. No vigorous or sexual activity for 1 week. You may return to work/your usual activities on 02/09/21. Keep procedure site clean & dry. If you notice increased pain, swelling, bleeding or pus, call/return!  You may shower after 24 hours, but no soaking in baths/hot tubs/pools for 1 week.   Cardiac Ablation, Care After  This sheet gives you information about how to care for yourself after your procedure. Your health care provider may also give you more specific instructions. If you have problems or questions, contact your health care provider. What can I expect after the procedure? After the procedure, it is common to have: Bruising around your puncture site. Tenderness around your puncture site. Skipped heartbeats. Tiredness (fatigue).  Follow these instructions at home: Puncture site care  Follow instructions from your health care provider about how to take care of your puncture site. Make sure you: If present, leave stitches (sutures), skin glue, or adhesive strips in place. These skin closures may need to stay in place for up to 2 weeks. If adhesive strip edges start to loosen and curl up, you may trim the loose edges. Do not remove adhesive strips completely unless your health care provider tells you to do that. If a large square bandage is present, this may be removed 24 hours after surgery.  Check your puncture site every day for signs of infection. Check for: Redness, swelling, or pain. Fluid or blood. If your puncture site starts to bleed, lie down on your back, apply firm pressure to the area, and contact your health care provider. Warmth. Pus or a bad smell. Driving Do not drive for at least 4 days after your procedure or however long your health care provider recommends. (Do not resume driving if you have previously been instructed not to drive for other health reasons.) Do not drive or use  heavy machinery while taking prescription pain medicine. Activity Avoid activities that take a lot of effort for at least 7 days after your procedure. Do not lift anything that is heavier than 5 lb (4.5 kg) for one week.  No sexual activity for 1 week.  Return to your normal activities as told by your health care provider. Ask your health care provider what activities are safe for you. General instructions Take over-the-counter and prescription medicines only as told by your health care provider. Do not use any products that contain nicotine or tobacco, such as cigarettes and e-cigarettes. If you need help quitting, ask your health care provider. You may shower after 24 hours, but Do not take baths, swim, or use a hot tub for 1 week.  Do not drink alcohol for 24 hours after your procedure. Keep all follow-up visits as told by your health care provider. This is important. Contact a health care provider if: You have redness, mild swelling, or pain around your puncture site. You have fluid or blood coming from your puncture site that stops after applying firm pressure to the area. Your puncture site feels warm to the touch. You have pus or a bad smell coming from your puncture site. You have a fever. You have chest pain or discomfort that spreads to your neck, jaw, or arm. You are sweating a lot. You feel nauseous. You have a fast or irregular heartbeat. You have shortness of breath. You are dizzy or light-headed and feel the need to lie down. You have pain or numbness in  the arm or leg closest to your puncture site. Get help right away if: Your puncture site suddenly swells. Your puncture site is bleeding and the bleeding does not stop after applying firm pressure to the area. These symptoms may represent a serious problem that is an emergency. Do not wait to see if the symptoms will go away. Get medical help right away. Call your local emergency services (911 in the [place] Do not drive  yourself to the hospital. Summary After the procedure, it is normal to have bruising and tenderness at the puncture site in your groin, neck, or forearm. Check your puncture site every day for signs of infection. Get help right away if your puncture site is bleeding and the bleeding does not stop after applying firm pressure to the area. This is a medical emergency. This information is not intended to replace advice given to you by your health care provider. Make sure you discuss any questions you have with your health care provider.

## 2021-02-01 NOTE — H&P (Signed)
ID:  Paul Gates, DOB 20-Oct-1960, MRN [id number]   PCP:  [name], MD             Cardiologist:   Primary Electrophysiologist: Thompson Grayer, MD        CC: atrial flutter   History of Present Illness: Paul Gates is a 60 y.o. male who presents today for electrophysiology study and ablation of atrial flutter.   He recently presented for back surgery and was found to have atrial flutter.  His surgery was cancelled.  He was evaluated and started on eliquis.  He has been seen in the AF clinic.     Today, he denies symptoms of palpitations, exertional chest pain, shortness of breath, orthopnea, PND, lower extremity edema, claudication, dizziness, presyncope, syncope, bleeding, or neurologic sequela. The patient is tolerating medications without difficulties and is otherwise without complaint today.          Past Medical History:  Diagnosis Date   COPD (chronic obstructive pulmonary disease) ([place])     DDD (degenerative disc disease)     Depression 10/09/2013   Diabetes mellitus without complication (HCC)     GERD (gastroesophageal reflux disease)     Heart murmur      1971   History of kidney stones     Hyperlipidemia     Hypertension     Hypogonadism male     Hypothyroidism     Typical atrial flutter ([place])     Vitamin D deficiency           Past Surgical History:  Procedure Laterality Date   ASD REPAIR   1971    open heart   CERVICAL FUSION   1991   ESOPHAGOGASTRODUODENOSCOPY   [phone]    duodenal ulcer   LUMBAR DISC SURGERY       LUMBAR FUSION   2001   LUMBAR LAMINECTOMY   7/97              Current Outpatient Medications  Medication Sig Dispense Refill   apixaban (ELIQUIS) 5 MG TABS tablet Take 1 tablet (5 mg total) by mouth 2 (two) times daily. 60 tablet 2   B-D 3CC LUER-LOK SYR 21GX1" 21G X 1" 3 ML MISC USE AS DIRECTED 20 each 0   Cholecalciferol (VITAMIN D-3) 5000 UNITS TABS Take 5,000 Units by mouth daily.        cyclobenzaprine (FLEXERIL) 10 MG tablet Take 5 mg by mouth 3 (three) times daily as needed for muscle spasms.       ibuprofen (ADVIL) 200 MG tablet Take 800 mg by mouth 2 (two) times daily as needed for moderate pain.       levothyroxine (SYNTHROID, LEVOTHROID) 50 MCG tablet TAKE 1 TABLET BY MOUTH ONCE DAILY BEFORE BREAKFAST (Patient taking differently: Take 50 mcg by mouth daily before breakfast.) 90 tablet 2   losartan (COZAAR) 100 MG tablet Take 50 mg by mouth daily.       MAGNESIUM PO Take 1 capsule by mouth daily.       metFORMIN (GLUCOPHAGE-XR) 500 MG 24 hr tablet Take 1 tablet (500 mg total) by mouth 3 (three) times daily with meals. 270 tablet 1   Omega-3 Fatty Acids (FISH OIL) 1200 MG CAPS Take 2 capsules by mouth daily. Nature made       ONE TOUCH ULTRA TEST test strip TEST BLOOD SUGAR 3 TIMES A DAY 100 each 3   ONETOUCH DELICA LANCETS 56D MISC USE TO CHECK SUGAR DAILY  100 each 0   tamsulosin (FLOMAX) 0.4 MG CAPS capsule Take 0.4 mg by mouth daily.       testosterone cypionate (DEPOTESTOSTERONE CYPIONATE) 200 MG/ML injection USE 2ML INTRAMUSCULARLY EVERY 2 WEEKS AS DIRECTED (Patient taking differently: Inject 400 mg into the muscle every 14 (fourteen) days.) 10 mL 2   traMADol (ULTRAM) 50 MG tablet Take 50 mg by mouth every 6 (six) hours as needed for pain.       vitamin C (ASCORBIC ACID) 500 MG tablet Take 2,000 mg by mouth daily as needed (immune support).       Zinc 50 MG TABS Take 50 mg by mouth daily.        No current facility-administered medications for this visit.      Allergies:   Patient has no known allergies.    Social History:  The patient  reports that he quit smoking about 2 weeks ago. His smoking use included cigarettes. He started smoking about 42 years ago. He has a 10.25 pack-year smoking history. He has never used smokeless tobacco. He reports current alcohol use of about 4.0 standard drinks per week. He reports that he does not use drugs.    Family History:  The  patient's family history includes Alcohol abuse in his father; Cancer in his maternal grandmother, mother, and paternal grandfather; Clotting disorder in his father; Diabetes in his maternal uncle; Hypertension in his father and mother; Stroke in his mother.      ROS:  Please see the history of present illness.   All other systems are personally reviewed and negative.      PHYSICAL EXAM: Vitals:   02/01/21 1124  BP: (!) 140/111  Pulse: 74  Resp: 15  Temp: 97.9 F (36.6 C)  SpO2: 97%    GEN: Well nourished, well developed, in no acute distress HEENT: normal Neck: no JVD, carotid bruits, or masses Cardiac: iRRR; no murmurs, rubs, or gallops,no edema  Respiratory:  clear to auscultation bilaterally, normal work of breathing GI: soft, nontender, nondistended, + BS MS: no deformity or atrophy Skin: warm and dry  Neuro:  Strength and sensation are intact Psych: euthymic mood, full affect    Other studies personally reviewed: Additional studies/ records that were reviewed today include: prior ecgs, office notes  Review of the above records today demonstrates: as above     ASSESSMENT AND PLAN:   1.  Typical appearing atrial flutter S/p surgical ASD repair in 1971 which could complicate the picture. Therapeutic strategies for atrial flutter including medicine and ablation were discussed in detail with the patient today.   Therapeutic strategies for atrial flutter including medicine and ablation were discussed in detail with the patient today. Risk, benefits, and alternatives to EP study and radiofrequency ablation were also discussed in detail today. These risks include but are not limited to stroke, bleeding, vascular damage, tamponade, perforation, damage to the heart and other structures, AV block requiring pacemaker, worsening renal function, and death. The patient understands these risk and wishes to proceed.    He reports compliance with eliquis without interruption.  Thompson Grayer MD, [place] 02/01/2021 12:14 PM

## 2021-02-07 MED ORDER — METOPROLOL SUCCINATE ER 25 MG PO TB24: 25.0000 mg | ORAL_TABLET | Freq: Every day | ORAL | 3 refills | Status: DC

## 2021-02-07 NOTE — Telephone Encounter (Signed)
Shirley Friar, PA-C  2:06 PM Toprol XL 25 mg qhs would be a good option for him with borderline EF.   Sent in new prescription for patient. Verbalized agreement and understanding.

## 2021-02-21 ENCOUNTER — Other Ambulatory Visit: Payer: Self-pay | Admitting: Neurological Surgery

## 2021-02-22 HISTORY — PX: LUMBAR FUSION: SHX111

## 2021-02-28 ENCOUNTER — Other Ambulatory Visit: Payer: Self-pay

## 2021-02-28 MED ORDER — APIXABAN 5 MG PO TABS: 5.0000 mg | ORAL_TABLET | Freq: Two times a day (BID) | ORAL | 6 refills | Status: DC

## 2021-02-28 NOTE — Telephone Encounter (Signed)
Eliquis 5 mg refill request received. Patient is 60 years old, weight-81.6 kg, Crea- 1.16 on 01/24/21, Diagnosis-atrial flutter, and last seen by Dr. Rayann Heman on 12/20/20. Dose is appropriate based on dosing criteria. Will send in refill to requested pharmacy.

## 2021-03-04 ENCOUNTER — Encounter: Payer: Self-pay | Admitting: Internal Medicine

## 2021-03-04 ENCOUNTER — Ambulatory Visit: Payer: 59 | Admitting: Internal Medicine

## 2021-03-04 ENCOUNTER — Other Ambulatory Visit: Payer: Self-pay

## 2021-03-04 ENCOUNTER — Telehealth: Payer: Self-pay | Admitting: *Deleted

## 2021-03-04 VITALS — BP 124/70 | HR 72 | Ht 69.0 in | Wt 184.8 lb

## 2021-03-04 DIAGNOSIS — I483 Typical atrial flutter: Secondary | ICD-10-CM

## 2021-03-04 NOTE — Patient Instructions (Addendum)
Medication Instructions:  Stop Eliquis Stop Metoprolol succinate Your physician recommends that you continue on your current medications as directed. Please refer to the Current Medication list given to you today. *If you need a refill on your cardiac medications before your next appointment, please call your pharmacy*  Lab Work: None. If you have labs (blood work) drawn today and your tests are completely normal, you will receive your results only by: [place] (if you have MyChart) OR A paper copy in the mail If you have any lab test that is abnormal or we need to change your treatment, we will call you to review the results.  Testing/Procedures: Your physician has requested that you have an echocardiogram. Echocardiography is a painless test that uses sound waves to create images of your heart. It provides your doctor with information about the size and shape of your heart and how well your heart's chambers and valves are working. This procedure takes approximately one hour. There are no restrictions for this procedure.   Follow-Up: At [facility], you and your health needs are our priority.  As part of our continuing mission to provide you with exceptional heart care, we have created designated Provider Care Teams.  These Care Teams include your primary Cardiologist (physician) and Advanced Practice Providers (APPs -  Physician Assistants and Nurse Practitioners) who all work together to provide you with the care you need, when you need it.  Your physician wants you to follow-up in: 3 months with  one of the following Advanced Practice Providers on your designated Care Team:    Tommye Standard, [place]  We recommend signing up for the patient portal called "MyChart".  Sign up information is provided on this After Visit Summary.  MyChart is used to connect with patients for Virtual Visits (Telemedicine).  Patients are able to view lab/test results, encounter notes, upcoming appointments,  etc.  Non-urgent messages can be sent to your provider as well.   To learn more about what you can do with MyChart, go to [email or web address].    Any Other Special Instructions Will Be Listed Below (If Applicable).

## 2021-03-04 NOTE — Progress Notes (Signed)
PCP: [name], MD   Primary EP: Dr Rayann Heman  Francoise Ceo III is a 60 y.o. male who presents today for routine electrophysiology followup.  Since his recent atrial flutter ablation, the patient reports doing very well.  Denies procedure related complications,  no arrhythmias.  Today, he denies symptoms of palpitations, chest pain, shortness of breath,  lower extremity edema, dizziness, presyncope, or syncope.  The patient is otherwise without complaint today.   Past Medical History:  Diagnosis Date   COPD (chronic obstructive pulmonary disease) ([place])    DDD (degenerative disc disease)    Depression 10/09/2013   Diabetes mellitus without complication (HCC)    GERD (gastroesophageal reflux disease)    Heart murmur    1971   History of kidney stones    Hyperlipidemia    Hypertension    Hypogonadism male    Hypothyroidism    Typical atrial flutter ([place])    Vitamin D deficiency    Past Surgical History:  Procedure Laterality Date   A-FLUTTER ABLATION N/A 02/01/2021   Procedure: A-FLUTTER ABLATION;  Surgeon: Thompson Grayer, MD;  Location: [facility];  Service: Cardiovascular;  Laterality: N/A;   ASD REPAIR  1971   open heart   CERVICAL FUSION  1991   ESOPHAGOGASTRODUODENOSCOPY  1987,1992,1993,1995   duodenal ulcer   LUMBAR DISC SURGERY     LUMBAR FUSION  2001   LUMBAR LAMINECTOMY  7/97    ROS- all systems are reviewed and negatives except as per HPI above  Current Outpatient Medications  Medication Sig Dispense Refill   apixaban (ELIQUIS) 5 MG TABS tablet Take 1 tablet (5 mg total) by mouth 2 (two) times daily. 60 tablet 6   B-D 3CC LUER-LOK SYR 21GX1" 21G X 1" 3 ML MISC USE AS DIRECTED 20 each 0   Cholecalciferol (VITAMIN D-3) 5000 UNITS TABS Take 5,000 Units by mouth daily.     cyclobenzaprine (FLEXERIL) 10 MG tablet Take 5 mg by mouth daily as needed for muscle spasms.     ibuprofen (ADVIL) 200 MG tablet Take 600-800 mg by mouth 2 (two) times daily as  needed for moderate pain.     levothyroxine (SYNTHROID, LEVOTHROID) 50 MCG tablet TAKE 1 TABLET BY MOUTH ONCE DAILY BEFORE BREAKFAST (Patient taking differently: Take 50 mcg by mouth daily before breakfast.) 90 tablet 2   losartan (COZAAR) 25 MG tablet Take 1 tablet (25 mg total) by mouth daily. 90 tablet 3   MAGNESIUM PO Take 400 mg by mouth daily.     metFORMIN (GLUCOPHAGE-XR) 500 MG 24 hr tablet Take 1 tablet (500 mg total) by mouth 3 (three) times daily with meals. 270 tablet 1   metoprolol succinate (TOPROL XL) 25 MG 24 hr tablet Take 1 tablet (25 mg total) by mouth at bedtime. 90 tablet 3   Omega-3 Fatty Acids (FISH OIL) 1200 MG CAPS Take 1,200 mg by mouth daily. Nature made     ONE TOUCH ULTRA TEST test strip TEST BLOOD SUGAR 3 TIMES A DAY 100 each 3   ONETOUCH DELICA LANCETS 97L MISC USE TO CHECK SUGAR DAILY 100 each 0   tamsulosin (FLOMAX) 0.4 MG CAPS capsule Take 0.4 mg by mouth daily.     testosterone cypionate (DEPOTESTOSTERONE CYPIONATE) 200 MG/ML injection USE 2ML INTRAMUSCULARLY EVERY 2 WEEKS AS DIRECTED (Patient taking differently: Inject 400 mg into the muscle every 14 (fourteen) days.) 10 mL 2   traMADol (ULTRAM) 50 MG tablet Take 50 mg by mouth every 6 (  six) hours as needed for pain.     vitamin C (ASCORBIC ACID) 500 MG tablet Take 2,000 mg by mouth daily as needed (immune support).     Zinc 50 MG TABS Take 50 mg by mouth daily.     No current facility-administered medications for this visit.    Physical Exam: Vitals:   03/04/21 1442  BP: 124/70  Pulse: 72  SpO2: 94%  Weight: 184 lb 12.8 oz (83.8 kg)  Height: 5\' 9"  (1.753 m)    GEN- The patient is well appearing, alert and oriented x 3 today.   Head- normocephalic, atraumatic Eyes-  Sclera clear, conjunctiva pink Ears- hearing intact Oropharynx- clear Lungs- Clear to ausculation bilaterally, normal work of breathing Heart- Regular rate and rhythm, no murmurs, rubs or gallops, PMI not laterally displaced GI- soft,  NT, ND, + BS Extremities- no clubbing, cyanosis, or edema  Wt Readings from Last 3 Encounters:  03/04/21 184 lb 12.8 oz (83.8 kg)  02/01/21 180 lb (81.6 kg)  12/20/20 180 lb 12.8 oz (82 kg)    EKG tracing ordered today is personally reviewed and shows sinus  Assessment and Plan:  Typical appearing atrial flutter  Resolved s/p ablation  stop Benitez Stop toprol (was giving him headaches)  2. HTN Stop toprol  3. Nonischemic CM Repeat echo in 3 months If EF remains depressed will need general cardiology follow-up EP APP will assess this on return and we will then see prn     Thompson Grayer MD, [facility] 03/04/2021 2:48 PM

## 2021-03-04 NOTE — Telephone Encounter (Signed)
   Pre-operative Risk Assessment    Patient Name: Paul Gates  DOB: [birth year] MRN: [id number]      Request for Surgical Clearance   Procedure:   L2-3, L4-5 POSTERIOR LUMBAR INTERBODY FUSION  Date of Surgery: Clearance 03/22/21                                 Surgeon:  DR. Kristeen Miss Surgeon's Group or Practice Name:  [facility] Phone number:  [phone] Fax number:  [phone] ATTN: JESSICA   Type of Clearance Requested: - Medical    Type of Anesthesia:   General    Additional requests/questions:   Jiles Prows   03/04/2021, 4:26 PM

## 2021-03-07 NOTE — Telephone Encounter (Signed)
Covering preop today. Patient recently saw Dr. Rayann Heman in follow-up 03/04/21 for follow-up of atrial flutter and cardiomyopathy. Anticoagulation and metoprolol discontinued. Repeat echo planned in 3 months. No prior ischemic eval - felt to require general cardiology follow-up if EF remained low. Will route to Dr. Rayann Heman for input on clearing for back surgery as requested. Dr. Rayann Heman - Please route response to [facility] PREOP (the pre-op pool). Thank you.

## 2021-03-08 NOTE — Addendum Note (Signed)
Addended by: Rose Phi on: 03/08/2021 02:15 PM   Modules accepted: Orders

## 2021-03-11 NOTE — Progress Notes (Signed)
Surgical Instructions    Your procedure is scheduled on 03/22/21.  Report to [facility] Main Entrance "A" at 5:30 A.M., then check in with the Admitting office.  Call this number if you have problems the morning of surgery:  [phone]   If you have any questions prior to your surgery date call [phone]: Open Monday-Friday 8am-4pm    Remember:  Do not eat after midnight the night before your surgery  You may drink clear liquids until 4:30am the morning of your surgery.   Clear liquids allowed are: Water, Non-Citrus Juices (without pulp), Carbonated Beverages, Clear Tea, Black Coffee ONLY (NO MILK, CREAM OR POWDERED CREAMER of any kind), and Gatorade    Take these medicines the morning of surgery with A SIP OF WATER  cetirizine (ZYRTEC)  levothyroxine (SYNTHROID, LEVOTHROID) omeprazole (PRILOSEC OTC)  tamsulosin (FLOMAX)  IF NEEDED: acetaminophen (TYLENOL)  cyclobenzaprine (FLEXERIL)  traMADol (ULTRAM)  As of today, STOP taking any Aspirin (unless otherwise instructed by your surgeon) Aleve, Naproxen, Ibuprofen, Motrin, Advil, Goody's, BC's, all herbal medications, fish oil, and all vitamins.  WHAT DO I DO ABOUT MY DIABETES MEDICATION?   Do not take oral diabetes medicines (pills) the morning of surgery.  THE MORNING OF SURGERY, do not take metFORMIN (GLUCOPHAGE-XR)  The day of surgery, do not take other diabetes injectables, including Byetta (exenatide), Bydureon (exenatide ER), Victoza (liraglutide), or Trulicity (dulaglutide).  If your CBG is greater than 220 mg/dL, you may take  of your sliding scale (correction) dose of insulin.   HOW TO MANAGE YOUR DIABETES BEFORE AND AFTER SURGERY  Why is it important to control my blood sugar before and after surgery? Improving blood sugar levels before and after surgery helps healing and can limit problems. A way of improving blood sugar control is eating a healthy diet by:  Eating less sugar and carbohydrates   Increasing activity/exercise  Talking with your doctor about reaching your blood sugar goals High blood sugars (greater than 180 mg/dL) can raise your risk of infections and slow your recovery, so you will need to focus on controlling your diabetes during the weeks before surgery. Make sure that the doctor who takes care of your diabetes knows about your planned surgery including the date and location.  How do I manage my blood sugar before surgery? Check your blood sugar at least 4 times a day, starting 2 days before surgery, to make sure that the level is not too high or low.  Check your blood sugar the morning of your surgery when you wake up and every 2 hours until you get to the Short Stay unit.  If your blood sugar is less than 70 mg/dL, you will need to treat for low blood sugar: Do not take insulin. Treat a low blood sugar (less than 70 mg/dL) with  cup of clear juice (cranberry or apple), 4 glucose tablets, OR glucose gel. Recheck blood sugar in 15 minutes after treatment (to make sure it is greater than 70 mg/dL). If your blood sugar is not greater than 70 mg/dL on recheck, call [phone] for further instructions. Report your blood sugar to the short stay nurse when you get to Short Stay.  If you are admitted to the hospital after surgery: Your blood sugar will be checked by the staff and you will probably be given insulin after surgery (instead of oral diabetes medicines) to make sure you have good blood sugar levels. The goal for blood sugar control after surgery is 80-180 mg/dL.  After your COVID test   You are not required to quarantine however you are required to wear a well-fitting mask when you are out and around people not in your household.  If your mask becomes wet or soiled, replace with a new one.  Wash your hands often with soap and water for 20 seconds or clean your hands with an alcohol-based hand sanitizer that contains at least 60% alcohol.  Do not share  personal items.  Notify your provider: if you are in close contact with someone who has COVID  or if you develop a fever of 100.4 or greater, sneezing, cough, sore throat, shortness of breath or body aches.             Do not wear jewelry or makeup Do not wear lotions, powders, perfumes/colognes, or deodorant. Men may shave face and neck. Do not bring valuables to the hospital. DO Not wear nail polish, gel polish, artificial nails, or any other type of covering on natural nails including finger and toenails. If patients have artificial nails, gel coating, etc. that need to be removed by a nail salon, please have this removed prior to surgery or surgery may need to be canceled/delayed if the surgeon/ anesthesia feels like the patient is unable to be adequately monitored.             [facility] is not responsible for any belongings or valuables.  Do NOT Smoke (Tobacco/Vaping)  24 hours prior to your procedure  If you use a CPAP at night, you may bring your mask for your overnight stay.   Contacts, glasses, hearing aids, dentures or partials may not be worn into surgery, please bring cases for these belongings   For patients admitted to the hospital, discharge time will be determined by your treatment team.   Patients discharged the day of surgery will not be allowed to drive home, and someone needs to stay with them for 24 hours.  NO VISITORS WILL BE ALLOWED IN PRE-OP WHERE PATIENTS ARE PREPPED FOR SURGERY.  ONLY 1 SUPPORT PERSON MAY BE PRESENT IN THE WAITING ROOM WHILE YOU ARE IN SURGERY.  IF YOU ARE TO BE ADMITTED, ONCE YOU ARE IN YOUR ROOM YOU WILL BE ALLOWED TWO (2) VISITORS. 1 (ONE) VISITOR MAY STAY OVERNIGHT BUT MUST ARRIVE TO THE ROOM BY 8pm.  Minor children may have two parents present. Special consideration for safety and communication needs will be reviewed on a case by case basis.  Special instructions:    Oral Hygiene is also important to reduce your risk of infection.   Remember - BRUSH YOUR TEETH THE MORNING OF SURGERY WITH YOUR REGULAR TOOTHPASTE   [facility]- Preparing For Surgery  Before surgery, you can play an important role. Because skin is not sterile, your skin needs to be as free of germs as possible. You can reduce the number of germs on your skin by washing with CHG (chlorahexidine gluconate) Soap before surgery.  CHG is an antiseptic cleaner which kills germs and bonds with the skin to continue killing germs even after washing.     Please do not use if you have an allergy to CHG or antibacterial soaps. If your skin becomes reddened/irritated stop using the CHG.  Do not shave (including legs and underarms) for at least 48 hours prior to first CHG shower. It is OK to shave your face.  Please follow these instructions carefully.     Shower the NIGHT BEFORE SURGERY and the MORNING OF SURGERY  with CHG Soap.   If you chose to wash your hair, wash your hair first as usual with your normal shampoo. After you shampoo, rinse your hair and body thoroughly to remove the shampoo.  Then [employer] and genitals (private parts) with your normal soap and rinse thoroughly to remove soap.  After that Use CHG Soap as you would any other liquid soap. You can apply CHG directly to the skin and wash gently with a scrungie or a clean washcloth.   Apply the CHG Soap to your body ONLY FROM THE NECK DOWN.  Do not use on open wounds or open sores. Avoid contact with your eyes, ears, mouth and genitals (private parts). Wash Face and genitals (private parts)  with your normal soap.   Wash thoroughly, paying special attention to the area where your surgery will be performed.  Thoroughly rinse your body with warm water from the neck down.  DO NOT shower/wash with your normal soap after using and rinsing off the CHG Soap.  Pat yourself dry with a CLEAN TOWEL.  Wear CLEAN PAJAMAS to bed the night before surgery  Place CLEAN SHEETS on your bed the night before your  surgery  DO NOT SLEEP WITH PETS.   Day of Surgery: Take a shower with CHG soap. Wear Clean/Comfortable clothing the morning of surgery Do not apply any deodorants/lotions.   Remember to brush your teeth WITH YOUR REGULAR TOOTHPASTE.   Please read over the following fact sheets that you were given.

## 2021-03-14 ENCOUNTER — Encounter (HOSPITAL_COMMUNITY): Payer: Self-pay

## 2021-03-14 ENCOUNTER — Encounter (HOSPITAL_COMMUNITY)
Admission: RE | Admit: 2021-03-14 | Discharge: 2021-03-14 | Disposition: A | Discharge status: Home / Self Care | Payer: 59 | Source: Ambulatory Visit | Attending: Neurological Surgery | Admitting: Neurological Surgery

## 2021-03-14 ENCOUNTER — Encounter: Payer: Self-pay | Admitting: Internal Medicine

## 2021-03-14 ENCOUNTER — Other Ambulatory Visit: Payer: Self-pay

## 2021-03-14 VITALS — BP 153/87 | HR 86 | Temp 98.3°F | Resp 18 | Ht 70.0 in | Wt 189.8 lb

## 2021-03-14 DIAGNOSIS — Z01812 Encounter for preprocedural laboratory examination: Secondary | ICD-10-CM | POA: Diagnosis present

## 2021-03-14 DIAGNOSIS — E1129 Type 2 diabetes mellitus with other diabetic kidney complication: Secondary | ICD-10-CM | POA: Diagnosis not present

## 2021-03-14 DIAGNOSIS — I10 Essential (primary) hypertension: Secondary | ICD-10-CM | POA: Insufficient documentation

## 2021-03-14 DIAGNOSIS — Z01818 Encounter for other preprocedural examination: Secondary | ICD-10-CM

## 2021-03-14 DIAGNOSIS — I428 Other cardiomyopathies: Secondary | ICD-10-CM | POA: Diagnosis not present

## 2021-03-14 DIAGNOSIS — I4892 Unspecified atrial flutter: Secondary | ICD-10-CM | POA: Insufficient documentation

## 2021-03-14 LAB — BASIC METABOLIC PANEL
Anion gap: 11 (ref 5–15)
BUN: 16 mg/dL (ref 6–20)
CO2: 28 mmol/L (ref 22–32)
Calcium: 9.9 mg/dL (ref 8.9–10.3)
Chloride: 100 mmol/L (ref 98–111)
Creatinine, Ser: 1.04 mg/dL (ref 0.61–1.24)
GFR, Estimated: >60 mL/min (ref >60)
Glucose, Bld: 128 mg/dL — ABNORMAL HIGH (ref 70–99)
Potassium: 4.4 mmol/L (ref 3.5–5.1)
Sodium: 139 mmol/L (ref 135–145)

## 2021-03-14 LAB — GLUCOSE, CAPILLARY: Glucose-Capillary: 127 mg/dL — ABNORMAL HIGH (ref 70–99)

## 2021-03-14 LAB — TYPE AND SCREEN
ABO/RH(D): AB POS
Antibody Screen: NEGATIVE

## 2021-03-14 LAB — CBC
HCT: 51.9 % (ref 39.0–52.0)
Hemoglobin: 16.8 g/dL (ref 13.0–17.0)
MCH: 29.5 pg (ref 26.0–34.0)
MCHC: 32.4 g/dL (ref 30.0–36.0)
MCV: 91.2 fL (ref 80.0–100.0)
Platelets: 194 10*3/uL (ref 150–400)
RBC: 5.69 MIL/uL (ref 4.22–5.81)
RDW: 14.4 % (ref 11.5–15.5)
WBC: 13.9 10*3/uL — ABNORMAL HIGH (ref 4.0–10.5)
nRBC: 0 % (ref 0.0–0.2)

## 2021-03-14 LAB — SURGICAL PCR SCREEN
MRSA, PCR: NEGATIVE
Staphylococcus aureus: NEGATIVE

## 2021-03-14 LAB — HEMOGLOBIN A1C
Hgb A1c MFr Bld: 6.4 % — ABNORMAL HIGH (ref 4.8–5.6)
Mean Plasma Glucose: 136.98 mg/dL

## 2021-03-14 NOTE — Progress Notes (Signed)
PCP - Dr. [name] Cardiologist - Dr. Thompson Grayer  (Electrophysiology)  PPM/ICD - denies   Chest x-ray - 11/13/17 EKG - 03/08/21 Stress Test - denies ECHO - 01/10/21 Cardiac Cath - denies  Sleep Study - denies  DM- Type 2 Fasting Blood Sugar - 100-110 Checks Blood Sugar once a day  Blood Thinner Instructions: n/a Aspirin Instructions: n/a  ERAS Protcol - yes, no drink   COVID TEST- pt scheduled for testing on 03/18/21   Anesthesia review: yes, cardiac hx, clearance?  Patient denies shortness of breath, fever, cough and chest pain at PAT appointment   All instructions explained to the patient, with a verbal understanding of the material. Patient agrees to go over the instructions while at home for a better understanding. Patient also instructed to wear a mask in public after being tested for COVID-19. The opportunity to ask questions was provided.

## 2021-03-15 ENCOUNTER — Encounter: Payer: Self-pay | Admitting: Internal Medicine

## 2021-03-15 NOTE — Telephone Encounter (Signed)
Hi Dr. Rayann Heman. Please see Dayna's note below.   Thank you!

## 2021-03-16 NOTE — Telephone Encounter (Signed)
    Patient Name: Paul Gates  DOB: 1961/01/26 MRN: [id number]  Primary Cardiologist: Dr. Rayann Heman  Chart reviewed as part of pre-operative protocol coverage. Patient was recently seen by Dr. Rayann Heman on 03/04/2021 and reported doing well since atrial flutter ablation. He denied any angina, acute CHF symptoms, palpitations, or syncope. He ordered a repeat Echo in 3 months for further monitoring of cardiomyopathy. Discussed with Dr. Rayann Heman and he stated "ok to proceed with surgery without further CV testing if medically indicated."  I will route this recommendation to the requesting party via Epic fax function and remove from pre-op pool.  Please call with questions.  Darreld Mclean, PA-C 03/16/2021, 2:47 PM

## 2021-03-18 ENCOUNTER — Other Ambulatory Visit (HOSPITAL_COMMUNITY)
Admission: RE | Admit: 2021-03-18 | Discharge: 2021-03-18 | Disposition: A | Discharge status: Home / Self Care | Payer: 59 | Source: Ambulatory Visit | Attending: Neurological Surgery | Admitting: Neurological Surgery

## 2021-03-18 DIAGNOSIS — Z01812 Encounter for preprocedural laboratory examination: Secondary | ICD-10-CM | POA: Insufficient documentation

## 2021-03-18 DIAGNOSIS — Z20822 Contact with and (suspected) exposure to covid-19: Secondary | ICD-10-CM | POA: Diagnosis not present

## 2021-03-18 DIAGNOSIS — Z01818 Encounter for other preprocedural examination: Secondary | ICD-10-CM

## 2021-03-18 LAB — SARS CORONAVIRUS 2 (TAT 6-24 HRS): SARS Coronavirus 2: NEGATIVE

## 2021-03-21 NOTE — Anesthesia Preprocedure Evaluation (Addendum)
Anesthesia Evaluation  Patient identified by MRN, date of birth, ID band Patient awake    Reviewed: Allergy & Precautions, NPO status , Patient's Chart, lab work & pertinent test results  Airway Mallampati: II  TM Distance: >3 FB     Dental no notable dental hx.    Pulmonary COPD, Current Smoker and Patient abstained from smoking.,    Pulmonary exam normal        Cardiovascular hypertension, +CHF  + dysrhythmias (s/p ablation) Atrial Fibrillation  Rhythm:Regular Rate:Normal     Neuro/Psych Depression negative neurological ROS     GI/Hepatic Neg liver ROS, GERD  Medicated,  Endo/Other  diabetes, Type 2, Oral Hypoglycemic Agents, Insulin DependentHypothyroidism   Renal/GU   negative genitourinary   Musculoskeletal  (+) Arthritis , Osteoarthritis,  Spinal stenosis   Abdominal Normal abdominal exam  (+)   Peds  Hematology negative hematology ROS (+)   Anesthesia Other Findings   Reproductive/Obstetrics                           Anesthesia Physical Anesthesia Plan  ASA: 3  Anesthesia Plan: General   Post-op Pain Management:    Induction:   PONV Risk Score and Plan: Ondansetron, Dexamethasone, Midazolam and Treatment may vary due to age or medical condition  Airway Management Planned: Mask and Oral ETT  Additional Equipment: Arterial line  Intra-op Plan:   Post-operative Plan: Extubation in OR  Informed Consent: I have reviewed the patients History and Physical, chart, labs and discussed the procedure including the risks, benefits and alternatives for the proposed anesthesia with the patient or authorized representative who has indicated his/her understanding and acceptance.     Dental advisory given  Plan Discussed with: CRNA  Anesthesia Plan Comments: (- CLEAR SIGHT Lab Results      Component                Value               Date                      WBC                       13.9 (H)            03/14/2021                HGB                      16.8                03/14/2021                HCT                      51.9                03/14/2021                MCV                      91.2                03/14/2021                PLT  194                 03/14/2021           Lab Results      Component                Value               Date                      NA                       139                 03/14/2021                K                        4.4                 03/14/2021                CO2                      28                  03/14/2021                GLUCOSE                  128 (H)             03/14/2021                BUN                      16                  03/14/2021                CREATININE               1.04                03/14/2021                CALCIUM                  9.9                 03/14/2021                EGFR                     73                  01/24/2021                GFRNONAA                 >60                 03/14/2021           PAT note by Karoline Caldwell, PA-C: Follows with cardiology for history of a flutter s/p ablation, HTN, and nonischemic cardiomyopathy (EF 45 to 50% by echo 12/2020).  Cardiac clearance per telephone encounter by Sande Rives, PA-C 03/16/2021, "Chart reviewed as  part of pre-operative protocol coverage. Patient was recently seen by Dr. Rayann Heman on 03/04/2021 and reported doing well since atrial flutter ablation. He denied any angina, acute CHF symptoms, palpitations, or syncope. He ordered a repeat Echo in 3 months for further monitoring of cardiomyopathy. Discussed with Dr. Rayann Heman and he stated "ok to proceed with surgery without further CV testing if medically indicated."  Preop labs reviewed, unremarkable. DM2 well controlled, A1c 6.4.  EKG 03/04/21: Sinus rhythm. Rate 72.  TTE 01/10/2021: 1. Left ventricular ejection fraction, by estimation, is 45 to 50%. The   left ventricle has mildly decreased function. The left ventricle  demonstrates global hypokinesis. Left ventricular diastolic parameters are  indeterminate.  2. Right ventricular systolic function is low normal. The right  ventricular size is normal. Tricuspid regurgitation signal is inadequate  for assessing PA pressure.  3. The mitral valve is normal in structure. Trivial mitral valve  regurgitation. No evidence of mitral stenosis.  4. The aortic valve is tricuspid. Aortic valve regurgitation is not  visualized. No aortic stenosis is present.  5. The inferior vena cava is normal in size with <50% respiratory  variability, suggesting right atrial pressure of 8 mmHg.  6. The patient was in atrial flutter.   )      Anesthesia Quick Evaluation

## 2021-03-21 NOTE — Progress Notes (Signed)
Anesthesia Chart Review:  Follows with cardiology for history of a flutter s/p ablation, HTN, and nonischemic cardiomyopathy (EF 45 to 50% by echo 12/2020).  Cardiac clearance per telephone encounter by Sande Rives, PA-C 03/16/2021, "Chart reviewed as part of pre-operative protocol coverage. Patient was recently seen by Dr. Rayann Heman on 03/04/2021 and reported doing well since atrial flutter ablation. He denied any angina, acute CHF symptoms, palpitations, or syncope. He ordered a repeat Echo in 3 months for further monitoring of cardiomyopathy. Discussed with Dr. Rayann Heman and he stated "ok to proceed with surgery without further CV testing if medically indicated."  Preop labs reviewed, unremarkable. DM2 well controlled, A1c 6.4.  EKG 03/04/21: Sinus rhythm. Rate 72.  TTE 01/10/2021:  1. Left ventricular ejection fraction, by estimation, is 45 to 50%. The  left ventricle has mildly decreased function. The left ventricle  demonstrates global hypokinesis. Left ventricular diastolic parameters are  indeterminate.   2. Right ventricular systolic function is low normal. The right  ventricular size is normal. Tricuspid regurgitation signal is inadequate  for assessing PA pressure.   3. The mitral valve is normal in structure. Trivial mitral valve  regurgitation. No evidence of mitral stenosis.   4. The aortic valve is tricuspid. Aortic valve regurgitation is not  visualized. No aortic stenosis is present.   5. The inferior vena cava is normal in size with <50% respiratory  variability, suggesting right atrial pressure of 8 mmHg.   6. The patient was in atrial flutter.    Wynonia Musty [facility] Short Stay Center/Anesthesiology Phone [phone] 03/21/2021 12:45 PM

## 2021-03-22 ENCOUNTER — Encounter (HOSPITAL_COMMUNITY): Payer: Self-pay | Admitting: Neurological Surgery

## 2021-03-22 ENCOUNTER — Inpatient Hospital Stay (HOSPITAL_COMMUNITY): Payer: 59

## 2021-03-22 ENCOUNTER — Other Ambulatory Visit: Payer: Self-pay

## 2021-03-22 ENCOUNTER — Inpatient Hospital Stay (HOSPITAL_COMMUNITY)
Admission: RE | Disposition: A | Discharge status: Home / Self Care | Payer: Self-pay | Source: Home / Self Care | Attending: Neurological Surgery

## 2021-03-22 ENCOUNTER — Inpatient Hospital Stay (HOSPITAL_COMMUNITY): Payer: 59 | Admitting: Anesthesiology

## 2021-03-22 ENCOUNTER — Inpatient Hospital Stay (HOSPITAL_COMMUNITY)
Admission: RE | Admit: 2021-03-22 | Discharge: 2021-03-23 | DRG: 454 | Disposition: A | Discharge status: Home / Self Care | Payer: 59 | Attending: Neurological Surgery | Admitting: Neurological Surgery

## 2021-03-22 ENCOUNTER — Inpatient Hospital Stay (HOSPITAL_COMMUNITY): Payer: 59 | Admitting: Physician Assistant

## 2021-03-22 DIAGNOSIS — E785 Hyperlipidemia, unspecified: Secondary | ICD-10-CM | POA: Diagnosis present

## 2021-03-22 DIAGNOSIS — Z823 Family history of stroke: Secondary | ICD-10-CM

## 2021-03-22 DIAGNOSIS — E039 Hypothyroidism, unspecified: Secondary | ICD-10-CM | POA: Diagnosis present

## 2021-03-22 DIAGNOSIS — Z833 Family history of diabetes mellitus: Secondary | ICD-10-CM | POA: Diagnosis not present

## 2021-03-22 DIAGNOSIS — K219 Gastro-esophageal reflux disease without esophagitis: Secondary | ICD-10-CM | POA: Diagnosis present

## 2021-03-22 DIAGNOSIS — I483 Typical atrial flutter: Secondary | ICD-10-CM | POA: Diagnosis present

## 2021-03-22 DIAGNOSIS — M4726 Other spondylosis with radiculopathy, lumbar region: Secondary | ICD-10-CM | POA: Diagnosis present

## 2021-03-22 DIAGNOSIS — Z87442 Personal history of urinary calculi: Secondary | ICD-10-CM

## 2021-03-22 DIAGNOSIS — E559 Vitamin D deficiency, unspecified: Secondary | ICD-10-CM | POA: Diagnosis present

## 2021-03-22 DIAGNOSIS — Z8041 Family history of malignant neoplasm of ovary: Secondary | ICD-10-CM | POA: Diagnosis not present

## 2021-03-22 DIAGNOSIS — E119 Type 2 diabetes mellitus without complications: Secondary | ICD-10-CM | POA: Diagnosis present

## 2021-03-22 DIAGNOSIS — F32A Depression, unspecified: Secondary | ICD-10-CM | POA: Diagnosis present

## 2021-03-22 DIAGNOSIS — Z981 Arthrodesis status: Secondary | ICD-10-CM

## 2021-03-22 DIAGNOSIS — M48062 Spinal stenosis, lumbar region with neurogenic claudication: Secondary | ICD-10-CM | POA: Diagnosis present

## 2021-03-22 DIAGNOSIS — Z79890 Hormone replacement therapy (postmenopausal): Secondary | ICD-10-CM

## 2021-03-22 DIAGNOSIS — F17210 Nicotine dependence, cigarettes, uncomplicated: Secondary | ICD-10-CM | POA: Diagnosis present

## 2021-03-22 DIAGNOSIS — Z79899 Other long term (current) drug therapy: Secondary | ICD-10-CM

## 2021-03-22 DIAGNOSIS — Z8249 Family history of ischemic heart disease and other diseases of the circulatory system: Secondary | ICD-10-CM

## 2021-03-22 DIAGNOSIS — J449 Chronic obstructive pulmonary disease, unspecified: Secondary | ICD-10-CM | POA: Diagnosis present

## 2021-03-22 DIAGNOSIS — Z801 Family history of malignant neoplasm of trachea, bronchus and lung: Secondary | ICD-10-CM | POA: Diagnosis not present

## 2021-03-22 DIAGNOSIS — Z7984 Long term (current) use of oral hypoglycemic drugs: Secondary | ICD-10-CM

## 2021-03-22 DIAGNOSIS — I10 Essential (primary) hypertension: Secondary | ICD-10-CM | POA: Diagnosis present

## 2021-03-22 DIAGNOSIS — I428 Other cardiomyopathies: Secondary | ICD-10-CM | POA: Diagnosis present

## 2021-03-22 DIAGNOSIS — Z419 Encounter for procedure for purposes other than remedying health state, unspecified: Secondary | ICD-10-CM

## 2021-03-22 LAB — GLUCOSE, CAPILLARY
Glucose-Capillary: 147 mg/dL — ABNORMAL HIGH (ref 70–99)
Glucose-Capillary: 187 mg/dL — ABNORMAL HIGH (ref 70–99)
Glucose-Capillary: 205 mg/dL — ABNORMAL HIGH (ref 70–99)

## 2021-03-22 SURGERY — POSTERIOR LUMBAR FUSION 2 LEVEL
Anesthesia: General | Site: Back

## 2021-03-22 MED ORDER — PHENOL 1.4 % MT LIQD: 1.0000 | OROMUCOSAL | Status: DC | PRN

## 2021-03-22 MED ORDER — BUPIVACAINE HCL (PF) 0.5 % IJ SOLN
INTRAMUSCULAR | Status: AC
  Filled 2021-03-22: qty 30

## 2021-03-22 MED ORDER — ONDANSETRON HCL 4 MG/2ML IJ SOLN: 4.0000 mg | Freq: Four times a day (QID) | INTRAMUSCULAR | Status: DC | PRN

## 2021-03-22 MED ORDER — FENTANYL CITRATE (PF) 250 MCG/5ML IJ SOLN
INTRAMUSCULAR | Status: DC | PRN
  Administered 2021-03-22: 50 ug via INTRAVENOUS
  Administered 2021-03-22: 100 ug via INTRAVENOUS

## 2021-03-22 MED ORDER — DEXMEDETOMIDINE (PRECEDEX) IN NS 20 MCG/5ML (4 MCG/ML) IV SYRINGE
PREFILLED_SYRINGE | INTRAVENOUS | Status: AC
  Filled 2021-03-22: qty 5

## 2021-03-22 MED ORDER — FENTANYL CITRATE (PF) 100 MCG/2ML IJ SOLN
25.0000 ug | INTRAMUSCULAR | Status: DC | PRN
  Administered 2021-03-22 (×2): 50 ug via INTRAVENOUS

## 2021-03-22 MED ORDER — METHOCARBAMOL 1000 MG/10ML IJ SOLN
500.0000 mg | Freq: Four times a day (QID) | INTRAVENOUS | Status: DC | PRN
  Administered 2021-03-23: 500 mg via INTRAVENOUS
  Filled 2021-03-22: qty 5

## 2021-03-22 MED ORDER — THROMBIN 20000 UNITS EX SOLR
CUTANEOUS | Status: AC
  Filled 2021-03-22: qty 20000

## 2021-03-22 MED ORDER — KETAMINE HCL 10 MG/ML IJ SOLN
INTRAMUSCULAR | Status: DC | PRN
  Administered 2021-03-22 (×2): 10 mg via INTRAVENOUS
  Administered 2021-03-22: 5 mg via INTRAVENOUS
  Administered 2021-03-22: 25 mg via INTRAVENOUS

## 2021-03-22 MED ORDER — LIDOCAINE 2% (20 MG/ML) 5 ML SYRINGE
INTRAMUSCULAR | Status: AC
  Filled 2021-03-22: qty 5

## 2021-03-22 MED ORDER — THROMBIN 5000 UNITS EX SOLR
OROMUCOSAL | Status: DC | PRN
  Administered 2021-03-22: 5 mL via TOPICAL

## 2021-03-22 MED ORDER — THROMBIN 20000 UNITS EX SOLR
CUTANEOUS | Status: DC | PRN
  Administered 2021-03-22: 20 mL via TOPICAL

## 2021-03-22 MED ORDER — TAMSULOSIN HCL 0.4 MG PO CAPS: 0.4000 mg | ORAL_CAPSULE | Freq: Every day | ORAL | Status: DC

## 2021-03-22 MED ORDER — LACTATED RINGERS IV SOLN: INTRAVENOUS | Status: DC

## 2021-03-22 MED ORDER — CHLORHEXIDINE GLUCONATE CLOTH 2 % EX PADS: 6.0000 | MEDICATED_PAD | Freq: Once | CUTANEOUS | Status: DC

## 2021-03-22 MED ORDER — LIDOCAINE-EPINEPHRINE 1 %-1:100000 IJ SOLN
INTRAMUSCULAR | Status: DC | PRN
  Administered 2021-03-22: 5 mL

## 2021-03-22 MED ORDER — ROCURONIUM BROMIDE 10 MG/ML (PF) SYRINGE
PREFILLED_SYRINGE | INTRAVENOUS | Status: AC
  Filled 2021-03-22: qty 10

## 2021-03-22 MED ORDER — POLYETHYLENE GLYCOL 3350 17 G PO PACK: 17.0000 g | PACK | Freq: Every day | ORAL | Status: DC | PRN

## 2021-03-22 MED ORDER — PROPOFOL 500 MG/50ML IV EMUL
INTRAVENOUS | Status: DC | PRN
  Administered 2021-03-22: 20 ug/kg/min via INTRAVENOUS

## 2021-03-22 MED ORDER — METHOCARBAMOL 500 MG PO TABS
500.0000 mg | ORAL_TABLET | Freq: Four times a day (QID) | ORAL | Status: DC | PRN
  Administered 2021-03-22 (×2): 500 mg via ORAL

## 2021-03-22 MED ORDER — MORPHINE SULFATE (PF) 2 MG/ML IV SOLN
2.0000 mg | INTRAVENOUS | Status: DC | PRN
  Administered 2021-03-22: 2 mg via INTRAVENOUS
  Administered 2021-03-22: 4 mg via INTRAVENOUS

## 2021-03-22 MED ORDER — DEXAMETHASONE SODIUM PHOSPHATE 10 MG/ML IJ SOLN
INTRAMUSCULAR | Status: AC
  Filled 2021-03-22: qty 1

## 2021-03-22 MED ORDER — MENTHOL 3 MG MT LOZG: 1.0000 | LOZENGE | OROMUCOSAL | Status: DC | PRN

## 2021-03-22 MED ORDER — KETAMINE HCL 50 MG/5ML IJ SOSY
PREFILLED_SYRINGE | INTRAMUSCULAR | Status: AC
  Filled 2021-03-22: qty 5

## 2021-03-22 MED ORDER — ONDANSETRON HCL 4 MG PO TABS: 4.0000 mg | ORAL_TABLET | Freq: Four times a day (QID) | ORAL | Status: DC | PRN

## 2021-03-22 MED ORDER — DEXAMETHASONE SODIUM PHOSPHATE 10 MG/ML IJ SOLN
INTRAMUSCULAR | Status: DC | PRN
  Administered 2021-03-22: 5 mg via INTRAVENOUS

## 2021-03-22 MED ORDER — LEVOTHYROXINE SODIUM 50 MCG PO TABS
50.0000 ug | ORAL_TABLET | Freq: Every day | ORAL | Status: DC
  Administered 2021-03-23: 50 ug via ORAL
  Filled 2021-03-22: qty 1
  Filled 2021-03-22: qty 2

## 2021-03-22 MED ORDER — FENTANYL CITRATE (PF) 100 MCG/2ML IJ SOLN
INTRAMUSCULAR | Status: AC
  Filled 2021-03-22: qty 2

## 2021-03-22 MED ORDER — METFORMIN HCL ER 500 MG PO TB24
500.0000 mg | ORAL_TABLET | Freq: Three times a day (TID) | ORAL | Status: DC
  Administered 2021-03-22 – 2021-03-23 (×2): 500 mg via ORAL
  Filled 2021-03-22: qty 1

## 2021-03-22 MED ORDER — ACETAMINOPHEN 10 MG/ML IV SOLN
INTRAVENOUS | Status: AC
  Filled 2021-03-22: qty 100

## 2021-03-22 MED ORDER — ONDANSETRON HCL 4 MG/2ML IJ SOLN
INTRAMUSCULAR | Status: DC | PRN
  Administered 2021-03-22: 4 mg via INTRAVENOUS

## 2021-03-22 MED ORDER — BUPIVACAINE HCL (PF) 0.5 % IJ SOLN
INTRAMUSCULAR | Status: DC | PRN
  Administered 2021-03-22: 20 mL
  Administered 2021-03-22: 5 mL

## 2021-03-22 MED ORDER — LORATADINE 10 MG PO TABS
10.0000 mg | ORAL_TABLET | Freq: Every day | ORAL | Status: DC
  Administered 2021-03-22: 10 mg via ORAL

## 2021-03-22 MED ORDER — SUGAMMADEX SODIUM 200 MG/2ML IV SOLN
INTRAVENOUS | Status: DC | PRN
  Administered 2021-03-22: 200 mg via INTRAVENOUS

## 2021-03-22 MED ORDER — OMEPRAZOLE MAGNESIUM 20 MG PO TBEC: 20.0000 mg | DELAYED_RELEASE_TABLET | Freq: Every day | ORAL | Status: DC

## 2021-03-22 MED ORDER — SODIUM CHLORIDE 0.9% FLUSH: 3.0000 mL | Freq: Two times a day (BID) | INTRAVENOUS | Status: DC

## 2021-03-22 MED ORDER — SODIUM CHLORIDE 0.9 % IV SOLN: INTRAVENOUS | Status: DC | PRN

## 2021-03-22 MED ORDER — DOCUSATE SODIUM 100 MG PO CAPS
100.0000 mg | ORAL_CAPSULE | Freq: Two times a day (BID) | ORAL | Status: DC
  Administered 2021-03-22: 100 mg via ORAL

## 2021-03-22 MED ORDER — SODIUM CHLORIDE 0.9% FLUSH: 3.0000 mL | INTRAVENOUS | Status: DC | PRN

## 2021-03-22 MED ORDER — SENNA 8.6 MG PO TABS
1.0000 | ORAL_TABLET | Freq: Two times a day (BID) | ORAL | Status: DC
  Administered 2021-03-22: 8.6 mg via ORAL

## 2021-03-22 MED ORDER — ACETAMINOPHEN 325 MG PO TABS: 650.0000 mg | ORAL_TABLET | ORAL | Status: DC | PRN

## 2021-03-22 MED ORDER — ACETAMINOPHEN 500 MG PO TABS: 1000.0000 mg | ORAL_TABLET | Freq: Four times a day (QID) | ORAL | Status: DC | PRN

## 2021-03-22 MED ORDER — BISACODYL 10 MG RE SUPP: 10.0000 mg | Freq: Every day | RECTAL | Status: DC | PRN

## 2021-03-22 MED ORDER — CEFAZOLIN SODIUM 1 G IJ SOLR
INTRAMUSCULAR | Status: AC
  Filled 2021-03-22: qty 20

## 2021-03-22 MED ORDER — SODIUM CHLORIDE 0.9 % IV SOLN
250.0000 mL | INTRAVENOUS | Status: DC
  Administered 2021-03-22: 250 mL via INTRAVENOUS

## 2021-03-22 MED ORDER — THROMBIN 5000 UNITS EX SOLR
CUTANEOUS | Status: AC
  Filled 2021-03-22: qty 5000

## 2021-03-22 MED ORDER — PHENYLEPHRINE HCL-NACL 20-0.9 MG/250ML-% IV SOLN
INTRAVENOUS | Status: DC | PRN
  Administered 2021-03-22: 40 ug/min via INTRAVENOUS

## 2021-03-22 MED ORDER — CHLORHEXIDINE GLUCONATE 0.12 % MT SOLN
OROMUCOSAL | Status: AC
  Filled 2021-03-22: qty 15

## 2021-03-22 MED ORDER — FLEET ENEMA 7-19 GM/118ML RE ENEM: 1.0000 | ENEMA | Freq: Once | RECTAL | Status: DC | PRN

## 2021-03-22 MED ORDER — CEFAZOLIN SODIUM-DEXTROSE 2-4 GM/100ML-% IV SOLN
INTRAVENOUS | Status: AC
  Filled 2021-03-22: qty 100

## 2021-03-22 MED ORDER — MIDAZOLAM HCL 2 MG/2ML IJ SOLN
INTRAMUSCULAR | Status: AC
  Filled 2021-03-22: qty 2

## 2021-03-22 MED ORDER — CEFAZOLIN SODIUM-DEXTROSE 2-4 GM/100ML-% IV SOLN
2.0000 g | Freq: Three times a day (TID) | INTRAVENOUS | Status: AC
  Administered 2021-03-22 – 2021-03-23 (×2): 2 g via INTRAVENOUS
  Filled 2021-03-22: qty 100

## 2021-03-22 MED ORDER — MIDAZOLAM HCL 2 MG/2ML IJ SOLN
INTRAMUSCULAR | Status: DC | PRN
  Administered 2021-03-22: 2 mg via INTRAVENOUS

## 2021-03-22 MED ORDER — ORAL CARE MOUTH RINSE: 15.0000 mL | Freq: Once | OROMUCOSAL | Status: AC

## 2021-03-22 MED ORDER — CEFAZOLIN SODIUM-DEXTROSE 2-4 GM/100ML-% IV SOLN
2.0000 g | INTRAVENOUS | Status: AC
  Administered 2021-03-22 (×2): 2 g via INTRAVENOUS

## 2021-03-22 MED ORDER — 0.9 % SODIUM CHLORIDE (POUR BTL) OPTIME
TOPICAL | Status: DC | PRN
  Administered 2021-03-22: 1000 mL

## 2021-03-22 MED ORDER — LOSARTAN POTASSIUM 25 MG PO TABS
25.0000 mg | ORAL_TABLET | Freq: Every day | ORAL | Status: DC
  Administered 2021-03-22: 25 mg via ORAL
  Filled 2021-03-22 (×2): qty 1

## 2021-03-22 MED ORDER — ACETAMINOPHEN 10 MG/ML IV SOLN: 1000.0000 mg | Freq: Once | INTRAVENOUS | Status: DC | PRN

## 2021-03-22 MED ORDER — ONDANSETRON HCL 4 MG/2ML IJ SOLN
INTRAMUSCULAR | Status: AC
  Filled 2021-03-22: qty 2

## 2021-03-22 MED ORDER — TRAMADOL HCL 50 MG PO TABS: 50.0000 mg | ORAL_TABLET | Freq: Four times a day (QID) | ORAL | Status: DC | PRN

## 2021-03-22 MED ORDER — PROPOFOL 10 MG/ML IV BOLUS
INTRAVENOUS | Status: DC | PRN
  Administered 2021-03-22: 190 mg via INTRAVENOUS

## 2021-03-22 MED ORDER — PROMETHAZINE HCL 25 MG/ML IJ SOLN: 6.2500 mg | INTRAMUSCULAR | Status: DC | PRN

## 2021-03-22 MED ORDER — EPHEDRINE SULFATE 50 MG/ML IJ SOLN
INTRAMUSCULAR | Status: DC | PRN
  Administered 2021-03-22 (×2): 5 mg via INTRAVENOUS
  Administered 2021-03-22: 10 mg via INTRAVENOUS

## 2021-03-22 MED ORDER — FENTANYL CITRATE (PF) 250 MCG/5ML IJ SOLN
INTRAMUSCULAR | Status: AC
  Filled 2021-03-22: qty 5

## 2021-03-22 MED ORDER — PANTOPRAZOLE SODIUM 40 MG PO TBEC
40.0000 mg | DELAYED_RELEASE_TABLET | Freq: Every day | ORAL | Status: DC
  Administered 2021-03-22: 40 mg via ORAL

## 2021-03-22 MED ORDER — LIDOCAINE-EPINEPHRINE 1 %-1:100000 IJ SOLN
INTRAMUSCULAR | Status: AC
  Filled 2021-03-22: qty 1

## 2021-03-22 MED ORDER — PROPOFOL 10 MG/ML IV BOLUS
INTRAVENOUS | Status: AC
  Filled 2021-03-22: qty 20

## 2021-03-22 MED ORDER — CYCLOBENZAPRINE HCL 10 MG PO TABS: 10.0000 mg | ORAL_TABLET | Freq: Every day | ORAL | Status: DC | PRN

## 2021-03-22 MED ORDER — CHLORHEXIDINE GLUCONATE 0.12 % MT SOLN
15.0000 mL | Freq: Once | OROMUCOSAL | Status: AC
  Administered 2021-03-22: 15 mL via OROMUCOSAL

## 2021-03-22 MED ORDER — ACETAMINOPHEN 10 MG/ML IV SOLN
INTRAVENOUS | Status: DC | PRN
  Administered 2021-03-22: 1000 mg via INTRAVENOUS

## 2021-03-22 MED ORDER — INSULIN ASPART 100 UNIT/ML IJ SOLN: 0.0000 [IU] | Freq: Three times a day (TID) | INTRAMUSCULAR | Status: DC

## 2021-03-22 MED ORDER — DEXMEDETOMIDINE (PRECEDEX) IN NS 20 MCG/5ML (4 MCG/ML) IV SYRINGE
PREFILLED_SYRINGE | INTRAVENOUS | Status: DC | PRN
  Administered 2021-03-22: 8 ug via INTRAVENOUS

## 2021-03-22 MED ORDER — ROCURONIUM BROMIDE 10 MG/ML (PF) SYRINGE
PREFILLED_SYRINGE | INTRAVENOUS | Status: DC | PRN
  Administered 2021-03-22 (×2): 20 mg via INTRAVENOUS
  Administered 2021-03-22 (×3): 30 mg via INTRAVENOUS
  Administered 2021-03-22: 20 mg via INTRAVENOUS
  Administered 2021-03-22: 30 mg via INTRAVENOUS
  Administered 2021-03-22: 70 mg via INTRAVENOUS

## 2021-03-22 MED ORDER — ACETAMINOPHEN 650 MG RE SUPP: 650.0000 mg | RECTAL | Status: DC | PRN

## 2021-03-22 MED ORDER — ALUM & MAG HYDROXIDE-SIMETH 200-200-20 MG/5ML PO SUSP: 30.0000 mL | Freq: Four times a day (QID) | ORAL | Status: DC | PRN

## 2021-03-22 MED ORDER — LIDOCAINE 2% (20 MG/ML) 5 ML SYRINGE
INTRAMUSCULAR | Status: DC | PRN
  Administered 2021-03-22: 80 mg via INTRAVENOUS

## 2021-03-22 MED ORDER — OXYCODONE-ACETAMINOPHEN 5-325 MG PO TABS
1.0000 | ORAL_TABLET | ORAL | Status: DC | PRN
  Administered 2021-03-22 – 2021-03-23 (×4): 2 via ORAL
  Filled 2021-03-22 (×2): qty 2

## 2021-03-22 SURGICAL SUPPLY — 68 items
BAG COUNTER SPONGE SURGICOUNT (BAG) ×2 IMPLANT
BASKET BONE COLLECTION (BASKET) ×2 IMPLANT
BLADE CLIPPER SURG (BLADE) ×2 IMPLANT
BONE CANC CHIPS 40CC CAN1/2 (Bone Implant) ×2 IMPLANT
BUR MATCHSTICK NEURO 3.0 LAGG (BURR) ×2 IMPLANT
CAGE COROENT LG 10X9X23-12 (Cage) ×4 IMPLANT
CAGE COROENT PLIF 10X28-8 LUMB (Cage) ×4 IMPLANT
CANISTER SUCT 3000ML PPV (MISCELLANEOUS) ×2 IMPLANT
CHIPS CANC BONE 40CC CAN1/2 (Bone Implant) ×1 IMPLANT
CNTNR URN SCR LID CUP LEK RST (MISCELLANEOUS) ×1 IMPLANT
CONT SPEC 4OZ STRL OR WHT (MISCELLANEOUS) ×2
COVER BACK TABLE 60X90IN (DRAPES) ×2 IMPLANT
DECANTER SPIKE VIAL GLASS SM (MISCELLANEOUS) ×2 IMPLANT
DERMABOND ADVANCED (GAUZE/BANDAGES/DRESSINGS) ×1
DERMABOND ADVANCED .7 DNX12 (GAUZE/BANDAGES/DRESSINGS) ×1 IMPLANT
DEVICE DISSECT PLASMABLAD 3.0S (MISCELLANEOUS) ×1 IMPLANT
DRAPE C-ARM 42X72 X-RAY (DRAPES) ×2 IMPLANT
DRAPE C-ARMOR (DRAPES) ×2 IMPLANT
DRAPE HALF SHEET 40X57 (DRAPES) IMPLANT
DRAPE LAPAROTOMY 100X72X124 (DRAPES) ×2 IMPLANT
DRSG OPSITE POSTOP 4X8 (GAUZE/BANDAGES/DRESSINGS) ×2 IMPLANT
DURAPREP 26ML APPLICATOR (WOUND CARE) ×2 IMPLANT
DURASEAL APPLICATOR TIP (TIP) IMPLANT
DURASEAL SPINE SEALANT 3ML (MISCELLANEOUS) IMPLANT
ELECT REM PT RETURN 9FT ADLT (ELECTROSURGICAL) ×2
ELECTRODE REM PT RTRN 9FT ADLT (ELECTROSURGICAL) ×1 IMPLANT
GAUZE 4X4 16PLY ~~LOC~~+RFID DBL (SPONGE) ×2 IMPLANT
GAUZE SPONGE 4X4 12PLY STRL (GAUZE/BANDAGES/DRESSINGS) ×2 IMPLANT
GLOVE SURG LTX SZ7.5 (GLOVE) ×4 IMPLANT
GLOVE SURG LTX SZ8.5 (GLOVE) ×4 IMPLANT
GLOVE SURG UNDER POLY LF SZ7 (GLOVE) ×2 IMPLANT
GLOVE SURG UNDER POLY LF SZ7.5 (GLOVE) ×12 IMPLANT
GLOVE SURG UNDER POLY LF SZ8.5 (GLOVE) ×4 IMPLANT
GOWN STRL REUS W/ TWL LRG LVL3 (GOWN DISPOSABLE) ×2 IMPLANT
GOWN STRL REUS W/ TWL XL LVL3 (GOWN DISPOSABLE) ×4 IMPLANT
GOWN STRL REUS W/TWL 2XL LVL3 (GOWN DISPOSABLE) ×4 IMPLANT
GOWN STRL REUS W/TWL LRG LVL3 (GOWN DISPOSABLE) ×2
GOWN STRL REUS W/TWL XL LVL3 (GOWN DISPOSABLE) ×4
GRAFT BONE PROTEIOS LRG 5CC (Orthopedic Implant) ×2 IMPLANT
HEMOSTAT POWDER KIT SURGIFOAM (HEMOSTASIS) ×2 IMPLANT
KIT BASIN OR (CUSTOM PROCEDURE TRAY) ×2 IMPLANT
KIT GRAFTMAG DEL NEURO DISP (NEUROSURGERY SUPPLIES) ×2 IMPLANT
KIT TURNOVER KIT B (KITS) ×2 IMPLANT
MILL MEDIUM DISP (BLADE) IMPLANT
NEEDLE HYPO 22GX1.5 SAFETY (NEEDLE) ×2 IMPLANT
NEEDLE SPNL 18GX3.5 QUINCKE PK (NEEDLE) IMPLANT
NS IRRIG 1000ML POUR BTL (IV SOLUTION) ×2 IMPLANT
PACK LAMINECTOMY NEURO (CUSTOM PROCEDURE TRAY) ×2 IMPLANT
PAD ARMBOARD 7.5X6 YLW CONV (MISCELLANEOUS) ×4 IMPLANT
PATTIES SURGICAL .5 X1 (DISPOSABLE) ×2 IMPLANT
PLASMABLADE 3.0S (MISCELLANEOUS) ×2
ROD RELINE-O LORD 5.5X110MM (Rod) ×4 IMPLANT
SCREW LOCK RELINE 5.5 TULIP (Screw) ×16 IMPLANT
SCREW RELINE-O POLY 6.5X45 (Screw) ×16 IMPLANT
SPONGE SURGIFOAM ABS GEL 100 (HEMOSTASIS) ×2 IMPLANT
SPONGE T-LAP 4X18 ~~LOC~~+RFID (SPONGE) ×4 IMPLANT
SUT PROLENE 6 0 BV (SUTURE) IMPLANT
SUT VIC AB 1 CT1 18XBRD ANBCTR (SUTURE) ×1 IMPLANT
SUT VIC AB 1 CT1 8-18 (SUTURE) ×1
SUT VIC AB 2-0 CP2 18 (SUTURE) ×2 IMPLANT
SUT VIC AB 3-0 SH 8-18 (SUTURE) ×2 IMPLANT
SUT VIC AB 4-0 RB1 18 (SUTURE) ×2 IMPLANT
SYR 3ML LL SCALE MARK (SYRINGE) IMPLANT
SYR 5ML LL (SYRINGE) ×2 IMPLANT
TOWEL GREEN STERILE (TOWEL DISPOSABLE) ×2 IMPLANT
TOWEL GREEN STERILE FF (TOWEL DISPOSABLE) ×2 IMPLANT
TRAY FOLEY MTR SLVR 16FR STAT (SET/KITS/TRAYS/PACK) ×2 IMPLANT
WATER STERILE IRR 1000ML POUR (IV SOLUTION) ×2 IMPLANT

## 2021-03-22 NOTE — Anesthesia Procedure Notes (Addendum)
Procedure Name: Intubation Date/Time: 03/22/2021 8:00 AM Performed by: Janace Litten, CRNA Pre-anesthesia Checklist: Patient identified, Emergency Drugs available, Suction available and Patient being monitored Patient Re-evaluated:Patient Re-evaluated prior to induction Oxygen Delivery Method: Circle System Utilized Preoxygenation: Pre-oxygenation with 100% oxygen Induction Type: IV induction Ventilation: Mask ventilation without difficulty and Oral airway inserted - appropriate to patient size Laryngoscope Size: Mac and 4 Grade View: Grade II Tube type: Oral Tube size: 7.5 mm Number of attempts: 1 Airway Equipment and Method: Stylet and Oral airway Placement Confirmation: ETT inserted through vocal cords under direct vision, positive ETCO2 and breath sounds checked- equal and bilateral Secured at: 24 cm Tube secured with: Tape Dental Injury: Teeth and Oropharynx as per pre-operative assessment  Comments: Intubated by Drucie Opitz, Fernanda Drum

## 2021-03-22 NOTE — Op Note (Signed)
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  History of fusion L3-4 Postoperative diagnosis same Procedure: Laminectomy L2-3 and L4-5 with decompression of the L2-3 and L4-5 spaces with more work than required for simple interbody technique.  Posterior lumbar interbody arthrodesis using peek spacers local autograft allograft and Proteus.  Posterolateral arthrodesis with local autograft allograft and Proteus L2-3 and L4-5.  Revision of posterior hardware with pedicle fixation from L2-L5.  Repositioning of left L4 pedicle screw.  Replacement of spiral 90 D hardware with NuVasive pedicle screws. Surgeon: Kristeen Miss First Assistant: Duffy Rhody, MD Anesthesia: General endotracheal Indications: Paul Gates is a 60 year old individual who underwent surgical decompression and fusion at L3-L4 in 2001.  He done well over the last number of years but over the past several years that developed progressive radicular symptoms with some neurogenic claudication symptoms that have become severe he has severe stenosis at the level of L4-L5 and moderately severe stenosis at L2-L3.  Has been advised regarding revision surgery to include decompression arthrodesis at L2-3 and L4-5.  Procedure: Patient was brought to the operating room supine on the stretcher.  After the smooth induction of general endotracheal anesthesia, he was carefully turned prone.  Back was prepped with alcohol DuraPrep and draped in a sterile fashion.  Midline incision was created and carried down to the lumbodorsal fascia to expose the region of the old hardware.  Then the dissection was carried superiorly and inferiorly to expose both L4-5 and L2-3.  The previously placed hardware was then skeletonized and removed.  It was noted that the left-sided pedicle screw at L4 was placed superiorly and the pedicle screw hole was then repositioned at this time.  Attention was then turned to L4-5  with bilateral laminotomies were created removing the inferior margin lamina of L4 out to and including the entirety of the facet.  This was done bilaterally.  The thickened redundant ligamentous tissue in this area was removed and this provided good decompression of the central canal.  The path of the L4 nerve root superiorly and the L5 nerve root inferiorly were then decompressed using a 2 and 3 mm Kerrison punch and high-speed drill.  Once the disc space was isolated in this region a discectomy was performed at L4-5 and combination of curettes rongeurs and disc shavers were used to remove the largest quantity of disc material from this space.  This discectomy was done bilaterally and then a toothed curette was used to remove the endplate material to provide a good grafting surface at L4-L5.  Once the disc space was evacuated of all the disc material it was sized for an appropriate size spacer and it was felt that a 10 x 9 x 23 mm spacer with 12 degrees lordosis would fit best to the spacers were then packed with autograft allograft and Proteus and a total of 15 cc of bone graft was packed into the disc space.  The spacers were placed ventrally.  Once this was accomplished pedicle entry sites were chosen at L5 and these were marked with a pedicle probe.  Next we turned our attention to L2-3 where a laminectomy of L2 was completed and dissection was carried out removing thickened redundant ligamentous material.  The common dural tube was decompressed and there was noted to be a substantial bulge of the disc centrally.  The disc space was then incised and a complete discectomy was performed at L2-3 in a similar fashion to that at L4-5.  Here the interspace was sized and was felt ultimately that a 10 x 9 x 23 mm spacer with 8 degrees lordosis would fit best into the interval.  To the spacers were then filled with bone graft and a total of 12 cc of bone graft was placed into the interspace.  Once the interbody  arthrodesis was performed pedicle entry sites were chosen at the L2 vertebrae and 6.5 x 45 mm screws were placed in to the pedicles at L2-3 using fluoroscopic guidance 6.5 x 45 mm screws were then placed at L5 the reposition screw was checked radiographically for the proper probe placement and then 6.5 x 45 mm screws were placed in L3 and L4.  110 mm long precontoured rods were then used to secure the screws from L2-L5.  Lateral gutters which had been previously decorticated were then packed with an additional 6 cc of bone graft at L2-3 on each side and 6 cc of bone graft at L4-5 on each side.  Once the bone graft was placed system was then tightened in a neutral construct and final radiographs were obtained checking good alignment of the lumbar spine and good height of the intervertebral spaces.  Then we carefully probed the exiting nerve roots at L2-L3-L4 and L5 make sure that there were free and clear and when verified the wound was closed with #1 Vicryl and the lumbodorsal fascia 2-0 Vicryl subcutaneous tissues.  25 cc of half percent Marcaine was injected into the paraspinous fascia.  The scalp subcuticular tissue and the skin was closed with 3-0 Vicryl and 4-0 Vicryl respectively.  Dermabond was placed on the skin blood loss for the procedure was estimated at 600 cc and 250 cc of Cell Saver blood was returned to the patient.  Patient tolerated procedure well.

## 2021-03-22 NOTE — Transfer of Care (Signed)
Immediate Anesthesia Transfer of Care Note  Patient: Paul Gates  Procedure(s) Performed: Removal hardware previous fusion : Lumbar two-three, Lumbar four-five Posterior Lumbar Interbody Fusion with Pedicle screw fixation Lumbar two-five (Back)  Patient Location: PACU  Anesthesia Type:General  Level of Consciousness: drowsy, patient cooperative and responds to stimulation  Airway & Oxygen Therapy: Patient Spontanous Breathing and Patient connected to face mask oxygen  Post-op Assessment: Report given to RN and Post -op Vital signs reviewed and stable  Post vital signs: Reviewed and stable  Last Vitals:  Vitals Value Taken Time  BP    Temp    Pulse    Resp    SpO2      Last Pain:  Vitals:   03/22/21 0613  TempSrc:   PainSc: 4       Patients Stated Pain Goal: 3 (29/93/71 [id number])  Complications: No notable events documented.

## 2021-03-22 NOTE — Progress Notes (Signed)
Orthopedic Tech Progress Note Patient Details:  HELDER CRISAFULLI 03-23-1961 [id number]  PACU RN called requesting a LSO BACK BRACE   Ortho Devices Type of Ortho Device: Lumbar corsett Ortho Device/Splint Location: BACK Ortho Device/Splint Interventions: Ordered   Post Interventions Patient Tolerated: Well Instructions Provided: Care of [facility] 03/22/2021, 4:08 PM

## 2021-03-22 NOTE — H&P (Signed)
Paul Gates is an 60 y.o. male.   Chief Complaint: Back and bilateral leg pain worsening over the past 5 years HPI: Paul Gates is a 60 year old individual who underwent surgical decompression and stabilization at L3-4 in 2001.  Patient did well for nearly 15 years and then started to develop some recurrent symptoms.  In 2014 and he underwent an MRI because of some of the symptoms and was found to have a synovial cyst at the L5-S1 level.  This gradually regressed but he started having recurrent symptoms a few years later and now has significant back and bilateral lower extremity symptoms such that is hard for him to walk distances stand in any 1 position for any length of time more than 2 or 3 minutes and sitting over period of time becomes increasingly uncomfortable such that he is finding himself constantly changing positions.  Work-up demonstrates that the patient now has adjacent level disease at L2-3 and L4-5 with a severe stenosis at L4-5 moderate stenosis at L2-3.  L5-S1 appears to be fairly well-preserved with some facet arthropathy but no nerve root compromise.  After careful consideration of his options I advised surgical decompression and stabilization at L2-3 and L4-5 he is now admitted for that procedure.  Past Medical History:  Diagnosis Date   COPD (chronic obstructive pulmonary disease) ([place])    DDD (degenerative disc disease)    Depression 10/09/2013   Diabetes mellitus without complication (HCC)    GERD (gastroesophageal reflux disease)    Heart murmur    1971- per pt no issues reported by cardiology about murmur- Echo done   History of kidney stones    Hyperlipidemia    Hypertension    Hypogonadism male    Hypothyroidism    Typical atrial flutter ([place]) 2022   Vitamin D deficiency     Past Surgical History:  Procedure Laterality Date   A-FLUTTER ABLATION N/A 02/01/2021   Procedure: A-FLUTTER ABLATION;  Surgeon: Thompson Grayer, MD;  Location: [facility];   Service: Cardiovascular;  Laterality: N/A;   ASD REPAIR  1971   open heart   CERVICAL FUSION  1991   ESOPHAGOGASTRODUODENOSCOPY  1987,1992,1993,1995   duodenal ulcer   LUMBAR DISC SURGERY     LUMBAR FUSION  2001   LUMBAR LAMINECTOMY  7/97    Family History  Problem Relation Age of Onset   Hypertension Mother    Stroke Mother    Cancer Mother        ovarian   Alcohol abuse Father    Hypertension Father    Clotting disorder Father        antiphospholipid syndrome    Diabetes Maternal Uncle    Cancer Maternal Grandmother        lung   Cancer Paternal Grandfather        lung   Colon cancer Neg Hx    Stomach cancer Neg Hx    Social History:  reports that he has been smoking cigarettes. He started smoking about 42 years ago. He has been smoking an average of .25 packs per day. He has never used smokeless tobacco. He reports current alcohol use of about 4.0 standard drinks per week. He reports that he does not use drugs.  Allergies: No Known Allergies  Medications Prior to Admission  Medication Sig Dispense Refill   acetaminophen (TYLENOL) 500 MG tablet Take 1,000 mg by mouth every 6 (six) hours as needed for moderate pain.     cetirizine (ZYRTEC) 10  MG tablet Take 10 mg by mouth daily.     Cholecalciferol (VITAMIN D-3) 5000 UNITS TABS Take 5,000 Units by mouth daily.     ibuprofen (ADVIL) 200 MG tablet Take 600 mg by mouth every 6 (six) hours as needed for moderate pain.     levothyroxine (SYNTHROID, LEVOTHROID) 50 MCG tablet TAKE 1 TABLET BY MOUTH ONCE DAILY BEFORE BREAKFAST (Patient taking differently: Take 50 mcg by mouth daily before breakfast.) 90 tablet 2   losartan (COZAAR) 25 MG tablet Take 1 tablet (25 mg total) by mouth daily. 90 tablet 3   metFORMIN (GLUCOPHAGE-XR) 500 MG 24 hr tablet Take 1 tablet (500 mg total) by mouth 3 (three) times daily with meals. 270 tablet 1   omeprazole (PRILOSEC OTC) 20 MG tablet Take 20 mg by mouth daily.     tamsulosin (FLOMAX) 0.4 MG  CAPS capsule Take 0.4 mg by mouth daily.     testosterone cypionate (DEPOTESTOSTERONE CYPIONATE) 200 MG/ML injection USE 2ML INTRAMUSCULARLY EVERY 2 WEEKS AS DIRECTED 10 mL 2   traMADol (ULTRAM) 50 MG tablet Take 50 mg by mouth every 6 (six) hours as needed for pain.     vitamin C (ASCORBIC ACID) 500 MG tablet Take 1,000 mg by mouth daily.     B-D 3CC LUER-LOK SYR 21GX1" 21G X 1" 3 ML MISC USE AS DIRECTED 20 each 0   cyclobenzaprine (FLEXERIL) 10 MG tablet Take 10 mg by mouth daily as needed for muscle spasms.     dextroamphetamine (DEXEDRINE SPANSULE) 15 MG 24 hr capsule Take 15 mg by mouth daily as needed (long/busy days).     Omega-3 Fatty Acids (FISH OIL) 1200 MG CAPS Take 1,200 mg by mouth daily. [name] made (Patient not taking: Reported on 03/09/2021)     ONE TOUCH ULTRA TEST test strip TEST BLOOD SUGAR 3 TIMES A DAY 100 each 3   ONETOUCH DELICA LANCETS 33A MISC USE TO CHECK SUGAR DAILY 100 each 0   Zinc 50 MG TABS Take 50 mg by mouth daily. (Patient not taking: Reported on 03/09/2021)      No results found for this or any previous visit (from the past 48 hour(s)). No results found.  Review of Systems  Constitutional:  Positive for activity change.  Musculoskeletal:  Positive for back pain and gait problem.  Neurological:  Positive for weakness and numbness.  All other systems reviewed and are negative.  Blood pressure (!) 170/97, pulse 88, temperature 98.2 F (36.8 C), temperature source Oral, resp. rate 18, height 5\' 10"  (1.778 m), weight 82.1 kg, SpO2 100 %. Physical Exam Constitutional:      Appearance: Normal appearance. He is normal weight.  HENT:     Head: Normocephalic and atraumatic.     Right Ear: Tympanic membrane normal.     Left Ear: Tympanic membrane normal.     Nose: Nose normal.     Mouth/Throat:     Mouth: Mucous membranes are moist.  Eyes:     Extraocular Movements: Extraocular movements intact.     Pupils: Pupils are equal, round, and reactive to light.   Cardiovascular:     Rate and Rhythm: Normal rate and regular rhythm.     Pulses: Normal pulses.     Heart sounds: Normal heart sounds.  Pulmonary:     Effort: Pulmonary effort is normal.     Breath sounds: Normal breath sounds.  Abdominal:     General: Abdomen is flat. Bowel sounds are normal.  Palpations: Abdomen is soft.  Musculoskeletal:     Cervical back: Normal range of motion.     Comments: Straight leg raising at 30 degrees in either lower extremity Patrick's maneuver is negative bilaterally.  Skin:    General: Skin is warm and dry.     Capillary Refill: Capillary refill takes less than 2 seconds.  Neurological:     General: No focal deficit present.     Mental Status: He is alert and oriented to person, place, and time.     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  Plan: Decompression fusion L2-3 and L4-5.  Earleen Newport, MD 03/22/2021, 7:43 AM

## 2021-03-23 LAB — BASIC METABOLIC PANEL
Anion gap: 9 (ref 5–15)
BUN: 11 mg/dL (ref 6–20)
CO2: 27 mmol/L (ref 22–32)
Calcium: 8.5 mg/dL — ABNORMAL LOW (ref 8.9–10.3)
Chloride: 97 mmol/L — ABNORMAL LOW (ref 98–111)
Creatinine, Ser: 1.06 mg/dL (ref 0.61–1.24)
GFR, Estimated: >60 mL/min (ref >60)
Glucose, Bld: 118 mg/dL — ABNORMAL HIGH (ref 70–99)
Potassium: 3.8 mmol/L (ref 3.5–5.1)
Sodium: 133 mmol/L — ABNORMAL LOW (ref 135–145)

## 2021-03-23 LAB — CBC
HCT: 42.2 % (ref 39.0–52.0)
Hemoglobin: 14.4 g/dL (ref 13.0–17.0)
MCH: 30.2 pg (ref 26.0–34.0)
MCHC: 34.1 g/dL (ref 30.0–36.0)
MCV: 88.5 fL (ref 80.0–100.0)
Platelets: 195 10*3/uL (ref 150–400)
RBC: 4.77 MIL/uL (ref 4.22–5.81)
RDW: 14.9 % (ref 11.5–15.5)
WBC: 15.9 10*3/uL — ABNORMAL HIGH (ref 4.0–10.5)
nRBC: 0 % (ref 0.0–0.2)

## 2021-03-23 LAB — GLUCOSE, CAPILLARY: Glucose-Capillary: 117 mg/dL — ABNORMAL HIGH (ref 70–99)

## 2021-03-23 MED ORDER — METHOCARBAMOL 500 MG PO TABS
500.0000 mg | ORAL_TABLET | Freq: Four times a day (QID) | ORAL | 3 refills | Status: DC | PRN
Start: 2021-03-23 — End: 2021-06-06

## 2021-03-23 MED ORDER — OXYCODONE-ACETAMINOPHEN 5-325 MG PO TABS: 1.0000 | ORAL_TABLET | ORAL | 0 refills | Status: DC | PRN

## 2021-03-23 MED FILL — Thrombin For Soln 5000 Unit: CUTANEOUS | Qty: 5000 | Status: AC

## 2021-03-23 NOTE — Evaluation (Signed)
Physical Therapy Evaluation and Discharge Patient Details Name: Paul Gates MRN: [id number] DOB: Nov 30, 1960 Today's Date: 03/23/2021  History of Present Illness  Pt is a 60 y.o. M s/p L2-3, L4-5 laminectomy with decompression 03/22/2021. PMH includes GERD, DM, depression, COPD, cervical, and lumbar fusion.  Clinical Impression  Patient evaluated by Physical Therapy with no further acute PT needs identified. Pt with fair pain control; denies radicular symptoms. Ambulating 400 feet with no assistive device and negotiated a half flight of stairs without physical assist. Education provided regarding brace use, spinal precautions, exercise recommendations and car transfer technique. All education has been completed and the patient has no further questions. No follow-up Physical Therapy or equipment needs. PT is signing off. Thank you for this referral.      Recommendations for follow up therapy are one component of a multi-disciplinary discharge planning process, led by the attending physician.  Recommendations may be updated based on patient status, additional functional criteria and insurance authorization.  Follow Up Recommendations No PT follow up    Assistance Recommended at Discharge Intermittent Supervision/Assistance  Functional Status Assessment Patient has had a recent decline in their functional status and demonstrates the ability to make significant improvements in function in a reasonable and predictable amount of time.  Equipment Recommendations  None recommended by PT    Recommendations for Other Services       Precautions / Restrictions Precautions Precautions: Back Precaution Booklet Issued: Yes (comment) Required Braces or Orthoses: Spinal Brace Spinal Brace: Lumbar corset;Applied in sitting position Restrictions Weight Bearing Restrictions: No      Mobility  Bed Mobility Overal bed mobility: Modified Independent             General bed mobility  comments: Sitting EOB upon arrival    Transfers Overall transfer level: Modified independent Equipment used: None               General transfer comment: increased time to rise    Ambulation/Gait Ambulation/Gait assistance: Modified independent (Device/Increase time) Gait Distance (Feet): 400 Feet Assistive device: None Gait Pattern/deviations: Step-through pattern Gait velocity: decreased for age     General Gait Details: Slower pace, steady gait overall, good posture  Stairs Stairs: Yes Stairs assistance: Modified independent (Device/Increase time) Stair Management: One rail Right;One rail Left Number of Stairs: 10 General stair comments: increased time, cues for step by step technique  Wheelchair Mobility    Modified Rankin (Stroke Patients Only)       Balance Overall balance assessment: No apparent balance deficits (not formally assessed)                                           Pertinent Vitals/Pain Pain Assessment: Faces Pain Score: 2  Faces Pain Scale: Hurts little more Pain Location: low back Pain Descriptors / Indicators: Constant;Discomfort Pain Intervention(s): Limited activity within patient's tolerance;Monitored during session    [place] expects to be discharged to:: Private residence Living Arrangements: Parent Available Help at Discharge: Family Type of Home: House Home Access: Stairs to enter   Technical brewer of Steps: 4-5   Home Layout: One level Home Equipment: Shower seat (shower seat is in storage)      Prior Function Prior Level of Function : Independent/Modified Independent             Mobility Comments: works for [employer]  Hand Dominance        Extremity/Trunk Assessment   Upper Extremity Assessment Upper Extremity Assessment: Overall WFL for tasks assessed    Lower Extremity Assessment Lower Extremity Assessment: RLE deficits/detail;LLE  deficits/detail RLE Deficits / Details: Strength 5/5 LLE Deficits / Details: Strength 5/5    Cervical / Trunk Assessment Cervical / Trunk Assessment: Back Surgery  Communication   Communication: No difficulties  Cognition Arousal/Alertness: Awake/alert Behavior During Therapy: WFL for tasks assessed/performed Overall Cognitive Status: Within Functional Limits for tasks assessed                                          General Comments      Exercises     Assessment/Plan    PT Assessment Patient does not need any further PT services  PT Problem List         PT Treatment Interventions      PT Goals (Current goals can be found in the Care Plan section)  Acute Rehab PT Goals Patient Stated Goal: return to work in 8 weeks, live "healthier," lifestyle PT Goal Formulation: All assessment and education complete, DC therapy    Frequency     Barriers to discharge        Co-evaluation               AM-PAC PT "6 Clicks" Mobility  Outcome Measure Help needed turning from your back to your side while in a flat bed without using bedrails?: None Help needed moving from lying on your back to sitting on the side of a flat bed without using bedrails?: None Help needed moving to and from a bed to a chair (including a wheelchair)?: None Help needed standing up from a chair using your arms (e.g., wheelchair or bedside chair)?: None Help needed to walk in hospital room?: None Help needed climbing 3-5 steps with a railing? : None 6 Click Score: 24    End of Session Equipment Utilized During Treatment: Back brace Activity Tolerance: Patient tolerated treatment well Patient left: in bed;with call bell/phone within reach Nurse Communication: Mobility status PT Visit Diagnosis: Pain Pain - part of body:  (back)    Time: 1443-1540 PT Time Calculation (min) (ACUTE ONLY): 28 min   Charges:   PT Evaluation $PT Eval Low Complexity: 1 Low PT  Treatments $Therapeutic Activity: 8-22 mins        Wyona Almas, PT, DPT Acute Rehabilitation Services Pager [phone] Office [phone]   Deno Etienne 03/23/2021, 10:39 AM

## 2021-03-23 NOTE — Evaluation (Signed)
Occupational Therapy Evaluation Patient Details Name: Paul Gates MRN: [id number] DOB: 10-30-1960 Today's Date: 03/23/2021   History of Present Illness Pt is a 60 y.o. M s/p L2-3, L4-5 laminectomy with decompression. PMH includes GERD, DM, depression, COPD, cervical, and lumbar fusion.   Clinical Impression   Pt independent with ADLs and functional mobility at baseline, lives with family who is able to assist 24/7 at d/c. Pt up in room with RN and NT upon arrival, requires supervision - min A for ADLs, Mod I for all transfers and bed mobility. Educated pt on compensatory dressing strategies, bed mobility, simulated tub transfer, and back precautions with handout. Pt verbalized and demonstrated understanding, adhered to precautions throughout session. Pt limited by decreased ROM and activity tolerance at this time, however has no acute OT needs. Will s/o. Recommend safe d/c home with assistance.     Recommendations for follow up therapy are one component of a multi-disciplinary discharge planning process, led by the attending physician.  Recommendations may be updated based on patient status, additional functional criteria and insurance authorization.   Follow Up Recommendations  No OT follow up    Assistance Recommended at Discharge PRN  Functional Status Assessment  Patient has had a recent decline in their functional status and demonstrates the ability to make significant improvements in function in a reasonable and predictable amount of time.  Equipment Recommendations  None recommended by OT;Other (comment) (pt has all DME)    Recommendations for Other Services PT consult     Precautions / Restrictions Precautions Precautions: Back Precaution Booklet Issued: Yes (comment) Restrictions Weight Bearing Restrictions: No      Mobility Bed Mobility Overal bed mobility: Modified Independent             General bed mobility comments: required min cues for log  rolling technique    Transfers Overall transfer level: Modified independent Equipment used: None               General transfer comment: pt up in room upon arrival      Balance Overall balance assessment: No apparent balance deficits (not formally assessed)                                         ADL either performed or assessed with clinical judgement   ADL Overall ADL's : Needs assistance/impaired Eating/Feeding: Set up;Sitting   Grooming: Oral care;Wash/dry hands;Standing   Upper Body Bathing: Minimal assistance;Sitting   Lower Body Bathing: Minimal assistance;Sitting/lateral leans   Upper Body Dressing : Minimal assistance Upper Body Dressing Details (indicate cue type and reason): min A for tightness of brace Lower Body Dressing: Sit to/from stand;Supervision/safety Lower Body Dressing Details (indicate cue type and reason): demo's ability to perform figure 4 technique for LB dressing Toilet Transfer: Supervision/safety;Ambulation;Comfort height toilet   Toileting- Clothing Manipulation and Hygiene: Supervision/safety;Sit to/from stand   Tub/ Shower Transfer: Minimal assistance;Ambulation;Tub transfer   Functional mobility during ADLs: Supervision/safety       Vision Baseline Vision/History: 1 Wears glasses Vision Assessment?: No apparent visual deficits     Perception     Praxis      Pertinent Vitals/Pain Pain Assessment: Faces Pain Score: 2  Faces Pain Scale: Hurts a little bit Pain Location: low back Pain Descriptors / Indicators: Constant;Discomfort Pain Intervention(s): Limited activity within patient's tolerance;Monitored during session;RN gave pain meds during session;Repositioned  Hand Dominance     Extremity/Trunk Assessment Upper Extremity Assessment Upper Extremity Assessment: Overall WFL for tasks assessed   Lower Extremity Assessment Lower Extremity Assessment: Defer to PT evaluation   Cervical / Trunk  Assessment Cervical / Trunk Assessment: Back Surgery   Communication Communication Communication: No difficulties   Cognition Arousal/Alertness: Awake/alert Behavior During Therapy: WFL for tasks assessed/performed Overall Cognitive Status: Within Functional Limits for tasks assessed                                       General Comments       Exercises     Shoulder Instructions      Home Living Family/patient expects to be discharged to:: Private residence Living Arrangements: Parent Available Help at Discharge: Family Type of Home: House Home Access: Stairs to enter Technical brewer of Steps: 4-5   Home Layout: One level     Bathroom Shower/Tub: Teacher, early years/pre: Standard Bathroom Accessibility: No   Home Equipment: Shower seat (shower seat is in storage)          Prior Functioning/Environment Prior Level of Function : Independent/Modified Independent                        OT Problem List: Decreased strength;Decreased range of motion;Decreased activity tolerance;Impaired balance (sitting and/or standing);Pain      OT Treatment/Interventions:      OT Goals(Current goals can be found in the care plan section) Acute Rehab OT Goals Patient Stated Goal: return PLOF OT Goal Formulation: With patient Time For Goal Achievement: 04/06/21 Potential to Achieve Goals: Good  OT Frequency:     Barriers to D/C:            Co-evaluation              AM-PAC OT "6 Clicks" Daily Activity     Outcome Measure Help from another person eating meals?: None Help from another person taking care of personal grooming?: None Help from another person toileting, which includes using toliet, bedpan, or urinal?: None Help from another person bathing (including washing, rinsing, drying)?: A Little Help from another person to put on and taking off regular upper body clothing?: A Little Help from another person to put on and  taking off regular lower body clothing?: None 6 Click Score: 22   End of Session Nurse Communication: Mobility status  Activity Tolerance: Patient tolerated treatment well Patient left: in bed;with call bell/phone within reach;Other (comment) (sitting EOB)  OT Visit Diagnosis: Unsteadiness on feet (R26.81);Muscle weakness (generalized) (M62.81);Pain                Time: 4315-4008 OT Time Calculation (min): 25 min Charges:  OT General Charges $OT Visit: 1 Visit OT Evaluation $OT Eval Low Complexity: 1 Low OT Treatments $Self Care/Home Management : 8-22 mins  Lynnda Child, OTD, OTR/L Acute Rehab [phone] [place] 03/23/2021, 8:09 AM

## 2021-03-23 NOTE — Plan of Care (Signed)
Pt doing well. Pt given D/C instructions with verbal understanding. Rx's were sent to the pharmacy by MD. Pt's incision is clean and dry with no sign of infection. Pt's IV was removed prior to D/C. Pt D/C'd home via wheelchair per MD order. Pt is stable @ D/C and has no other needs at this time. Donalyn Schneeberger, RN  

## 2021-03-23 NOTE — Anesthesia Postprocedure Evaluation (Signed)
Anesthesia Post Note  Patient: HANAN MOEN III  Procedure(s) Performed: Removal hardware previous fusion : Lumbar two-three, Lumbar four-five Posterior Lumbar Interbody Fusion with Pedicle screw fixation Lumbar two-five (Back)     Patient location during evaluation: PACU Anesthesia Type: General Level of consciousness: awake and alert Pain management: pain level controlled Vital Signs Assessment: post-procedure vital signs reviewed and stable Respiratory status: spontaneous breathing, nonlabored ventilation, respiratory function stable and patient connected to nasal cannula oxygen Cardiovascular status: blood pressure returned to baseline and stable Postop Assessment: no apparent nausea or vomiting Anesthetic complications: no   No notable events documented.  Last Vitals:  Vitals:   03/23/21 0328 03/23/21 0806  BP: 128/73 137/87  Pulse: 93 92  Resp: 20 18  Temp: 37.8 C 36.7 C  SpO2: 98% 98%    Last Pain:  Vitals:   03/23/21 0830  TempSrc:   PainSc: 4                  Mystery Schrupp P Harry Shuck

## 2021-03-23 NOTE — Discharge Summary (Signed)
Physician Discharge Summary  Patient ID: GOKU HARB MRN: [id number] DOB/AGE: 01/14/1961 60 y.o.  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  Neurogenic claudication, lumbar radiculopathy.  History of fusion L3-4.  Discharge Diagnoses: Lumbar spondylosis and stenosis L2-3, lumbar spondylosis and stenosis L4-5.  History of fusion L3-4.  Neurogenic claudication.  Lumbar radiculopathy. Principal Problem:   Lumbar stenosis with neurogenic claudication   Discharged Condition: good  Hospital Course: Patient was admitted to undergo surgical decompression at L2-3 and L4-5 above and below at L3-4 fusion that had been performed 21 years ago.  He tolerated surgery well.  Consults: None  Significant Diagnostic Studies: None  Treatments: surgery: See op note  Discharge Exam: Blood pressure 137/87, pulse 92, temperature 98.1 F (36.7 C), temperature source Oral, resp. rate 18, height 5\' 10"  (1.778 m), weight 82.1 kg, SpO2 98 %. Incision is clean and dry dressing is intact.  Patient's motor function is intact in the lower extremities.  No focal deficits are noted.  Station and gait are intact.  Disposition: Discharge disposition: 01-Home or Self Care       Discharge Instructions     Call MD for:  redness, tenderness, or signs of infection (pain, swelling, redness, odor or green/yellow discharge around incision site)   Complete by: As directed    Call MD for:  severe uncontrolled pain   Complete by: As directed    Call MD for:  temperature >100.4   Complete by: As directed    Diet - low sodium heart healthy   Complete by: As directed    Discharge wound care:   Complete by: As directed    Okay to shower. Do not apply salves or appointments to incision. No heavy lifting with the upper extremities greater than 10 pounds. May resume driving when not requiring pain medication  and patient feels comfortable with doing so.   Incentive spirometry RT   Complete by: As directed    Increase activity slowly   Complete by: As directed       Allergies as of 03/23/2021   No Known Allergies      Medication List     TAKE these medications    acetaminophen 500 MG tablet Commonly known as: TYLENOL Take 1,000 mg by mouth every 6 (six) hours as needed for moderate pain.   B-D 3CC LUER-LOK SYR 21GX1" 21G X 1" 3 ML Misc Generic drug: SYRINGE-NEEDLE (DISP) 3 ML USE AS DIRECTED   cetirizine 10 MG tablet Commonly known as: ZYRTEC Take 10 mg by mouth daily.   cyclobenzaprine 10 MG tablet Commonly known as: FLEXERIL Take 10 mg by mouth daily as needed for muscle spasms.   dextroamphetamine 15 MG 24 hr capsule Commonly known as: DEXEDRINE SPANSULE Take 15 mg by mouth daily as needed (long/busy days).   Fish Oil 1200 MG Caps Take 1,200 mg by mouth daily. Nature made   ibuprofen 200 MG tablet Commonly known as: ADVIL Take 600 mg by mouth every 6 (six) hours as needed for moderate pain.   levothyroxine 50 MCG tablet Commonly known as: SYNTHROID TAKE 1 TABLET BY MOUTH ONCE DAILY BEFORE BREAKFAST   losartan 25 MG tablet Commonly known as: COZAAR Take 1 tablet (25 mg total) by mouth daily.   metFORMIN 500 MG 24 hr tablet Commonly known as: GLUCOPHAGE-XR Take 1 tablet (500 mg total) by mouth 3 (three) times daily with meals.   methocarbamol  500 MG tablet Commonly known as: ROBAXIN Take 1 tablet (500 mg total) by mouth every 6 (six) hours as needed for muscle spasms.   omeprazole 20 MG tablet Commonly known as: PRILOSEC OTC Take 20 mg by mouth daily.   ONE TOUCH ULTRA TEST test strip Generic drug: glucose blood TEST BLOOD SUGAR 3 TIMES A DAY   OneTouch Delica Lancets 54G Misc USE TO CHECK SUGAR DAILY   oxyCODONE-acetaminophen 5-325 MG tablet Commonly known as: PERCOCET/ROXICET Take 1-2 tablets by mouth every 4 (four) hours as needed for moderate  pain or severe pain.   tamsulosin 0.4 MG Caps capsule Commonly known as: FLOMAX Take 0.4 mg by mouth daily.   testosterone cypionate 200 MG/ML injection Commonly known as: DEPOTESTOSTERONE CYPIONATE USE 2ML INTRAMUSCULARLY EVERY 2 WEEKS AS DIRECTED   traMADol 50 MG tablet Commonly known as: ULTRAM Take 50 mg by mouth every 6 (six) hours as needed for pain.   vitamin C 500 MG tablet Commonly known as: ASCORBIC ACID Take 1,000 mg by mouth daily.   Vitamin D-3 125 MCG (5000 UT) Tabs Take 5,000 Units by mouth daily.   Zinc 50 MG Tabs Take 50 mg by mouth daily.               Discharge Care Instructions  (From admission, onward)           Start     Ordered   03/23/21 0000  Discharge wound care:       Comments: Okay to shower. Do not apply salves or appointments to incision. No heavy lifting with the upper extremities greater than 10 pounds. May resume driving when not requiring pain medication and patient feels comfortable with doing so.   03/23/21 0900             Signed: Blanchie Dessert Ally Knodel 03/23/2021, 9:01 AM

## 2021-03-25 MED FILL — Sodium Chloride Irrigation Soln 0.9%: Qty: 3000 | Status: AC

## 2021-03-25 MED FILL — Sodium Chloride IV Soln 0.9%: INTRAVENOUS | Qty: 1000 | Status: AC

## 2021-03-25 MED FILL — Heparin Sodium (Porcine) Inj 1000 Unit/ML: INTRAMUSCULAR | Qty: 30 | Status: AC

## 2021-04-13 LAB — HM DIABETES EYE EXAM

## 2021-04-26 ENCOUNTER — Other Ambulatory Visit: Payer: Self-pay

## 2021-04-26 ENCOUNTER — Ambulatory Visit (HOSPITAL_COMMUNITY): Payer: 59 | Attending: Internal Medicine

## 2021-04-26 DIAGNOSIS — I483 Typical atrial flutter: Secondary | ICD-10-CM | POA: Insufficient documentation

## 2021-04-26 LAB — ECHOCARDIOGRAM COMPLETE
Area-P 1/2: 4.66 cm2
S' Lateral: 3.4 cm

## 2021-05-16 ENCOUNTER — Encounter: Payer: Self-pay | Admitting: Internal Medicine

## 2021-06-02 ENCOUNTER — Ambulatory Visit: Payer: 59 | Admitting: Physician Assistant

## 2021-06-02 NOTE — Progress Notes (Deleted)
Cardiology Office Note Date:  06/02/2021  Patient ID:  Paul Gates, MODE June 24, 1960, MRN [id number] PCP:  [name], MD  Cardiologist:  *** Electrophysiologist: Dr. Rayann Heman  ***refresh   Chief Complaint: *** 3 mo  History of Present Illness: Paul Gates is a 61 y.o. male with history of COPD, HTN, HLD, DM, AFlutter, ASD repair (1971), NICM  He comes in today to be seen for Dr. Rayann Heman, last seen by him Nov 2022.  AT that time he was months past his AFlutter ablation and [place] stopped. BB was giving him HAs and Toprol also stopped.  Planned to f/u om 17mo or an echo and APP visit.  If EF remained depressed recommended gen cards f/u and EP as needed.  Jan 2023: LVEF 55-60%, no WMA, miod conc LVH, RV OK, no significant VHD  *** symptoms *** labs,lipids *** EP prn *** BP etc w/PMD....   AFlutter hx Diagnosed Aug 2022, incidentally pre-op CTI ablation 02/01/21 No AAD hx  Past Medical History:  Diagnosis Date   COPD (chronic obstructive pulmonary disease) (HCC)    DDD (degenerative disc disease)    Depression 10/09/2013   Diabetes mellitus without complication (HCC)    GERD (gastroesophageal reflux disease)    Heart murmur    1971- per pt no issues reported by cardiology about murmur- Echo done   History of kidney stones    Hyperlipidemia    Hypertension    Hypogonadism male    Hypothyroidism    Typical atrial flutter ([place]) 2022   Vitamin D deficiency     Past Surgical History:  Procedure Laterality Date   A-FLUTTER ABLATION N/A 02/01/2021   Procedure: A-FLUTTER ABLATION;  Surgeon: Thompson Grayer, MD;  Location: [facility];  Service: Cardiovascular;  Laterality: N/A;   ASD REPAIR  1971   open heart   CERVICAL FUSION  1991   ESOPHAGOGASTRODUODENOSCOPY  1987,1992,1993,1995   duodenal ulcer   LUMBAR DISC SURGERY     LUMBAR FUSION  2001   LUMBAR LAMINECTOMY  7/97    Current Outpatient Medications  Medication Sig Dispense Refill    acetaminophen (TYLENOL) 500 MG tablet Take 1,000 mg by mouth every 6 (six) hours as needed for moderate pain.     B-D 3CC LUER-LOK SYR 21GX1" 21G X 1" 3 ML MISC USE AS DIRECTED 20 each 0   cetirizine (ZYRTEC) 10 MG tablet Take 10 mg by mouth daily.     Cholecalciferol (VITAMIN D-3) 5000 UNITS TABS Take 5,000 Units by mouth daily.     cyclobenzaprine (FLEXERIL) 10 MG tablet Take 10 mg by mouth daily as needed for muscle spasms.     dextroamphetamine (DEXEDRINE SPANSULE) 15 MG 24 hr capsule Take 15 mg by mouth daily as needed (long/busy days).     ibuprofen (ADVIL) 200 MG tablet Take 600 mg by mouth every 6 (six) hours as needed for moderate pain.     levothyroxine (SYNTHROID, LEVOTHROID) 50 MCG tablet TAKE 1 TABLET BY MOUTH ONCE DAILY BEFORE BREAKFAST (Patient taking differently: Take 50 mcg by mouth daily before breakfast.) 90 tablet 2   losartan (COZAAR) 25 MG tablet Take 1 tablet (25 mg total) by mouth daily. 90 tablet 3   metFORMIN (GLUCOPHAGE-XR) 500 MG 24 hr tablet Take 1 tablet (500 mg total) by mouth 3 (three) times daily with meals. 270 tablet 1   methocarbamol (ROBAXIN) 500 MG tablet Take 1 tablet (500 mg total) by mouth every 6 (six) hours as needed  for muscle spasms. 30 tablet 3   Omega-3 Fatty Acids (FISH OIL) 1200 MG CAPS Take 1,200 mg by mouth daily. Paul Gates made (Patient not taking: Reported on 03/09/2021)     omeprazole (PRILOSEC OTC) 20 MG tablet Take 20 mg by mouth daily.     ONE TOUCH ULTRA TEST test strip TEST BLOOD SUGAR 3 TIMES A DAY 100 each 3   ONETOUCH DELICA LANCETS 12I MISC USE TO CHECK SUGAR DAILY 100 each 0   oxyCODONE-acetaminophen (PERCOCET/ROXICET) 5-325 MG tablet Take 1-2 tablets by mouth every 4 (four) hours as needed for moderate pain or severe pain. 60 tablet 0   tamsulosin (FLOMAX) 0.4 MG CAPS capsule Take 0.4 mg by mouth daily.     testosterone cypionate (DEPOTESTOSTERONE CYPIONATE) 200 MG/ML injection USE 2ML INTRAMUSCULARLY EVERY 2 WEEKS AS DIRECTED 10 mL 2    traMADol (ULTRAM) 50 MG tablet Take 50 mg by mouth every 6 (six) hours as needed for pain.     vitamin C (ASCORBIC ACID) 500 MG tablet Take 1,000 mg by mouth daily.     Zinc 50 MG TABS Take 50 mg by mouth daily. (Patient not taking: Reported on 03/09/2021)     No current facility-administered medications for this visit.    Allergies:   Patient has no known allergies.   Social History:  The patient  reports that he has been smoking cigarettes. He started smoking about 43 years ago. He has been smoking an average of .25 packs per day. He has never used smokeless tobacco. He reports current alcohol use of about 4.0 standard drinks per week. He reports that he does not use drugs.   Family History:  The patient's family history includes Alcohol abuse in his father; Cancer in his maternal grandmother, mother, and paternal grandfather; Clotting disorder in his father; Diabetes in his maternal uncle; Hypertension in his father and mother; Stroke in his mother.  ROS:  Please see the history of present illness.    All other systems are reviewed and otherwise negative.   PHYSICAL EXAM:  VS:  There were no vitals taken for this visit. BMI: There is no height or weight on file to calculate BMI. Well nourished, well developed, in no acute distress HEENT: normocephalic, atraumatic Neck: no JVD, carotid bruits or masses Cardiac:  *** RRR; no significant murmurs, no rubs, or gallops Lungs:  *** CTA b/l, no wheezing, rhonchi or rales Abd: soft, nontender MS: no deformity or *** atrophy Ext: *** no edema Skin: warm and dry, no rash Neuro:  No gross deficits appreciated Psych: euthymic mood, full affect    EKG:  Done today and reviewed by myself shows  ***   04/26/2021: TTE IMPRESSIONS   1. Left ventricular ejection fraction, by estimation, is 55 to 60%. The  left ventricle has normal function. The left ventricle has no regional  wall motion abnormalities. There is mild concentric left ventricular   hypertrophy. Left ventricular diastolic  parameters were normal.   2. Right ventricular systolic function is normal. The right ventricular  size is normal. There is normal pulmonary artery systolic pressure.   3. The mitral valve is normal in structure. Trivial mitral valve  regurgitation. No evidence of mitral stenosis.   4. The aortic valve is tricuspid. Aortic valve regurgitation is not  visualized. No aortic stenosis is present.   5. The inferior vena cava is normal in size with greater than 50%  respiratory variability, suggesting right atrial pressure of 3 mmHg.   02/01/21:  EPS/ablation  CONCLUSIONS:   1. Isthmus-dependent counter clockwise right atrial flutter upon presentation.   2. Successful radiofrequency ablation of atrial flutter along the cavotricuspid isthmus with complete bidirectional isthmus block achieved.   3. No inducible arrhythmias following ablation.   4. No early apparent complications.   01/10/21: TTE IMPRESSIONS   1. Left ventricular ejection fraction, by estimation, is 45 to 50%. The  left ventricle has mildly decreased function. The left ventricle  demonstrates global hypokinesis. Left ventricular diastolic parameters are  indeterminate.   2. Right ventricular systolic function is low normal. The right  ventricular size is normal. Tricuspid regurgitation signal is inadequate  for assessing PA pressure.   3. The mitral valve is normal in structure. Trivial mitral valve  regurgitation. No evidence of mitral stenosis.   4. The aortic valve is tricuspid. Aortic valve regurgitation is not  visualized. No aortic stenosis is present.   5. The inferior vena cava is normal in size with <50% respiratory  variability, suggesting right atrial pressure of 8 mmHg.   6. The patient was in atrial flutter.   Recent Labs: 12/01/2020: ALT 28; Magnesium 2.1; TSH 2.96 03/23/2021: BUN 11; Creatinine, Ser 1.06; Hemoglobin 14.4; Platelets 195; Potassium 3.8; Sodium 133   12/01/2020: Cholesterol 119; HDL 29; LDL Cholesterol (Calc) 51; Total CHOL/HDL Ratio 4.1; Triglycerides 367   CrCl cannot be calculated (Patient's most recent lab result is older than the maximum 21 days allowed.).   Wt Readings from Last 3 Encounters:  03/22/21 181 lb (82.1 kg)  03/14/21 189 lb 12.8 oz (86.1 kg)  03/04/21 184 lb 12.8 oz (83.8 kg)     Other studies reviewed: Additional studies/records reviewed today include: summarized above  ASSESSMENT AND PLAN:  AFlutter (typical) S/p ablation Off a/c ***  NICM Recovered LVEF ***  HTN ***  Disposition: F/u with ***  Current medicines are reviewed at length with the patient today.  The patient did not have any concerns regarding medicines.  Venetia Night, PA-C 06/02/2021 7:06 AM     [facility] [address] [place] [address] [phone] (office)  [phone] (fax)

## 2021-06-03 ENCOUNTER — Ambulatory Visit: Payer: 59 | Admitting: Physician Assistant

## 2021-06-06 ENCOUNTER — Ambulatory Visit (INDEPENDENT_AMBULATORY_CARE_PROVIDER_SITE_OTHER): Payer: 59 | Admitting: Internal Medicine

## 2021-06-06 ENCOUNTER — Other Ambulatory Visit: Payer: Self-pay

## 2021-06-06 ENCOUNTER — Encounter: Payer: Self-pay | Admitting: Internal Medicine

## 2021-06-06 VITALS — BP 128/78 | HR 100 | Temp 97.9°F | Resp 16 | Ht 70.0 in | Wt 188.6 lb

## 2021-06-06 DIAGNOSIS — Z79899 Other long term (current) drug therapy: Secondary | ICD-10-CM

## 2021-06-06 DIAGNOSIS — Z136 Encounter for screening for cardiovascular disorders: Secondary | ICD-10-CM

## 2021-06-06 DIAGNOSIS — E039 Hypothyroidism, unspecified: Secondary | ICD-10-CM

## 2021-06-06 DIAGNOSIS — M5136 Other intervertebral disc degeneration, lumbar region: Secondary | ICD-10-CM

## 2021-06-06 DIAGNOSIS — E349 Endocrine disorder, unspecified: Secondary | ICD-10-CM

## 2021-06-06 DIAGNOSIS — Z0001 Encounter for general adult medical examination with abnormal findings: Secondary | ICD-10-CM

## 2021-06-06 DIAGNOSIS — N182 Chronic kidney disease, stage 2 (mild): Secondary | ICD-10-CM

## 2021-06-06 DIAGNOSIS — E1169 Type 2 diabetes mellitus with other specified complication: Secondary | ICD-10-CM

## 2021-06-06 DIAGNOSIS — Z0000 Encounter for general adult medical examination without abnormal findings: Secondary | ICD-10-CM | POA: Diagnosis not present

## 2021-06-06 DIAGNOSIS — Z111 Encounter for screening for respiratory tuberculosis: Secondary | ICD-10-CM | POA: Diagnosis not present

## 2021-06-06 DIAGNOSIS — F988 Other specified behavioral and emotional disorders with onset usually occurring in childhood and adolescence: Secondary | ICD-10-CM

## 2021-06-06 DIAGNOSIS — I10 Essential (primary) hypertension: Secondary | ICD-10-CM

## 2021-06-06 DIAGNOSIS — F17200 Nicotine dependence, unspecified, uncomplicated: Secondary | ICD-10-CM

## 2021-06-06 DIAGNOSIS — Z8249 Family history of ischemic heart disease and other diseases of the circulatory system: Secondary | ICD-10-CM | POA: Diagnosis not present

## 2021-06-06 DIAGNOSIS — Z1211 Encounter for screening for malignant neoplasm of colon: Secondary | ICD-10-CM

## 2021-06-06 DIAGNOSIS — Z125 Encounter for screening for malignant neoplasm of prostate: Secondary | ICD-10-CM

## 2021-06-06 DIAGNOSIS — R5383 Other fatigue: Secondary | ICD-10-CM

## 2021-06-06 DIAGNOSIS — E1122 Type 2 diabetes mellitus with diabetic chronic kidney disease: Secondary | ICD-10-CM

## 2021-06-06 DIAGNOSIS — E559 Vitamin D deficiency, unspecified: Secondary | ICD-10-CM

## 2021-06-06 NOTE — Patient Instructions (Signed)

## 2021-06-06 NOTE — Progress Notes (Signed)
Annual  Screening/Preventative Visit  & Comprehensive Evaluation & Examination  Future Appointments  Date Time Provider Department  06/06/2021  3:00 PM [name], MD [facility]  07/01/2021  8:50 AM Baldwin [place], PA-C [facility]  06/12/2022  3:00 PM [name], MD [facility]            This very nice 61 y.o. single WM presents for a Screening /Preventative Visit & comprehensive evaluation and management of multiple medical co-morbidities.  Patient has been followed for HTN, HLD, T2_NIDDM, Hypothyroidism, Testosterone  and Vitamin D Deficiency.  Patient also is on Dexadrine for Adult ADD  & treated reports with improved focus & concentration.       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        HTN predates since  2014. Patient's BP has been controlled at home.  Today's BP was sl elevated  and rechecked at goal - 128/78 .                                                    Last Aug , patient was discovered in Atrial Flutter and underwent ablation in October by Dr Rayann Heman.  Patient denies any cardiac symptoms as chest pain, palpitations, shortness of breath, dizziness or ankle swelling.       Patient's hyperlipidemia is controlled with diet . Last lipids were at goal except elevated Trig's :  Lab Results  Component Value Date   CHOL 119 12/01/2020   HDL 29 (L) 12/01/2020   LDLCALC 51 12/01/2020   TRIG 367 (H) 12/01/2020   CHOLHDL 4.1 12/01/2020         Patient has hx/o T2_NIDDM since  2013  and patient denies reactive hypoglycemic symptoms, visual blurring, diabetic polys or paresthesias. Last A1c was not at goal :   Lab Results  Component Value Date   HGBA1C 6.4 (H) 03/14/2021          Patient  was dx'd Hypothyroid in 2015 has been on Thyroid Replacement since. Patient also is on Depo-Testosterone for  Testosterone Deficiency &  reports  improved stamina & sense of well being       Finally, patient has history of Vitamin D Deficiency ("33" /2013) and last vitamin D was at goal :   Lab Results  Component Value Date   VD25OH 77 05/26/2020     Current Outpatient Medications on File Prior to Visit  Medication Sig   acetaminophen (TYLENOL) 500 MG tablet Take 1,000 mg  every 6  hours as needed    cetirizine 10 MG tablet Take 1 daily.   VITAMIN D 5000  u Take daily.   cyclobenzaprine 10 MG tablet Take 1 daily as needed for muscle spasms.   DEXEDRINE 15 MG 24 hr capsule Take  daily as needed (long/busy days).   ibuprofen (ADVIL) 200 MG tablet Take 600 mg  every 6 hours as needed    levothyroxine (50 MCG tablet TAKE 1 TABLET DAILY    losartan 25 MG tablet Take 1 tablet daily.   metFORMIN-XR 500 MG  Take 1 tablet 3 times daily with meals.   methocarbamol  500 MG tablet Take 1 tablet every 6  hours as needed    omeprazole 20 MG tablet Take daily.   tamsulosin (FLOMAX) 0.4 MG CAPS capsule Take daily.   testosterone cypio  200 MG/ML injection USE 2ML IM EVERY 2 WEEKS    traMADol (ULTRAM) 50 MG tablet Take 5 every 6  hours as needed for pain.   vitamin C  500 MG tablet Take 1,000 mg daily.    No Known Allergies   Past Medical History:  Diagnosis Date   COPD (chronic obstructive pulmonary disease) ([place])    DDD (degenerative disc disease)    Depression 10/09/2013   Diabetes mellitus without complication (HCC)    GERD (gastroesophageal reflux disease)    Heart murmur    1971- per pt no issues reported by cardiology about murmur- Echo done   History of kidney stones    Hyperlipidemia    Hypertension    Hypogonadism male    Hypothyroidism    Typical atrial flutter ([place]) 2022   Vitamin D deficiency      Health Maintenance  Topic Date Due   Zoster Vaccines- Shingrix (1 of 2) Never done   FOOT EXAM  05/24/2021   HEMOGLOBIN A1C  09/11/2021   OPHTHALMOLOGY EXAM  04/13/2022    TETANUS/TDAP  07/18/2023   COLONOSCOPY  08/28/2023   INFLUENZA VACCINE  Completed   COVID-19 Vaccine  Completed   Hepatitis C Screening  Completed   HIV Screening  Completed   HPV VACCINES  Aged Out     Immunization History  Administered Date(s) Administered   DTaP 04/04/2001, 04/24/2008   Hepatitis A, Adult 02/04/2008   Hepatitis B, adult 03/06/2008, 08/04/2008   Influenza Inj Mdck Quad  02/21/2017   Influenza Split 02/16/2016, 03/02/2016, 02/22/2017   Influenza Whole 01/09/2013   Influenza,inj,Quad  04/07/2019, 04/06/2020   Influenza,inj,quad,  02/17/2016   Influenza 05/21/2007, 01/20/2008, 01/23/2012, 02/16/2015   PFIZER  SARS-COV-2 Vacc 07/04/2019, 07/25/2019, 03/11/2020   PPD Test 02/21/2017, 04/02/2018, 05/07/2019, 05/26/2020   Pneumococcal -23 04/27/2016, 02/21/2017   Td 10/30/2002   Tdap 11/18/2007, 07/17/2013    Last Colon - 08/27/2013 - Dr Delfin Edis - recc 10 yr  F/u due Oct 2025   Past Surgical History:  Procedure Laterality Date   A-FLUTTER ABLATION N/A 02/01/2021   Procedure: A-FLUTTER Johnette Abraham, MD   ASD REPAIR  1971   open heart   CERVICAL FUSION  1991   ESOPHAGOGASTRODUODENOSCOPY  [phone]   duodenal ulcer   LUMBAR DISC SURGERY     LUMBAR FUSION  2001   LUMBAR LAMINECTOMY  7/97     Family History  Problem Relation Age of Onset   Hypertension Mother    Stroke Mother    Cancer Mother        ovarian   Alcohol abuse Father    Hypertension Father    Clotting disorder Father        antiphospholipid syndrome    Diabetes Maternal Uncle    Cancer Maternal Grandmother        lung   Cancer Paternal Grandfather        lung   Colon cancer Neg Hx    Stomach cancer Neg Hx      Social History   Tobacco Use   Smoking status: Every Day    Packs/day: 0.25    Types: Cigarettes    Start date: 1980   Smokeless tobacco: Never   Tobacco comments:    Currently down to smoking 0.5 pack   Vaping Use  Vaping Use: Never used   Substance Use Topics   Alcohol use: Yes    Alcohol/week: 4.0 standard drinks    Types: 4 Standard drinks or equivalent per week    Comment: socially- 2-3 drinks weekly   Drug use: No      ROS Constitutional: Denies fever, chills, weight loss/gain, headaches, insomnia,  night sweats or change in appetite. Does c/o fatigue. Eyes: Denies redness, blurred vision, diplopia, discharge, itchy or watery eyes.  ENT: Denies discharge, congestion, post nasal drip, epistaxis, sore throat, earache, hearing loss, dental pain, Tinnitus, Vertigo, Sinus pain or snoring.  Cardio: Denies chest pain, palpitations, irregular heartbeat, syncope, dyspnea, diaphoresis, orthopnea, PND, claudication or edema Respiratory: denies cough, dyspnea, DOE, pleurisy, hoarseness, laryngitis or wheezing.  Gastrointestinal: Denies dysphagia, heartburn, reflux, water brash, pain, cramps, nausea, vomiting, bloating, diarrhea, constipation, hematemesis, melena, hematochezia, jaundice or hemorrhoids Genitourinary: Denies dysuria, frequency, urgency, nocturia, hesitancy, discharge, hematuria or flank pain Musculoskeletal: Denies arthralgia, myalgia, stiffness, Jt. Swelling, pain, limp or strain/sprain. Denies Falls. Skin: Denies puritis, rash, hives, warts, acne, eczema or change in skin lesion Neuro: No weakness, tremor, incoordination, spasms, paresthesia or pain Psychiatric: Denies confusion, memory loss or sensory loss. Denies Depression. Endocrine: Denies change in weight, skin, hair change, nocturia, and paresthesia, diabetic polys, visual blurring or hyper / hypo glycemic episodes.  Heme/Lymph: No excessive bleeding, bruising or enlarged lymph nodes.   Physical Exam  BP 128/78    Pulse 100    Temp 97.9 F (36.6 C)    Resp 16    Ht 5\' 10"  (1.778 m)    Wt 188 lb 9.6 oz (85.5 kg)    SpO2 96%    BMI 27.06 kg/m   General Appearance: Well nourished and well groomed and in no apparent distress.  Eyes: PERRLA, EOMs,  conjunctiva no swelling or erythema, normal fundi and vessels. Sinuses: No frontal/maxillary tenderness ENT/Mouth: EACs patent / TMs  nl. Nares clear without erythema, swelling, mucoid exudates. Oral hygiene is good. No erythema, swelling, or exudate. Tongue normal, non-obstructing. Tonsils not swollen or erythematous. Hearing normal.  Neck: Supple, thyroid not palpable. No bruits, nodes or JVD. Respiratory: Respiratory effort normal.  BS equal and clear bilateral without rales, rhonci, wheezing or stridor. Cardio: Heart sounds are normal with regular rate and rhythm and no murmurs, rubs or gallops. Peripheral pulses are normal and equal bilaterally without edema. No aortic or femoral bruits. Chest: symmetric with normal excursions and percussion.  Abdomen: Soft, with Nl bowel sounds. Nontender, no guarding, rebound, hernias, masses, or organomegaly.  Lymphatics: Non tender without lymphadenopathy.  Musculoskeletal: Full ROM all peripheral extremities, joint stability, 5/5 strength, and normal gait. Skin: Warm and dry without rashes, lesions, cyanosis, clubbing or  ecchymosis.  Neuro: Cranial nerves intact, reflexes equal bilaterally. Normal muscle tone, no cerebellar symptoms. Sensation intact.  Pysch: Alert and oriented X 3 with normal affect, insight and judgment appropriate.   Assessment and Plan  1. Annual Preventative/Screening Exam   1. Encounter for general adult medical examination with abnormal findings   2. Essential hypertension  - EKG 12-Lead - [place], RETROPERITNL ABD,  LTD - Urinalysis, Routine w reflex microscopic - Microalbumin / creatinine urine ratio - CBC with Differential/Platelet - COMPLETE METABOLIC PANEL WITH GFR - Magnesium - TSH  3. Hyperlipidemia associated with type 2 diabetes mellitus ([place])  - EKG 12-Lead - [place], RETROPERITNL ABD,  LTD - Lipid panel - Hemoglobin A1c - Insulin, random  4. Type 2 diabetes mellitus with stage 2 chronic kidney  disease,  without long-term current use of insulin (HCC)  - EKG 12-Lead - [place], RETROPERITNL ABD,  LTD - Urinalysis, Routine w reflex microscopic - Microalbumin / creatinine urine ratio - HM DIABETES FOOT EXAM - PR LOW EXTEMITY NEUR EXAM DOCUM - Hemoglobin A1c - Insulin, random  5. Vitamin D deficiency  - VITAMIN D 25 Hydroxy   6. Hypothyroidism  - TSH  7. DDD (degenerative disc disease), lumbar   8. Testosterone deficiency  - Testosterone  9. Attention deficit disorder   10. Prostate cancer screening  - PSA  11. Screening for colorectal cancer  - POC Hemoccult Bld/Stl (3-Cd Home Screen); Future  12. Screening examination for pulmonary tuberculosis  - TB Skin Test  13. Screening for ischemic heart disease  - EKG 12-Lead  14. FHx: heart disease  - EKG 12-Lead - [place], RETROPERITNL ABD,  LTD  15. Smoker  - EKG 12-Lead - [place], RETROPERITNL ABD,  LTD  16. Screening for AAA (aortic abdominal aneurysm)  - [place], RETROPERITNL ABD,  LTD  17. Fatigue, unspecified type  - Iron, Total/Total Iron Binding Cap - Vitamin B12 - Testosterone - CBC with Differential/Platelet - TSH  18. Medication management  - Urinalysis, Routine w reflex microscopic - Microalbumin / creatinine urine ratio - CBC with Differential/Platelet - COMPLETE METABOLIC PANEL WITH GFR - Magnesium - Lipid panel - TSH - Hemoglobin A1c - Insulin, random - VITAMIN D 25 Hydroxy           Patient was counseled in prudent diet, weight control to achieve/maintain BMI less than 25, BP monitoring, regular exercise and medications as discussed.  Discussed med effects and SE's. Routine screening labs and tests as requested with regular follow-up as recommended. Over 40 minutes of exam, counseling, chart review and high complex critical decision making was performed   Kirtland Bouchard, MD

## 2021-06-07 LAB — HEMOGLOBIN A1C
Hgb A1c MFr Bld: 6 % of total Hgb — ABNORMAL HIGH (ref <5.7)
Mean Plasma Glucose: 126 mg/dL
eAG (mmol/L): 7 mmol/L

## 2021-06-07 LAB — LIPID PANEL
Cholesterol: 117 mg/dL (ref <200)
HDL: 29 mg/dL — ABNORMAL LOW (ref >=40)
LDL Cholesterol (Calc): 52 mg/dL (calc)
Non-HDL Cholesterol (Calc): 88 mg/dL (calc) (ref <130)
Total CHOL/HDL Ratio: 4 (calc) (ref <5.0)
Triglycerides: 342 mg/dL — ABNORMAL HIGH (ref <150)

## 2021-06-07 LAB — URINALYSIS, ROUTINE W REFLEX MICROSCOPIC
Bacteria, UA: NONE SEEN /HPF
Bilirubin Urine: NEGATIVE
Glucose, UA: NEGATIVE
Hgb urine dipstick: NEGATIVE
Hyaline Cast: NONE SEEN /LPF
Ketones, ur: NEGATIVE
Leukocytes,Ua: NEGATIVE
Nitrite: NEGATIVE
Specific Gravity, Urine: 1.031 (ref 1.001–1.035)
Squamous Epithelial / HPF: NONE SEEN /HPF (ref <=5)
WBC, UA: NONE SEEN /HPF (ref 0–5)
pH: 5.5 (ref 5.0–8.0)

## 2021-06-07 LAB — CBC WITH DIFFERENTIAL/PLATELET
Absolute Monocytes: 988 cells/uL — ABNORMAL HIGH (ref 200–950)
Basophils Absolute: 50 cells/uL (ref 0–200)
Basophils Relative: 0.4 %
Eosinophils Absolute: 75 cells/uL (ref 15–500)
Eosinophils Relative: 0.6 %
HCT: 48.1 % (ref 38.5–50.0)
Hemoglobin: 16.1 g/dL (ref 13.2–17.1)
Lymphs Abs: 2513 cells/uL (ref 850–3900)
MCH: 30.1 pg (ref 27.0–33.0)
MCHC: 33.5 g/dL (ref 32.0–36.0)
MCV: 89.9 fL (ref 80.0–100.0)
MPV: 11.3 fL (ref 7.5–12.5)
Monocytes Relative: 7.9 %
Neutro Abs: 8875 cells/uL — ABNORMAL HIGH (ref 1500–7800)
Neutrophils Relative %: 71 %
Platelets: 272 10*3/uL (ref 140–400)
RBC: 5.35 10*6/uL (ref 4.20–5.80)
RDW: 15.1 % — ABNORMAL HIGH (ref 11.0–15.0)
Total Lymphocyte: 20.1 %
WBC: 12.5 10*3/uL — ABNORMAL HIGH (ref 3.8–10.8)

## 2021-06-07 LAB — COMPLETE METABOLIC PANEL WITH GFR
AG Ratio: 2 (calc) (ref 1.0–2.5)
ALT: 30 U/L (ref 9–46)
AST: 17 U/L (ref 10–35)
Albumin: 4.9 g/dL (ref 3.6–5.1)
Alkaline phosphatase (APISO): 107 U/L (ref 35–144)
BUN: 14 mg/dL (ref 7–25)
CO2: 27 mmol/L (ref 20–32)
Calcium: 10.2 mg/dL (ref 8.6–10.3)
Chloride: 101 mmol/L (ref 98–110)
Creat: 1 mg/dL (ref 0.70–1.30)
Globulin: 2.4 g/dL (calc) (ref 1.9–3.7)
Glucose, Bld: 158 mg/dL — ABNORMAL HIGH (ref 65–99)
Potassium: 4.2 mmol/L (ref 3.5–5.3)
Sodium: 141 mmol/L (ref 135–146)
Total Bilirubin: 0.4 mg/dL (ref 0.2–1.2)
Total Protein: 7.3 g/dL (ref 6.1–8.1)
eGFR: 87 mL/min/{1.73_m2} (ref >=60)

## 2021-06-07 LAB — MICROALBUMIN / CREATININE URINE RATIO
Creatinine, Urine: 208 mg/dL (ref 20–320)
Microalb Creat Ratio: 54 mcg/mg creat — ABNORMAL HIGH (ref <30)
Microalb, Ur: 11.3 mg/dL

## 2021-06-07 LAB — VITAMIN D 25 HYDROXY (VIT D DEFICIENCY, FRACTURES): Vit D, 25-Hydroxy: 73 ng/mL (ref 30–100)

## 2021-06-07 LAB — IRON, TOTAL/TOTAL IRON BINDING CAP
%SAT: 7 % (calc) — ABNORMAL LOW (ref 20–48)
Iron: 25 ug/dL — ABNORMAL LOW (ref 50–180)
TIBC: 356 mcg/dL (calc) (ref 250–425)

## 2021-06-07 LAB — TESTOSTERONE: Testosterone: 653 ng/dL (ref 250–827)

## 2021-06-07 LAB — MAGNESIUM: Magnesium: 2.1 mg/dL (ref 1.5–2.5)

## 2021-06-07 LAB — PSA: PSA: 0.77 ng/mL (ref <=4.00)

## 2021-06-07 LAB — TSH: TSH: 3.96 mIU/L (ref 0.40–4.50)

## 2021-06-07 LAB — VITAMIN B12: Vitamin B-12: 612 pg/mL (ref 200–1100)

## 2021-06-07 LAB — INSULIN, RANDOM: Insulin: 87.4 u[IU]/mL — ABNORMAL HIGH

## 2021-06-07 LAB — MICROSCOPIC MESSAGE

## 2021-06-20 ENCOUNTER — Other Ambulatory Visit: Payer: Self-pay

## 2021-06-20 ENCOUNTER — Other Ambulatory Visit: Payer: Self-pay | Admitting: Internal Medicine

## 2021-06-20 DIAGNOSIS — Z1211 Encounter for screening for malignant neoplasm of colon: Secondary | ICD-10-CM

## 2021-06-20 DIAGNOSIS — Z1212 Encounter for screening for malignant neoplasm of rectum: Secondary | ICD-10-CM

## 2021-06-20 DIAGNOSIS — R195 Other fecal abnormalities: Secondary | ICD-10-CM

## 2021-06-20 LAB — POC HEMOCCULT BLD/STL (HOME/3-CARD/SCREEN)
Card #2 Fecal Occult Blod, POC: POSITIVE
Card #3 Fecal Occult Blood, POC: POSITIVE
Fecal Occult Blood, POC: POSITIVE — AB

## 2021-06-21 DIAGNOSIS — Z1212 Encounter for screening for malignant neoplasm of rectum: Secondary | ICD-10-CM | POA: Diagnosis not present

## 2021-06-21 DIAGNOSIS — Z1211 Encounter for screening for malignant neoplasm of colon: Secondary | ICD-10-CM | POA: Diagnosis not present

## 2021-06-23 ENCOUNTER — Telehealth: Payer: Self-pay

## 2021-06-23 NOTE — Telephone Encounter (Signed)
Fecal Occult-Positive. Provider aware and referral placed, Patient aware.  ?

## 2021-06-30 NOTE — Progress Notes (Signed)
? ? ?Office Visit  ?  ?Patient Name: Paul Gates ?Date of Encounter: 06/30/2021 ? ?Primary Care Provider:  [name], MD ?Primary Cardiologist:  None ? ?Chief Complaint  ?  ?57-monthfollow-up post flutter ablation ?History of Present Illness  ?  ?Paul Gates is a 61y.o. male with PMH of HTN, atrial flutter s/p ablation 11/22 on Eliquis, COPD, HLD, hypothyroid, and GERD.  New onset flutter discovered in preop for back surgery on 8/22.  Procedure was canceled and patient presented to AF clinic on 12/14/2020.  He was asymptomatic and in typical flutter with a controlled rate.  He was referred to EP and underwent successful flutter ablation by Dr. ARayann Hemanon 02/01/21.  His [place] stopped as well as Toprol XL.  Echo completed 1/23 for nonischemic cardiomyopathy.  LVEF 45-50%, NWMA, mild LVH, and no valvular abnormalities.  Repeat echo completed 1/23 with recovered EF 55-60%. Patient underwent lumbar fusion with hardware removal and laminectomy. ? ?Since last being seen in our clinic the Mr. PMcclenathanreports doing well and has not experienced any palpitations or accelerated heartbeats.  He denies chest pain,  dyspnea, PND, orthopnea, nausea, vomiting, dizziness, syncope, edema, weight gain, or early satiety.  His back surgery was successful and is now recovering.  [facility] his blood pressures were elevated at home in the 17[facility].  His Cozaar was increased from 25 mg to 100 mg.  He is tolerating this well and pressure today in office was 122/74. ? ?Past Medical History  ?  ?Past Medical History:  ?Diagnosis Date  ? COPD ([place] obstructive pulmonary disease) [place]   ? DDD (degenerative disc disease)   ? Depression 10/09/2013  ? [place] complication [place]   ? GERD (gastroesophageal reflux disease)   ? Heart murmur   ? 1971- per pt no issues reported by cardiology about murmur- Echo done  ? History of kidney stones   ?  Hyperlipidemia   ? Hypertension   ? Hypogonadism male   ? Hypothyroidism   ? Typical atrial flutter ([place] 20[place]  ? Vitamin D deficiency   ? ?Past Surgical History:  ?Procedure Laterality Date  ? A-FLUTTER ABLATION N/A 02/01/2021  ? Procedure: A-FLUTTER ABLATION;  Surgeon: AThompson Grayer MD;  [facility];  Service: Cardiovascular;  Laterality: N/A;  ? ASD REPAIR  1971  ? open heart  ? CERVICAL FUSION  1991  ? ESOPHAGOGASTRODUODENOSCOPY  [phone] ? duodenal ulcer  ? LUMBAR DISC SURGERY    ? LUMBAR FUSION  2001  ? LUMBAR LAMINECTOMY  7/97  ? ? ?Allergies ? ?No Known Allergies ? ?Home Medications  ?  ?Current Outpatient Medications  ?Medication Sig Dispense Refill  ? acetaminophen (TYLENOL) 500 MG tablet Take 1,000 mg by mouth every 6 (six) hours as needed for moderate pain.    ? B-D 3CC LUER-LOK SYR 21GX1" 21G X 1" 3 ML MISC USE AS DIRECTED 20 each 0  ? cetirizine (ZYRTEC) 10 MG tablet Take 10 mg by mouth daily.    ? Cholecalciferol (VITAMIN D-3) 5000 UNITS TABS Take 5,000 Units by mouth daily.    ? cyclobenzaprine (FLEXERIL) 10 MG tablet Take 10 mg by mouth daily as needed for muscle spasms.    ? dextroamphetamine (DEXEDRINE SPANSULE) 15 MG 24 hr capsule Take 15 mg by mouth daily as needed (long/busy days).    ? ibuprofen (ADVIL) 200 MG tablet Take 600  mg by mouth every 6 (six) hours as needed for moderate pain.    ? levothyroxine (SYNTHROID, LEVOTHROID) 50 MCG tablet TAKE 1 TABLET BY MOUTH ONCE DAILY BEFORE BREAKFAST (Patient taking differently: Take 50 mcg by mouth daily before breakfast.) 90 tablet 2  ? losartan (COZAAR) 25 MG tablet Take 1 tablet (25 mg total) by mouth daily. 90 tablet 3  ? metFORMIN (GLUCOPHAGE-XR) 500 MG 24 hr tablet Take 1 tablet (500 mg total) by mouth 3 (three) times daily with meals. 270 tablet 1  ? omeprazole (PRILOSEC OTC) 20 MG tablet Take 20 mg by mouth daily.    ? ONE TOUCH ULTRA TEST test strip TEST BLOOD SUGAR 3 TIMES A DAY 100 each 3  ? ONETOUCH DELICA  LANCETS 67T MISC USE TO CHECK SUGAR DAILY 100 each 0  ? tamsulosin (FLOMAX) 0.4 MG CAPS capsule Take 0.4 mg by mouth daily.    ? testosterone cypionate (DEPOTESTOSTERONE CYPIONATE) 200 MG/ML injection USE 2ML INTRAMUSCULARLY EVERY 2 WEEKS AS DIRECTED 10 mL 2  ? traMADol (ULTRAM) 50 MG tablet Take 50 mg by mouth every 6 (six) hours as needed for pain.    ? vitamin C (ASCORBIC ACID) 500 MG tablet Take 1,000 mg by mouth daily.    ? ?No current facility-administered medications for this visit.  ?  ? ?Review of Systems  ? ?General:  No chills, fever, night sweats or weight changes.  ?Cardiovascular:  No chest pain, dyspnea on exertion, edema, orthopnea, palpitations, paroxysmal nocturnal dyspnea. ?Dermatological: No rash, lesions/masses ?Respiratory: No cough, dyspnea ?Urologic: No hematuria, dysuria ?Abdominal:   No nausea, vomiting, diarrhea, bright red blood per rectum, melena, or hematemesis ?Neurologic:  No visual changes, wkns, changes in mental status. ?All other systems reviewed and are otherwise negative except as noted above. ? ? ? ?Physical Exam  ?  ?Wt Readings from Last 3 Encounters:  ?06/06/21 188 lb 9.6 oz (85.5 kg)  ?03/22/21 181 lb (82.1 kg)  ?03/14/21 189 lb 12.8 oz (86.1 kg)  ? ? ?IW:PYKDX were no vitals filed for this visit.  ? ?GEN: Well nourished, well developed, in no acute distress. ?Neck: Supple, no JVD, carotid bruits, or masses. ?Cardiac: RRR, no murmurs, rubs, or gallops. No clubbing, cyanosis, edema.  Radials/PT 2+ and equal bilaterally.  ?Respiratory:  Respirations regular and unlabored, clear to auscultation bilaterally. ?MS: no deformity or atrophy. ?Skin: warm and dry, no rash. ?Neuro:  Strength and sensation are intact. ?Psych: Normal affect. ? ?Accessory Clinical Findings  ?  ?ECG personally reviewed by me today -none ordered for this visit ? ?Risk Assessment/Calculations:   ?  ? ?    ?Lab Results  ?Component Value Date  ? WBC 12.5 (H) 06/06/2021  ? HGB 16.1 06/06/2021  ? HCT 48.1  06/06/2021  ? MCV 89.9 06/06/2021  ? PLT 272 06/06/2021  ? ?Lab Results  ?Component Value Date  ? CREATININE 1.00 06/06/2021  ? BUN 14 06/06/2021  ? NA 141 06/06/2021  ? K 4.2 06/06/2021  ? CL 101 06/06/2021  ? CO2 27 06/06/2021  ? ?Lab Results  ?Component Value Date  ? ALT 30 06/06/2021  ? AST 17 06/06/2021  ? ALKPHOS 67 08/29/2016  ? BILITOT 0.4 06/06/2021  ? ?Lab Results  ?Component Value Date  ? CHOL 117 06/06/2021  ? HDL 29 (L) 06/06/2021  ? [place] 52 06/06/2021  ? TRIG 342 (H) 06/06/2021  ? CHOLHDL 4.0 06/06/2021  ?  ?Lab Results  ?Component Value Date  ? HGBA1C 6.0 (H)  06/06/2021  ? ? ?Assessment & Plan  ?  ?1.  Typical atrial flutter: ?-s/p flutter ablation by Dr. Rayann Heman on 02/01/21.  Patient reports that he is doing well and has not felt any palpitations or accelerated heart rate. ?-Doing well well since surgical procedure and is now having physical therapy. ?-NOA's were discontinued post ablation ? ?2.  Hypertension: ?-Blood pressure today is well controlled at 122/74 ?-General cardiology increased his Cozaar 25 mg to 100 mg ? ?3.  Nonischemic cardiomyopathy: ?-Echo completed 1/23 with LVEF: 55-60% ?-Patient has no complaints of shortness of breath with any exertional movement. ?-Euvolemic on examination today ? ?Disposition: Follow-up with Dr./APP in 1 year  ? ?   ? ?Medication Adjustments/Labs and Tests Ordered: ?Current medicines are reviewed at length with the patient today.  Concerns regarding medicines are outlined above.  ?Tests Ordered: ?No orders of the defined types were placed in this encounter. ? ?Medication Changes: ?No orders of the defined types were placed in this encounter. ? ? ? ?Mable Fill, Marissa Nestle, NP ?06/30/2021, 1:08 PM ?    ?

## 2021-07-01 ENCOUNTER — Encounter: Payer: Self-pay | Admitting: Nurse Practitioner

## 2021-07-01 ENCOUNTER — Other Ambulatory Visit: Payer: Self-pay

## 2021-07-01 ENCOUNTER — Ambulatory Visit: Payer: 59 | Admitting: Nurse Practitioner

## 2021-07-01 VITALS — BP 122/74 | HR 61 | Ht 69.0 in | Wt 193.0 lb

## 2021-07-01 DIAGNOSIS — I10 Essential (primary) hypertension: Secondary | ICD-10-CM

## 2021-07-01 DIAGNOSIS — I483 Typical atrial flutter: Secondary | ICD-10-CM

## 2021-07-01 NOTE — Patient Instructions (Addendum)
Medication Instructions:  ? ?Your physician recommends that you continue on your current medications as directed. Please refer to the Current Medication list given to you today. ? ?*If you need a refill on your cardiac medications before your next appointment, please call your pharmacy* ? ? ?Lab Work:  Paul Gates ? ? ?If you have labs (blood work) drawn today and your tests are completely normal, you will receive your results only by: ?MyChart Message (if you have MyChart) OR ?A paper copy in the mail ?If you have any lab test that is abnormal or we need to change your treatment, we will call you to review the results. ? ? ?Testing/Procedures:  NONE ORDERED  TODAY ? ? ?Follow-Up: ?At [facility], you and your health needs are our priority.  As part of our continuing mission to provide you with exceptional heart care, we have created designated Provider Care Teams.  These Care Teams include your primary Cardiologist (physician) and Advanced Practice Providers (APPs -  Physician Assistants and Nurse Practitioners) who all work together to provide you with the care you need, when you need it. ? ?We recommend signing up for the patient portal called "MyChart".  Sign up information is provided on this After Visit Summary.  MyChart is used to connect with patients for Virtual Visits (Telemedicine).  Patients are able to view lab/test results, encounter notes, upcoming appointments, etc.  Non-urgent messages can be sent to your provider as well.   ?To learn more about what you can do with MyChart, go to [email or web address].   ? ?Your next appointment:   ?1 year(s) CONTACT [facility] [phone] AS NEEDED FOR  ANY CARDIAC RELATED SYMPTOMS ? ? ?The format for your next appointment:   ?In Person ? ?Provider:   ?You may see Dr. Curt Bears   or one of the following Advanced Practice Providers on your designated Care Team:   ?Tommye Standard, PA-C ?Legrand Como "Jonni Sanger" Chalmers Cater, PA-C{ ? ? ? ?Other Instructions ? ?

## 2021-07-11 ENCOUNTER — Other Ambulatory Visit: Payer: Self-pay

## 2021-07-11 ENCOUNTER — Ambulatory Visit (INDEPENDENT_AMBULATORY_CARE_PROVIDER_SITE_OTHER): Payer: 59 | Admitting: Nurse Practitioner

## 2021-07-11 ENCOUNTER — Encounter: Payer: Self-pay | Admitting: Nurse Practitioner

## 2021-07-11 VITALS — BP 148/72 | HR 88 | Temp 97.7°F | Wt 194.2 lb

## 2021-07-11 DIAGNOSIS — E349 Endocrine disorder, unspecified: Secondary | ICD-10-CM

## 2021-07-11 DIAGNOSIS — I10 Essential (primary) hypertension: Secondary | ICD-10-CM

## 2021-07-11 NOTE — Patient Instructions (Signed)

## 2021-07-11 NOTE — Progress Notes (Signed)
Assessment and Plan: ? ?Paul Gates was seen today for a follow up on HTN. ? ?Diagnoses and all order for this visit: ? ?1. Essential hypertension ?Will continue to monitor.  Not at goal during today's OFV.  ?Continue current medications as directed. ?Automatic machine was used to compare manual reading in office.  A difference of 20 mm Hg both systolically and diastosically was noted.   ?Suggest faulty automatic BP machine.  ?Requested to purchase/have sent new automatic BP machine for most accurate reading of BP.   ? ?2. Testosterone deficiency ?Continue bi-weekly injections as scheduled. ? ?Further disposition pending results of labs. Discussed med's effects and SE's.   ?Over 30 minutes of exam, counseling, chart review, and critical decision making was performed.  ? ?Future Appointments  ?Date Time Provider [place]  ?07/14/2021  2:30 PM Vladimir Crofts, PA-C [facility]  ?09/08/2021  4:00 PM Mull, Townsend Roger, NP [facility] None  ?12/12/2021  4:00 PM [name], MD [facility] None  ?06/12/2022  3:00 PM [name], MD [facility] None  ? ? ?------------------------------------------------------------------------------------------------------------------ ? ? ?HPI ?BP (!) 148/72   Pulse 88   Temp 97.7 ?F (36.5 ?C)   Wt 194 lb 3.2 oz (88.1 kg)   SpO2 97%   BMI 28.68 kg/m?  ? ? ?61 y.o.male presents for review of BP readings from in home automatic machine. Patient feels as though his in home BP readings are inaccurate d/t a faulty machine. He was requested to have his automatic machine calibrated with PCP to confirm readings in order to continue assessing medication effectiveness with the [place]. He is currently taking Losartan 100 mg daily as directed.  Reports that average automatic in-home readings systolically are [phone].  His last OFV was an annual physical completed on 06/06/21 and manual in office BP was 128/78.   ? ?Review of kidney fx and testosterone levels were WNL from 06/06/21.   ? ?He is  continuing to receive Testosterone injections every two weeks.  He is also titrating down and most recently received 1 mL.  ? ?Past Medical History:  ?Diagnosis Date  ? COPD (chronic obstructive pulmonary disease) ([place])   ? DDD (degenerative disc disease)   ? Depression 10/09/2013  ? Diabetes mellitus without complication ([place])   ? GERD (gastroesophageal reflux disease)   ? Heart murmur   ? 1971- per pt no issues reported by cardiology about murmur- Echo done  ? History of kidney stones   ? Hyperlipidemia   ? Hypertension   ? Hypogonadism male   ? Hypothyroidism   ? Typical atrial flutter ([place]) 2022  ? Vitamin D deficiency   ?  ? ?No Known Allergies ? ?Current Outpatient Medications on File Prior to Visit  ?Medication Sig  ? acetaminophen (TYLENOL) 500 MG tablet Take 1,000 mg by mouth every 6 (six) hours as needed for moderate pain.  ? cetirizine (ZYRTEC) 10 MG tablet Take 10 mg by mouth daily.  ? Cholecalciferol (VITAMIN D-3) 5000 UNITS TABS Take 5,000 Units by mouth daily.  ? cyclobenzaprine (FLEXERIL) 10 MG tablet Take 10 mg by mouth daily as needed for muscle spasms.  ? dextroamphetamine (DEXEDRINE SPANSULE) 15 MG 24 hr capsule Take 15 mg by mouth daily as needed (long/busy days).  ? ibuprofen (ADVIL) 200 MG tablet Take 600 mg by mouth every 6 (six) hours as needed for moderate pain.  ? levothyroxine (SYNTHROID, LEVOTHROID) 50 MCG tablet TAKE 1 TABLET BY MOUTH ONCE DAILY BEFORE BREAKFAST (Patient taking differently: Take 50 mcg by  mouth daily before breakfast.)  ? losartan (COZAAR) 100 MG tablet Take 100 mg by mouth daily.  ? metFORMIN (GLUCOPHAGE-XR) 750 MG 24 hr tablet Take 1,500 mg by mouth daily.  ? naproxen (NAPROSYN) 500 MG tablet Take 500 mg by mouth as needed.  ? omeprazole (PRILOSEC OTC) 20 MG tablet Take 20 mg by mouth daily.  ? tamsulosin (FLOMAX) 0.4 MG CAPS capsule Take 0.4 mg by mouth daily.  ? testosterone cypionate (DEPOTESTOSTERONE CYPIONATE) 200 MG/ML injection USE 2ML INTRAMUSCULARLY EVERY 2  WEEKS AS DIRECTED  ? traMADol (ULTRAM) 50 MG tablet Take 50 mg by mouth every 6 (six) hours as needed for pain.  ? vitamin C (ASCORBIC ACID) 500 MG tablet Take 1,000 mg by mouth daily.  ? zinc gluconate 50 MG tablet Take 50 mg by mouth daily.  ? B-D 3CC LUER-LOK SYR 21GX1" 21G X 1" 3 ML MISC USE AS DIRECTED  ? nicotine (NICODERM CQ - DOSED IN MG/24 HOURS) 21 mg/24hr patch 1 patch daily. (Patient not taking: Reported on 07/11/2021)  ? ONE TOUCH ULTRA TEST test strip TEST BLOOD SUGAR 3 TIMES A DAY  ? ONETOUCH DELICA LANCETS 16P MISC USE TO CHECK SUGAR DAILY  ? traMADol (ULTRAM) 50 MG tablet Take 1 tablet by mouth as needed.  ? ?No current facility-administered medications on file prior to visit.  ? ? ?ROS: all negative except what is noted in the HPI.  ? ?Physical Exam: ? ?BP (!) 148/72   Pulse 88   Temp 97.7 ?F (36.5 ?C)   Wt 194 lb 3.2 oz (88.1 kg)   SpO2 97%   BMI 28.68 kg/m?  ? ?General Appearance: NAD.  Awake, conversant and cooperative. ?Eyes: PERRLA, EOMs intact.  Sclera white.  Conjunctiva without erythema. ?Sinuses: No frontal/maxillary tenderness.  No nasal discharge. Nares patent.  ?ENT/Mouth: Ext aud canals clear.  Bilateral TMs w/DOL and without erythema or bulging. Hearing intact.  Posterior pharynx without swelling or exudate.  Tonsils without swelling or erythema.  ?Neck: Supple.  No masses, nodules or thyromegaly. ?Respiratory: Effort is regular with non-labored breathing. Breath sounds are equal bilaterally without rales, rhonchi, wheezing or stridor.  ?Cardio: RRR with no MRGs. Brisk peripheral pulses without edema.  ?Abdomen: Active BS in all four quadrants.  Soft and non-tender without guarding, rebound tenderness, hernias or masses. ?Lymphatics: Non tender without lymphadenopathy.  ?Musculoskeletal: Full ROM, 5/5 strength, normal ambulation.  No clubbing or cyanosis. ?Skin: Appropriate color for ethnicity. Warm without rashes, lesions, ecchymosis, ulcers.  ?Neuro: CN II-XII grossly normal.  Normal muscle tone without cerebellar symptoms and intact sensation.   ?Psych: AO X 3,  appropriate mood and affect, insight and judgment.  ?  ? ?Darrol Jump, NP ?2:54 PM ?[facility] ? ?

## 2021-07-13 NOTE — Progress Notes (Signed)
? ? ? ?07/14/2021 ?Paul Gates ?[id number] ?[birth year] ? ? ?ASSESSMENT AND PLAN:  ? ?Positive colorectal cancer screening using Cologuard test ?Will schedule for colonoscopy ? ?Iron deficiency anemia, unspecified iron deficiency anemia type ?Elevated hemoglobin but patient has testosterone therapy, has COPD, continues to smoke likely causing false elevation. ?Iron was 25 05/2021 ?I recommend upper gastrointestinal and colorectal evaluation with an EGD and colonoscopy. The rationale for this was discussed.  ?Risk of bowel prep, conscious sedation, and EGD and colonoscopy were discussed.  ?Risks include but are not limited to dehydration, pain, bleeding, cardiopulmonary process, bowel perforation, or other possible adverse outcomes. ?Treatment plan was discussed with patient, and agreed upon. ? ?Gastroesophageal reflux disease without esophagitis ?S/p H pylori 1990's, continuing GERD, some NSAID use, smoker.  ?Continue PPI ?Will schedule for EGD ? ?Atrial flutter, unspecified type ([place]) ?Off Eliquis at this time, NSR s/p ablation ?Normal Echo 04/2021 ? 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 ?Chronic obstructive pulmonary disease, unspecified COPD type ([place]) ?Discussed smoking cessation ?Not on O2 ? ? ?Patient Care Team: ?[name], MD as PCP - General (Internal Medicine) ? ?HISTORY OF PRESENT ILLNESS: ?61 y.o. male referred by [name], MD, with a past medical history of type 2 diabetes with stage II kidney disease, hyperlipidemia, COPD, atrial flutter status post ablation 03/15/2021 Eliquis discontinued post ablation (04/26/2021 echocardiogram EF 55-60%, no valvular abnormalities) and others listed below presents for evaluation of positive Cologuard with IDA.  ?08/2013 screening colonoscopy with Dr. Olevia Perches.  Small internal hemorrhoids otherwise normal recall 10 years. ?EGD 1992 with H pylori per patient.  ? ?Denies SOB, chest pain, dizziness.  ?Has not had any further flutter  since ablation in 03/15/2021.  ?He is not long on Eliquis and was cleared.  ?Has history of H pylori infection. ?He has GERD if he eats too much, can have nocturnal symptoms with acid into his throat.  ?Denies dysphagia, nausea, vomiting, melena.  ?Denies AB pain.  ?He is on naproxen '500mg'$  1 pill 3 x a week, 1 beer or shot of vodka 2-3 x week.  ?BM daily, having formed occ harder stool or some pressure to get BM out. Patient denies change in bowel habits, constipation, diarrhea, hematochezia.  ?Denies changes in appetite, unintentional weight loss.  ?No family history of GI malignancy. ? ? ?External labs and notes reviewed this visit: ?CBC  06/06/2021  ?HGB 16.1 MCV 89.9 without evidence of anemia patient is on testosterone therapy, has COPD, continues to smoke, does have iron deficiency anemia. ?WBC 12.5 Platelets 272 ?Anemia panel 06/06/2021  ?Iron 25   ?06/06/2021  B12 612 ?Kidney function 06/06/2021  ?BUN 14 Cr 1.00  ?GFR >60  ?Potassium 4.2   ?LFTs 06/06/2021  ?AST 17 ALT 30 ?06/06/2021 TSH 3.96 ? ?Current Medications:  ? ?Current Outpatient Medications (Endocrine & Metabolic):  ?  levothyroxine (SYNTHROID, LEVOTHROID) 50 MCG tablet, TAKE 1 TABLET BY MOUTH ONCE DAILY BEFORE BREAKFAST (Patient taking differently: Take 50 mcg by mouth daily before breakfast.) ?  metFORMIN (GLUCOPHAGE-XR) 750 MG 24 hr tablet, Take 1,500 mg by mouth daily. ?  testosterone cypionate (DEPOTESTOSTERONE CYPIONATE) 200 MG/ML injection, USE 2ML INTRAMUSCULARLY EVERY 2 WEEKS AS DIRECTED ? ?Current Outpatient Medications (Cardiovascular):  ?  losartan (COZAAR) 100 MG tablet, Take 100 mg by mouth daily. ? ?Current Outpatient Medications (Respiratory):  ?  cetirizine (ZYRTEC) 10 MG tablet, Take 10 mg by mouth daily. ? ?Current Outpatient Medications (Analgesics):  ?  acetaminophen (TYLENOL) 500  MG tablet, Take 1,000 mg by mouth every 6 (six) hours as needed for moderate pain. ?  ibuprofen (ADVIL) 200 MG tablet, Take 600 mg by mouth every 6 (six)  hours as needed for moderate pain. ?  naproxen (NAPROSYN) 500 MG tablet, Take 500 mg by mouth as needed. ?  traMADol (ULTRAM) 50 MG tablet, Take 50 mg by mouth every 6 (six) hours as needed for pain. ?  traMADol (ULTRAM) 50 MG tablet, Take 1 tablet by mouth as needed. ? ? ?Current Outpatient Medications (Other):  ?  B-D 3CC LUER-LOK SYR 21GX1" 21G X 1" 3 ML MISC, USE AS DIRECTED ?  Cholecalciferol (VITAMIN D-3) 5000 UNITS TABS, Take 5,000 Units by mouth daily. ?  cyclobenzaprine (FLEXERIL) 10 MG tablet, Take 10 mg by mouth daily as needed for muscle spasms. ?  dextroamphetamine (DEXEDRINE SPANSULE) 15 MG 24 hr capsule, Take 15 mg by mouth daily as needed (long/busy days). ?  omeprazole (PRILOSEC OTC) 20 MG tablet, Take 20 mg by mouth daily. ?  ONE TOUCH ULTRA TEST test strip, TEST BLOOD SUGAR 3 TIMES A DAY ?  ONETOUCH DELICA LANCETS 16X MISC, USE TO CHECK SUGAR DAILY ?  tamsulosin (FLOMAX) 0.4 MG CAPS capsule, Take 0.4 mg by mouth daily. ?  vitamin C (ASCORBIC ACID) 500 MG tablet, Take 1,000 mg by mouth daily. ?  zinc gluconate 50 MG tablet, Take 50 mg by mouth daily. ?  nicotine (NICODERM CQ - DOSED IN MG/24 HOURS) 21 mg/24hr patch, 1 patch daily. (Patient not taking: Reported on 07/11/2021) ? ?Medical History:  ?Past Medical History:  ?Diagnosis Date  ? COPD (chronic obstructive pulmonary disease) ([place])   ? DDD (degenerative disc disease)   ? Diabetes mellitus without complication ([place])   ? GERD (gastroesophageal reflux disease)   ? Heart murmur   ? 1971- per pt no issues reported by cardiology about murmur- Echo done  ? History of kidney stones   ? Hyperlipidemia   ? Hypertension   ? Hypogonadism male   ? Hypothyroidism   ? Typical atrial flutter ([place]) 2022  ? Vitamin D deficiency   ? ?Allergies: No Known Allergies  ? 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 ?Family History:  ?His family history includes Alcohol abuse in his father; Cancer in his maternal grandmother, mother, and paternal grandfather; Clotting disorder in his father; Diabetes in his maternal uncle; Hypertension in his father and mother; Stroke in his mother. ?Social History:  ? reports that he has been smoking cigarettes. He started smoking about 43 years ago. He has been smoking an average of .25 packs per day. He has never used smokeless tobacco. He reports current alcohol use of about 4.0 standard drinks per week. He reports that he does not use drugs. ? ?REVIEW OF SYSTEMS  : All other systems reviewed and negative except where noted in the History of Present Illness. ? ? ?PHYSICAL EXAM: ?BP 130/84   Pulse 93   Ht '5\' 9"'$  (1.753 m)   Wt 191 lb 6.4 oz (86.8 kg)   SpO2 96%   BMI 28.26 kg/m?  ?General:   Pleasant, well developed male in no acute distress ?Head:  Normocephalic and atraumatic. ?Eyes: sclerae anicteric,conjunctive pink  ?Heart:  regular rate and rhythm ?Pulm: Clear anteriorly; no wheezing ?Abdomen:  Soft, Obese AB, skin  exam ventral and umblical hernia, Normal bowel sounds. mild tenderness in the RUQ. Without guarding and Without rebound, without hepatomegaly. ?Extremities:  With edema. ?Msk:  Symmetrical without gross deformities. Peripheral pulses intact.  ?Neurologic:  Alert and  oriented x4;  grossly normal neurologically. ?Skin:   Dry and intact without significant lesions or rashes. ?Psychiatric: Demonstrates good judgement and reason without abnormal affect or behaviors. ? ? ?Paul Crofts, PA-C ?2:25 PM ? ? ?

## 2021-07-14 ENCOUNTER — Ambulatory Visit: Payer: 59 | Admitting: Physician Assistant

## 2021-07-14 ENCOUNTER — Encounter: Payer: Self-pay | Admitting: Physician Assistant

## 2021-07-14 VITALS — BP 130/84 | HR 93 | Ht 69.0 in | Wt 191.4 lb

## 2021-07-14 DIAGNOSIS — D509 Iron deficiency anemia, unspecified: Secondary | ICD-10-CM | POA: Diagnosis not present

## 2021-07-14 DIAGNOSIS — K219 Gastro-esophageal reflux disease without esophagitis: Secondary | ICD-10-CM | POA: Diagnosis not present

## 2021-07-14 DIAGNOSIS — R195 Other fecal abnormalities: Secondary | ICD-10-CM

## 2021-07-14 DIAGNOSIS — I4892 Unspecified atrial flutter: Secondary | ICD-10-CM

## 2021-07-14 DIAGNOSIS — E1129 Type 2 diabetes mellitus with other diabetic kidney complication: Secondary | ICD-10-CM

## 2021-07-14 DIAGNOSIS — J449 Chronic obstructive pulmonary disease, unspecified: Secondary | ICD-10-CM

## 2021-07-14 MED ORDER — PLENVU 140 G PO SOLR: 1.0000 | Freq: Once | ORAL | 0 refills | Status: AC

## 2021-07-14 NOTE — Patient Instructions (Signed)
If you are age 59 or older, your body mass index should be between 23-30. Your Body mass index is 28.26 kg/m?Marland Kitchen If this is out of the aforementioned range listed, please consider follow up with your Primary Care Provider. ? ?If you are age 48 or younger, your body mass index should be between 19-25. Your Body mass index is 28.26 kg/m?Marland Kitchen If this is out of the aformentioned range listed, please consider follow up with your Primary Care Provider.  ? ?________________________________________________________ ? ?The [facility] providers would like to encourage you to use [facility] to communicate with providers for non-urgent requests or questions.  Due to long hold times on the telephone, sending your provider a message by [facility] may be a faster and more efficient way to get a response.  Please allow 48 business hours for a response.  Please remember that this is for non-urgent requests.  ?_______________________________________________________ ? ?You have been scheduled for an endoscopy and colonoscopy. Please follow the written instructions given to you at your visit today. ?Please pick up your prep supplies at the pharmacy within the next 1-3 days. ?If you use inhalers (even only as needed), please bring them with you on the day of your procedure. ? ?

## 2021-07-15 NOTE — Progress Notes (Signed)
Addendum: Reviewed and agree with assessment and management plan. Joann Kulpa M, MD  

## 2021-08-19 ENCOUNTER — Encounter: Payer: Self-pay | Admitting: Internal Medicine

## 2021-08-26 ENCOUNTER — Encounter: Payer: Self-pay | Admitting: Internal Medicine

## 2021-08-26 ENCOUNTER — Ambulatory Visit (AMBULATORY_SURGERY_CENTER): Payer: 59 | Admitting: Internal Medicine

## 2021-08-26 VITALS — BP 108/62 | HR 102 | Temp 98.3°F | Resp 16 | Ht 69.0 in | Wt 191.0 lb

## 2021-08-26 DIAGNOSIS — K5730 Diverticulosis of large intestine without perforation or abscess without bleeding: Secondary | ICD-10-CM

## 2021-08-26 DIAGNOSIS — K219 Gastro-esophageal reflux disease without esophagitis: Secondary | ICD-10-CM | POA: Diagnosis not present

## 2021-08-26 DIAGNOSIS — R195 Other fecal abnormalities: Secondary | ICD-10-CM

## 2021-08-26 DIAGNOSIS — K2970 Gastritis, unspecified, without bleeding: Secondary | ICD-10-CM

## 2021-08-26 DIAGNOSIS — K2990 Gastroduodenitis, unspecified, without bleeding: Secondary | ICD-10-CM

## 2021-08-26 DIAGNOSIS — D123 Benign neoplasm of transverse colon: Secondary | ICD-10-CM

## 2021-08-26 DIAGNOSIS — K2950 Unspecified chronic gastritis without bleeding: Secondary | ICD-10-CM | POA: Diagnosis not present

## 2021-08-26 DIAGNOSIS — D122 Benign neoplasm of ascending colon: Secondary | ICD-10-CM | POA: Diagnosis not present

## 2021-08-26 MED ORDER — SODIUM CHLORIDE 0.9 % IV SOLN: 500.0000 mL | Freq: Once | INTRAVENOUS | Status: DC

## 2021-08-26 NOTE — Patient Instructions (Signed)
?Handouts on hemorrhoids and polyps given. ? ?YOU HAD AN ENDOSCOPIC PROCEDURE TODAY AT [facility]:   Refer to the procedure report that was given to you for any specific questions about what was found during the examination.  If the procedure report does not answer your questions, please call your gastroenterologist to clarify.  If you requested that your care partner not be given the details of your procedure findings, then the procedure report has been included in a sealed envelope for you to review at your convenience later. ? ?YOU SHOULD EXPECT: Some feelings of bloating in the abdomen. Passage of more gas than usual.  Walking can help get rid of the air that was put into your GI tract during the procedure and reduce the bloating. If you had a lower endoscopy (such as a colonoscopy or flexible sigmoidoscopy) you may notice spotting of blood in your stool or on the toilet paper. If you underwent a bowel prep for your procedure, you may not have a normal bowel movement for a few days. ? ?Please Note:  You might notice some irritation and congestion in your nose or some drainage.  This is from the oxygen used during your procedure.  There is no need for concern and it should clear up in a day or so. ? ?SYMPTOMS TO REPORT IMMEDIATELY: ? ?Following lower endoscopy (colonoscopy or flexible sigmoidoscopy): ? Excessive amounts of blood in the stool ? Significant tenderness or worsening of abdominal pains ? Swelling of the abdomen that is new, acute ? Fever of 100?F or higher ? ?Following upper endoscopy (EGD) ? Vomiting of blood or coffee ground material ? New chest pain or pain under the shoulder blades ? Painful or persistently difficult swallowing ? New shortness of breath ? Fever of 100?F or higher ? Black, tarry-looking stools ? ?For urgent or emergent issues, a gastroenterologist can be reached at any hour by calling [phone]. ?Do not use MyChart messaging for urgent concerns.  ? ? ?DIET:   We do recommend a small meal at first, but then you may proceed to your regular diet.  Drink plenty of fluids but you should avoid alcoholic beverages for 24 hours. ? ?ACTIVITY:  You should plan to take it easy for the rest of today and you should NOT DRIVE or use heavy machinery until tomorrow (because of the sedation medicines used during the test).   ? ?FOLLOW UP: ?Our staff will call the number listed on your records 48-72 hours following your procedure to check on you and address any questions or concerns that you may have regarding the information given to you following your procedure. If we do not reach you, we will leave a message.  We will attempt to reach you two times.  During this call, we will ask if you have developed any symptoms of COVID 19. If you develop any symptoms (ie: fever, flu-like symptoms, shortness of breath, cough etc.) before then, please call [phone].  If you test positive for Covid 19 in the 2 weeks post procedure, please call and report this information to [place].   ? ?If any biopsies were taken you will be contacted by phone or by letter within the next 1-3 weeks.  Please call us at [phone] if you have not heard about the biopsies in 3 weeks.  ? ? ?SIGNATURES/CONFIDENTIALITY: ?You and/or your care partner have signed paperwork which will be entered into your electronic medical record.  These signatures attest to the fact that  that the information above on your After Visit Summary has been reviewed and is understood.  Full responsibility of the confidentiality of this discharge information lies with you and/or your care-partner.  ?

## 2021-08-26 NOTE — Progress Notes (Signed)
VSS, transported to PACU °

## 2021-08-26 NOTE — Progress Notes (Signed)
? ?GASTROENTEROLOGY PROCEDURE H&P NOTE  ? ?Primary Care Physician: ?[name], MD ? ? ? ?Reason for Procedure:   Heme + stools, GERD ? ?Plan:    EGD and colonoscopy ? ?Patient is appropriate for endoscopic procedure(s) in the ambulatory ([facility]) setting. ? ?The nature of the procedure, as well as the risks, benefits, and alternatives were carefully and thoroughly reviewed with the patient. Ample time for discussion and questions allowed. The patient understood, was satisfied, and agreed to proceed.  ? ? ? ?HPI: ?Paul Gates is a 61 y.o. male who presents for EGD and colonoscopy.  Medical history as below.  Tolerated the prep.  No recent chest pain or shortness of breath.  No abdominal pain today. ? ?Past Medical History:  ?Diagnosis Date  ? Allergy   ? Blood transfusion without reported diagnosis   ? Chronic kidney disease   ? kidney stones  ? COPD (chronic obstructive pulmonary disease) ([place])   ? DDD (degenerative disc disease)   ? Diabetes mellitus without complication ([place])   ? GERD (gastroesophageal reflux disease)   ? Heart murmur   ? 1971- per pt no issues reported by cardiology about murmur- Echo done  ? History of kidney stones   ? Hyperlipidemia   ? Hypertension   ? Hypogonadism male   ? Hypothyroidism   ? Typical atrial flutter ([place]) 2022  ? Vitamin D deficiency   ? ? ?Past Surgical History:  ?Procedure Laterality Date  ? A-FLUTTER ABLATION N/A 02/01/2021  ? Procedure: A-FLUTTER ABLATION;  Surgeon: Thompson Grayer, MD;  Location: [facility];  Service: Cardiovascular;  Laterality: N/A;  ? 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 open heart  ? CERVICAL FUSION  04/24/1989  ? COLONOSCOPY    ? ESOPHAGOGASTRODUODENOSCOPY  [phone]  ? duodenal ulcer  ? LUMBAR DISC SURGERY    ? LUMBAR FUSION  04/25/1999  ? LUMBAR FUSION  02/2021  ? LUMBAR LAMINECTOMY  10/23/1995  ? UPPER GASTROINTESTINAL ENDOSCOPY    ? ? ?Prior to Admission medications   ?Medication Sig Start Date End Date Taking? Authorizing  Provider  ?B-D 3CC LUER-LOK SYR 21GX1" 21G X 1" 3 ML MISC USE AS DIRECTED 07/17/14  Yes [name], MD  ?cetirizine (ZYRTEC) 10 MG tablet Take 10 mg by mouth daily.   Yes [provider]  ?Cholecalciferol (VITAMIN D-3) 5000 UNITS TABS Take 5,000 Units by mouth daily.   Yes [provider]  ?cyclobenzaprine (FLEXERIL) 10 MG tablet Take 10 mg by mouth daily as needed for muscle spasms.   Yes [provider]  ?hydrochlorothiazide (HYDRODIURIL) 25 MG tablet Take 1 tablet by mouth daily. 08/17/21  Yes [provider]  ?levothyroxine (SYNTHROID, LEVOTHROID) 50 MCG tablet TAKE 1 TABLET BY MOUTH ONCE DAILY BEFORE BREAKFAST ?Patient taking differently: Take 50 mcg by mouth daily before breakfast. 05/11/14  Yes [name], MD  ?losartan (COZAAR) 100 MG tablet Take 100 mg by mouth daily.   Yes [provider]  ?metFORMIN (GLUCOPHAGE-XR) 750 MG 24 hr tablet Take 1,500 mg by mouth daily. 05/19/21  Yes [provider]  ?naproxen (NAPROSYN) 500 MG tablet Take 500 mg by mouth as needed. 05/05/21  Yes [provider]  ?ONE TOUCH ULTRA TEST test strip TEST BLOOD SUGAR 3 TIMES A DAY 09/28/14  Yes Vladimir Crofts, PA-C  ?ONETOUCH DELICA LANCETS 97K MISC USE TO CHECK SUGAR DAILY 01/09/14  Yes [name], MD  ?tamsulosin (FLOMAX) 0.4 MG CAPS capsule Take 0.4 mg by mouth  daily.   Yes [provider]  ?testosterone cypionate (DEPOTESTOSTERONE CYPIONATE) 200 MG/ML injection USE 2ML INTRAMUSCULARLY EVERY 2 WEEKS AS DIRECTED 07/29/19  Yes [name], MD  ?traMADol (ULTRAM) 50 MG tablet Take 50 mg by mouth every 6 (six) hours as needed for pain. 11/10/20  Yes [provider]  ?zinc gluconate 50 MG tablet Take 50 mg by mouth daily.   Yes [provider]  ?acetaminophen (TYLENOL) 500 MG tablet Take 1,000 mg by mouth every 6 (six) hours as needed for moderate pain.    [provider]  ?dextroamphetamine (DEXEDRINE SPANSULE) 15 MG 24  hr capsule Take 15 mg by mouth daily as needed (long/busy days).    [provider]  ?ibuprofen (ADVIL) 200 MG tablet Take 600 mg by mouth every 6 (six) hours as needed for moderate pain.    [provider]  ?nicotine (NICODERM CQ - DOSED IN MG/24 HOURS) 21 mg/24hr patch 1 patch daily. ?Patient not taking: Reported on 07/11/2021 05/27/21   [provider]  ?omeprazole (PRILOSEC OTC) 20 MG tablet Take 20 mg by mouth daily. ?Patient not taking: Reported on 08/26/2021    [provider]  ?vitamin C (ASCORBIC ACID) 500 MG tablet Take 1,000 mg by mouth daily.    [provider]  ? ? ?Current Outpatient Medications  ?Medication Sig Dispense Refill  ? B-D 3CC LUER-LOK SYR 21GX1" 21G X 1" 3 ML MISC USE AS DIRECTED 20 each 0  ? cetirizine (ZYRTEC) 10 MG tablet Take 10 mg by mouth daily.    ? Cholecalciferol (VITAMIN D-3) 5000 UNITS TABS Take 5,000 Units by mouth daily.    ? cyclobenzaprine (FLEXERIL) 10 MG tablet Take 10 mg by mouth daily as needed for muscle spasms.    ? hydrochlorothiazide (HYDRODIURIL) 25 MG tablet Take 1 tablet by mouth daily.    ? levothyroxine (SYNTHROID, LEVOTHROID) 50 MCG tablet TAKE 1 TABLET BY MOUTH ONCE DAILY BEFORE BREAKFAST (Patient taking differently: Take 50 mcg by mouth daily before breakfast.) 90 tablet 2  ? losartan (COZAAR) 100 MG tablet Take 100 mg by mouth daily.    ? metFORMIN (GLUCOPHAGE-XR) 750 MG 24 hr tablet Take 1,500 mg by mouth daily.    ? naproxen (NAPROSYN) 500 MG tablet Take 500 mg by mouth as needed.    ? ONE TOUCH ULTRA TEST test strip TEST BLOOD SUGAR 3 TIMES A DAY 100 each 3  ? ONETOUCH DELICA LANCETS 15Q MISC USE TO CHECK SUGAR DAILY 100 each 0  ? tamsulosin (FLOMAX) 0.4 MG CAPS capsule Take 0.4 mg by mouth daily.    ? testosterone cypionate (DEPOTESTOSTERONE CYPIONATE) 200 MG/ML injection USE 2ML INTRAMUSCULARLY EVERY 2 WEEKS AS DIRECTED 10 mL 2  ? traMADol (ULTRAM) 50 MG tablet Take 50 mg by mouth every 6 (six) hours as needed for  pain.    ? zinc gluconate 50 MG tablet Take 50 mg by mouth daily.    ? acetaminophen (TYLENOL) 500 MG tablet Take 1,000 mg by mouth every 6 (six) hours as needed for moderate pain.    ? dextroamphetamine (DEXEDRINE SPANSULE) 15 MG 24 hr capsule Take 15 mg by mouth daily as needed (long/busy days).    ? ibuprofen (ADVIL) 200 MG tablet Take 600 mg by mouth every 6 (six) hours as needed for moderate pain.    ? nicotine (NICODERM CQ - DOSED IN MG/24 HOURS) 21 mg/24hr patch 1 patch daily. (Patient not taking: Reported on 07/11/2021)    ? omeprazole (PRILOSEC  OTC) 20 MG tablet Take 20 mg by mouth daily. (Patient not taking: Reported on 08/26/2021)    ? vitamin C (ASCORBIC ACID) 500 MG tablet Take 1,000 mg by mouth daily.    ? ?Current Facility-Administered Medications  ?Medication Dose Route Frequency Provider Last Rate Last Admin  ? 0.9 %  sodium chloride infusion  500 mL Intravenous Once Talli Kimmer, Lajuan Lines, MD      ? ? ?Allergies as of 08/26/2021  ? (No Known Allergies)  ? ? ?Family History  ?Problem Relation Age of Onset  ? Hypertension Mother   ? Stroke Mother   ? Cancer Mother   ?     ovarian  ? Alcohol abuse Father   ? Hypertension Father   ? Clotting disorder Father   ?     antiphospholipid syndrome   ? Diabetes Maternal Uncle   ? Cancer Maternal Grandmother   ?     lung  ? Cancer Paternal Grandfather   ?     lung  ? Colon cancer Neg Hx   ? Stomach cancer Neg Hx   ? Esophageal cancer Neg Hx   ? Colon polyps Neg Hx   ? Rectal cancer Neg Hx   ? ? ?Social History  ? ?Socioeconomic History  ? Marital status: Single  ?  Spouse name: Not on file  ? Number of children: 0  ? Years of education: Not on file  ? Highest education level: Not on file  ?Occupational History  ? Not on file  ?Tobacco Use  ? Smoking status: Every Day  ?  Packs/day: 0.25  ?  Types: Cigarettes  ?  Start date: 38  ? Smokeless tobacco: Never  ? Tobacco comments:  ?  Currently down to smoking 0.5 pack   ?Vaping Use  ? Vaping Use: Never used  ?Substance and  Sexual Activity  ? Alcohol use: Yes  ?  Alcohol/week: 4.0 standard drinks  ?  Types: 4 Standard drinks or equivalent per week  ?  Comment: socially- 2-3 drinks weekly  ? Drug use: No  ? Sexual activity: No

## 2021-08-26 NOTE — Progress Notes (Signed)
Called to room to assist during endoscopic procedure.  Patient ID and intended procedure confirmed with present staff. Received instructions for my participation in the procedure from the performing physician.  

## 2021-08-26 NOTE — Progress Notes (Signed)
Pt reported he ate breakfast 08/25/21 and ate a taco at 11:00 also.  He drank all his prep Plenvu and reports clear yellow liquid.  ? ? ?

## 2021-08-26 NOTE — Op Note (Signed)
[facility] ?Patient Name: Paul Gates ?Procedure Date: 08/26/2021 2:06 PM ?MRN: [id number] ?Endoscopist: Jerene Bears , MD ?Age: 61 ?Referring MD:  ?Date of Birth: [birth year] ?Gender: Male ?Account #: [id number] ?Procedure:                Upper GI endoscopy ?Indications:              Gastro-esophageal reflux disease, Heme positive  ?                          stool, personal history of PUD and H. Pylori  ?                          infection (previously treated) ?Medicines:                Monitored Anesthesia Care ?Procedure:                Pre-Anesthesia Assessment: ?                          - Prior to the procedure, a History and Physical  ?                          was performed, and patient medications and  ?                          allergies were reviewed. The patient's tolerance of  ?                          previous anesthesia was also reviewed. The risks  ?                          and benefits of the procedure and the sedation  ?                          options and risks were discussed with the patient.  ?                          All questions were answered, and informed consent  ?                          was obtained. Prior Anticoagulants: The patient has  ?                          taken no previous anticoagulant or antiplatelet  ?                          agents. ASA Grade Assessment: III - A patient with  ?                          severe systemic disease. After reviewing the risks  ?                          and benefits, the patient was deemed in  ?  satisfactory condition to undergo the procedure. ?                          After obtaining informed consent, the endoscope was  ?                          passed under direct vision. Throughout the  ?                          procedure, the patient's blood pressure, pulse, and  ?                          oxygen saturations were monitored continuously. The  ?                          GIF HQ190 #[id number] was  introduced through the  ?                          mouth, and advanced to the second part of duodenum.  ?                          The upper GI endoscopy was accomplished without  ?                          difficulty. The patient tolerated the procedure  ?                          well. ?Scope In: ?Scope Out: ?Findings:                 The examined esophagus was normal. ?                          Patchy mild inflammation characterized by erythema  ?                          and granularity was found in the gastric body and  ?                          in the gastric antrum. Biopsies were taken with a  ?                          cold forceps for histology and Helicobacter pylori  ?                          testing. ?                          Diffuse moderate inflammation characterized by  ?                          congestion (edema), erosions, erythema and shallow  ?                          ulcerations was found in the duodenal bulb and in  ?  the second portion of the duodenum. Biopsies were  ?                          taken with a cold forceps for histology. ?Complications:            No immediate complications. ?Estimated Blood Loss:     Estimated blood loss was minimal. ?Impression:               - Normal esophagus. ?                          - Gastritis. Biopsied. ?                          - Duodenitis. Biopsied. ?Recommendation:           - Patient has a contact number available for  ?                          emergencies. The signs and symptoms of potential  ?                          delayed complications were discussed with the  ?                          patient. Return to normal activities tomorrow.  ?                          Written discharge instructions were provided to the  ?                          patient. ?                          - Resume previous diet. ?                          - Continue present medications. Increase omeprazole  ?                          to 40 mg  once daily. ?                          - Await pathology results. Await H. Pylori results  ?                          and treat if positive. ?Jerene Bears, MD ?08/26/2021 2:38:22 PM ?This report has been signed electronically. ?

## 2021-08-26 NOTE — Op Note (Signed)
[facility] ?Patient Name: Paul Gates ?Procedure Date: 08/26/2021 2:03 PM ?MRN: [id number] ?Endoscopist: Jerene Bears , MD ?Age: 61 ?Referring MD:  ?Date of Birth: 1960-08-03 ?Gender: Male ?Account #: [id number] ?Procedure:                Colonoscopy ?Indications:              Heme positive stool ?Medicines:                Monitored Anesthesia Care ?Procedure:                Pre-Anesthesia Assessment: ?                          - Prior to the procedure, a History and Physical  ?                          was performed, and patient medications and  ?                          allergies were reviewed. The patient's tolerance of  ?                          previous anesthesia was also reviewed. The risks  ?                          and benefits of the procedure and the sedation  ?                          options and risks were discussed with the patient.  ?                          All questions were answered, and informed consent  ?                          was obtained. Prior Anticoagulants: The patient has  ?                          taken no previous anticoagulant or antiplatelet  ?                          agents. ASA Grade Assessment: III - A patient with  ?                          severe systemic disease. After reviewing the risks  ?                          and benefits, the patient was deemed in  ?                          satisfactory condition to undergo the procedure. ?                          After obtaining informed consent, the colonoscope  ?  was passed under direct vision. Throughout the  ?                          procedure, the patient's blood pressure, pulse, and  ?                          oxygen saturations were monitored continuously. The  ?                          Olympus [id number] (#[id number]) Colonoscope was  ?                          introduced through the anus and advanced to the  ?                          cecum, identified by appendiceal orifice and   ?                          ileocecal valve. The colonoscopy was performed  ?                          without difficulty. The patient tolerated the  ?                          procedure well. The quality of the bowel  ?                          preparation was good. The ileocecal valve,  ?                          appendiceal orifice, and rectum were photographed. ?Scope In: 2:18:48 PM ?Scope Out: 2:34:12 PM ?Scope Withdrawal Time: 0 hours 13 minutes 47 seconds  ?Total Procedure Duration: 0 hours 15 minutes 24 seconds  ?Findings:                 The digital rectal exam was normal. ?                          A 4 mm polyp was found in the ascending colon. The  ?                          polyp was sessile. The polyp was removed with a  ?                          cold snare. Resection and retrieval were complete. ?                          A 6 mm polyp was found in the transverse colon. The  ?                          polyp was sessile. The polyp was removed with a  ?                          cold  snare. Resection and retrieval were complete. ?                          Multiple small-mouthed diverticula were found in  ?                          the sigmoid colon. ?                          Internal hemorrhoids were found during  ?                          retroflexion. The hemorrhoids were small. ?Complications:            No immediate complications. ?Estimated Blood Loss:     Estimated blood loss was minimal. ?Impression:               - One 4 mm polyp in the ascending colon, removed  ?                          with a cold snare. Resected and retrieved. ?                          - One 6 mm polyp in the transverse colon, removed  ?                          with a cold snare. Resected and retrieved. ?                          - Diverticulosis in the sigmoid colon. ?                          - Internal hemorrhoids. ?Recommendation:           - Patient has a contact number available for  ?                           emergencies. The signs and symptoms of potential  ?                          delayed complications were discussed with the  ?                          patient. Return to normal activities tomorrow.  ?                          Written discharge instructions were provided to the  ?                          patient. ?                          - Resume previous diet. ?                          - Continue present medications. ?                          -  Await pathology results. ?                          - Given EGD/colonoscopy today, gastroduodenitis is  ?                          felt to be the most likely explanation of heme +  ?                          stools without anemia. See EGD report. ?                          - Repeat colonoscopy is recommended. The  ?                          colonoscopy date will be determined after pathology  ?                          results from today's exam become available for  ?                          review. ?Jerene Bears, MD ?08/26/2021 2:41:03 PM ?This report has been signed electronically. ?

## 2021-08-29 ENCOUNTER — Telehealth: Payer: Self-pay | Admitting: Internal Medicine

## 2021-08-29 NOTE — Telephone Encounter (Signed)
?  Patient c/o Palpitations:  High priority if patient c/o lightheadedness, shortness of breath, or chest pain ? ?How long have you had palpitations/irregular HR/ Afib? Are you having the symptoms now? No  ? ?Are you currently experiencing lightheadedness, SOB or CP? uneasy feeling on his chest and sproridic headache ? ?Do you have a history of afib (atrial fibrillation) or irregular heart rhythm? Yes  ? ?Have you checked your BP or HR? (document readings if available): none  ? ?Are you experiencing any other symptoms? left lower leg above the ankle pain  ? ?Today 6:15 pm BP was "perfect" ?HR 52 - yesterday  ? ? ? ?

## 2021-08-29 NOTE — Telephone Encounter (Signed)
Follow up scheduled per Pt request. ? ?See MyChart message. ? ? ?

## 2021-08-30 ENCOUNTER — Telehealth: Payer: Self-pay

## 2021-08-30 NOTE — Telephone Encounter (Signed)
?  Follow up Call- ? ? ?  08/26/2021  ?  1:32 PM  ?Call back number  ?Post procedure Call Back phone  # [phone] cell  ?Permission to leave phone message Yes  ?  ? ?Patient questions: ? ?Do you have a fever, pain , or abdominal swelling? No. ?Pain Score  0 * ? ?Have you tolerated food without any problems? Yes.   ? ?Have you been able to return to your normal activities? Yes.   ? ?Do you have any questions about your discharge instructions: ?Diet   No. ?Medications  No. ?Follow up visit  No. ? ?Do you have questions or concerns about your Care? No. ? ?Actions: ?* If pain score is 4 or above: ?No action needed, pain <4. ? ? ?

## 2021-08-31 ENCOUNTER — Encounter: Payer: Self-pay | Admitting: Internal Medicine

## 2021-09-01 ENCOUNTER — Ambulatory Visit (HOSPITAL_BASED_OUTPATIENT_CLINIC_OR_DEPARTMENT_OTHER): Payer: 59 | Admitting: Internal Medicine

## 2021-09-01 ENCOUNTER — Encounter (HOSPITAL_BASED_OUTPATIENT_CLINIC_OR_DEPARTMENT_OTHER): Payer: Self-pay | Admitting: Internal Medicine

## 2021-09-01 VITALS — BP 128/88 | HR 66 | Ht 70.0 in | Wt 182.6 lb

## 2021-09-01 DIAGNOSIS — I10 Essential (primary) hypertension: Secondary | ICD-10-CM

## 2021-09-01 DIAGNOSIS — I483 Typical atrial flutter: Secondary | ICD-10-CM | POA: Diagnosis not present

## 2021-09-01 DIAGNOSIS — R002 Palpitations: Secondary | ICD-10-CM | POA: Diagnosis not present

## 2021-09-01 MED ORDER — HYDROCHLOROTHIAZIDE 12.5 MG PO CAPS: 12.5000 mg | ORAL_CAPSULE | Freq: Every day | ORAL | 3 refills | Status: DC

## 2021-09-01 NOTE — Patient Instructions (Addendum)
Medication Instructions:  ?Your physician has recommended you make the following change in your medication:  ? ? Decrease your hydrochlorothiazide (HCTZ)--Take 12.5 mg by mouth once a day ? ? ?Lab Work: ?You will get lab work today:  BMP, magnesium ? ?If you have labs (blood work) drawn today and your tests are completely normal, you will receive your results only by: ?MyChart Message (if you have MyChart) OR ?A paper copy in the mail ?If you have any lab test that is abnormal or we need to change your treatment, we will call you to review the results. ? ?Testing/Procedures: ?None ordered. ? ?Follow-Up: ?At [facility], you and your health needs are our priority.  As part of our continuing mission to provide you with exceptional heart care, we have created designated Provider Care Teams.  These Care Teams include your primary Cardiologist (physician) and Advanced Practice Providers (APPs -  Physician Assistants and Nurse Practitioners) who all work together to provide you with the care you need, when you need it. ? ?Your next appointment:   ?Your physician wants you to follow-up in: 4-6 weeks with Laurann Montana, NP ? ?You are advised to research a device to monitor your heart rhythms: ? ?KARDIA MOBILE ?[email or web address] ? ?Important Information About Sugar ? ? ? ? ? ? ? ? ?

## 2021-09-01 NOTE — Progress Notes (Signed)
? ? ?  ?Primary EP: Dr Rayann Heman ? ?Paul Gates is a 61 y.o. male who presents today for routine electrophysiology followup.  Since last being seen in our clinic, the patient reports doing reasonably well.    He has started hctz for elevated BP.  Several days later, he began having palpitations.  EKG today confirms sinus with PACs.  He is quite anxious about these.  His BP has been low on hctz as well.  Today, he denies symptoms of chest pain, shortness of breath,  lower extremity edema, dizziness, presyncope, or syncope.  The patient is otherwise without complaint today.  ? ?Past Medical History:  ?Diagnosis Date  ? Allergy   ? Blood transfusion without reported diagnosis   ? Chronic kidney disease   ? kidney stones  ? COPD (chronic obstructive pulmonary disease) ([place])   ? DDD (degenerative disc disease)   ? Diabetes mellitus without complication ([place])   ? GERD (gastroesophageal reflux disease)   ? Heart murmur   ? 1971- per pt no issues reported by cardiology about murmur- Echo done  ? History of kidney stones   ? Hyperlipidemia   ? Hypertension   ? Hypogonadism male   ? Hypothyroidism   ? Typical atrial flutter ([place]) 2022  ? Vitamin D deficiency   ? ?Past Surgical History:  ?Procedure Laterality Date  ? A-FLUTTER ABLATION N/A 02/01/2021  ? Procedure: A-FLUTTER ABLATION;  Surgeon: Thompson Grayer, MD;  Location: [facility];  Service: Cardiovascular;  Laterality: N/A;  ? 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 open heart  ? CERVICAL FUSION  04/24/1989  ? COLONOSCOPY    ? ESOPHAGOGASTRODUODENOSCOPY  [phone]  ? duodenal ulcer  ? LUMBAR DISC SURGERY    ? LUMBAR FUSION  04/25/1999  ? LUMBAR FUSION  02/2021  ? LUMBAR LAMINECTOMY  10/23/1995  ? UPPER GASTROINTESTINAL ENDOSCOPY    ? ? ?ROS- all systems are reviewed and negatives except as per HPI above ? ?Current Outpatient Medications  ?Medication Sig Dispense Refill  ? acetaminophen (TYLENOL) 500 MG tablet Take 1,000 mg by mouth every 6 (six) hours as needed  for moderate pain.    ? B-D 3CC LUER-LOK SYR 21GX1" 21G X 1" 3 ML MISC USE AS DIRECTED 20 each 0  ? cetirizine (ZYRTEC) 10 MG tablet Take 10 mg by mouth daily.    ? Cholecalciferol (VITAMIN D-3) 5000 UNITS TABS Take 5,000 Units by mouth daily.    ? cyclobenzaprine (FLEXERIL) 10 MG tablet Take 10 mg by mouth daily as needed for muscle spasms.    ? dextroamphetamine (DEXEDRINE SPANSULE) 15 MG 24 hr capsule Take 15 mg by mouth daily as needed (long/busy days).    ? hydrochlorothiazide (HYDRODIURIL) 25 MG tablet Take 1 tablet by mouth daily.    ? ibuprofen (ADVIL) 200 MG tablet Take 600 mg by mouth every 6 (six) hours as needed for moderate pain.    ? levothyroxine (SYNTHROID, LEVOTHROID) 50 MCG tablet TAKE 1 TABLET BY MOUTH ONCE DAILY BEFORE BREAKFAST (Patient taking differently: Take 50 mcg by mouth daily before breakfast.) 90 tablet 2  ? losartan (COZAAR) 100 MG tablet Take 100 mg by mouth daily.    ? metFORMIN (GLUCOPHAGE-XR) 750 MG 24 hr tablet Take 1,500 mg by mouth daily.    ? naproxen (NAPROSYN) 500 MG tablet Take 500 mg by mouth as needed.    ? nicotine (NICODERM CQ - DOSED IN MG/24 HOURS) 21 mg/24hr patch 1 patch daily.    ?  omeprazole (PRILOSEC OTC) 20 MG tablet Take 20 mg by mouth daily.    ? ONE TOUCH ULTRA TEST test strip TEST BLOOD SUGAR 3 TIMES A DAY 100 each 3  ? ONETOUCH DELICA LANCETS 74B MISC USE TO CHECK SUGAR DAILY 100 each 0  ? tamsulosin (FLOMAX) 0.4 MG CAPS capsule Take 0.4 mg by mouth daily.    ? traMADol (ULTRAM) 50 MG tablet Take 50 mg by mouth every 6 (six) hours as needed for pain.    ? vitamin C (ASCORBIC ACID) 500 MG tablet Take 1,000 mg by mouth daily.    ? zinc gluconate 50 MG tablet Take 50 mg by mouth daily.    ? ?No current facility-administered medications for this visit.  ? ? ?Physical Exam: ?Vitals:  ? 09/01/21 0856  ?BP: 128/88  ?Pulse: 66  ?Weight: 182 lb 9.6 oz (82.8 kg)  ?Height: '5\' 10"'$  (1.778 m)  ? ? ?GEN- The patient is well appearing, alert and oriented x 3 today.   ?Head-  normocephalic, atraumatic ?Eyes-  Sclera clear, conjunctiva pink ?Ears- hearing intact ?Oropharynx- clear ?Lungs- Clear to ausculation bilaterally, normal work of breathing ?Heart- Regular rate and rhythm, no murmurs, rubs or gallops, PMI not laterally displaced ?GI- soft, NT, ND, + BS ?Extremities- no clubbing, cyanosis, or edema ? ?Wt Readings from Last 3 Encounters:  ?09/01/21 182 lb 9.6 oz (82.8 kg)  ?08/26/21 191 lb (86.6 kg)  ?07/14/21 191 lb 6.4 oz (86.8 kg)  ? ? ? ?EKG tracing ordered today is personally reviewed and shows sinus with PACs in trigmeny ? ?Assessment and Plan: ? ?Typical atrial flutter/ palpitations ?Atrial flutter is resolved post ablation ?He is now having palpitations, likely due to PACs ?We discussed monitoring options.  He would like to try [employer].  I have provided him with information to order this.  Possibly related to low K or low BP.  Reduce HCTZ (see below).  Check bmet, mg today ?No OAC required currently ? ?2. HTN ?Low by home journal (reviewed) ?Reduce hctz to 12.'5mg'$  daily today ?Hopefully well can wean this to off with sodium restriction and tobacco cessation ? ?3. Nonischemic CM ?Normalized with sinus rhythm ? ?Risks, benefits and potential toxicities for medications prescribed and/or refilled reviewed with patient today.  ? ?Return to see cardiology APP in 4-6 weeks to reassess BP and palpitations ? ?Thompson Grayer MD, FACC ?09/01/2021 ?9:19 AM ? ? ? ? ?

## 2021-09-02 LAB — BASIC METABOLIC PANEL
BUN/Creatinine Ratio: 22 — ABNORMAL HIGH (ref 9–20)
BUN: 22 mg/dL (ref 6–24)
CO2: 21 mmol/L (ref 20–29)
Calcium: 10.1 mg/dL (ref 8.7–10.2)
Chloride: 102 mmol/L (ref 96–106)
Creatinine, Ser: 1.02 mg/dL (ref 0.76–1.27)
Glucose: 185 mg/dL — ABNORMAL HIGH (ref 70–99)
Potassium: 5.4 mmol/L — ABNORMAL HIGH (ref 3.5–5.2)
Sodium: 140 mmol/L (ref 134–144)
eGFR: 85 mL/min/{1.73_m2} (ref >59)

## 2021-09-02 LAB — MAGNESIUM: Magnesium: 2.3 mg/dL (ref 1.6–2.3)

## 2021-09-07 NOTE — Progress Notes (Signed)
` FOLLOW UP  Assessment and Plan:   Hypertension Fairly controlled with current medications  Monitor blood pressure at home; patient to call if consistently greater than 130/80 Continue DASH diet.   Reminder to go to the ER if any CP, SOB, nausea, dizziness, severe HA, changes vision/speech, left arm numbness and tingling and jaw pain.  - CBC - CMP  Hyperlipidemia associated with T2DM (HCC) Currently at LDL goal (<70) by lifestyle, omega 3  Discussed consider low dose statin for risk reduction  Continue low cholesterol diet and exercise.  Check lipid panel.   Diabetes with diabetic chronic kidney disease ([place]) Continue medication: metformin 750 mg 2 tabs daily Continue diet and exercise, progress with weight loss, goal to get off diabetic medications Low glycemic index diet reviewed today  Perform daily foot/skin check, notify office of any concerning changes.  Check A1C  CKD II associated with T2DM (HCC) Increase fluids, avoid NSAIDS, monitor sugars, will monitor  Overweight with co morbidities Long discussion about weight loss, diet, and exercise Recommended diet heavy in fruits and veggies and low in animal meats, cheeses, and dairy products, appropriate calorie intake Discussed ideal weight for height and initial weight goal (175 lb)  Patient will work on continue to make sustainable small good choices Motivated to reverse diabetes Will follow up in 3 months  Vitamin D Def At goal at last chec; continue supplementation Defer vit D level to CPE  Hypogonadism - continue to monitor, states medication is helping with symptoms of low T.   Hypothyroidism continue medications the same: reminded to take on an empty stomach 30-57mns before food.  check TSH level  Tobacco use Discussed risks associated with tobacco use and advised to reduce or quit Patient stopped smoking 8 days ago, using lozenges Will follow up at the next visit  ADD Continue medications Dexedrine  as needed Helps with focus, no AE's; uses rarely The patient was counseled on the addictive nature of the medication and was encouraged to take drug holidays when not needed.   PAC Continue HCTZ 12.5 mg Continue follow up with Dr. ARayann Heman cardiology   Continue diet and meds as discussed. Further disposition pending results of labs. Discussed med's effects and SE's.   Over 30 minutes of exam, counseling, chart review, and critical decision making was performed.   Future Appointments  Date Time Provider [place] 09/08/2021  4:00 PM MMagda Bernheim NP [facility] None  09/29/2021  3:10 PM WLoel Dubonnet NP [facility]  12/12/2021  4:00 PM MUnk Pinto MD [facility] None  06/12/2022  3:00 PM MUnk Pinto MD [facility] None    ----------------------------------------------------------------------------------------------------------------------  HPI 61y.o. male  presents for 3 month follow up on hypertension, cholesterol, T2 diabetes, weight, hypothyroid, tobacco use, hypogonadism on testosterone supplement, ADD and vitamin D deficiency.    [place]   he currently continues to smoke 1/2 pack (down from 1 pack average since 1980, does have 30+ pack year history); discussed risks associated with smoking, patient is ready to quit. Quit 8 days ago and has sneaked only a couple of cigarettes.  Is using lozenges to help.   He has COPD per imaging, CXR. Denies coughing, dyspnea, wheezing. Discussed low dose CT, would consider but wants to hold off until CPE.    Patient is on an ADD medication,  15 mg dextroamphetamine PRN; he takes very rarely - typically 1-2 occasions a  month. He states that the medication is helping and he denies any adverse reactions.  He was evaluated by  Dr. Rayann Heman- cardiology on 09/01/21 and was shown to have PAC's. His HCTZ was decreased to 12.5 mg. Atrial flutter resolved post ablation.  Has follow up scheduled for 09/29/21. He has noted less PAC's since lowering dose.   BMI is Body mass index is 26.4 kg/m., he has been working on diet but not much exercise. He does plan to start biking again. He has cut out sweet tea, he does drink some soda, does have coffee with sugar. He has made small adjustments. Weight goal 175 lb.  Wt Readings from Last 3 Encounters:  09/08/21 184 lb (83.5 kg)  09/01/21 182 lb 9.6 oz (82.8 kg)  08/26/21 191 lb (86.6 kg)   His blood pressure has been controlled at home, today their BP is BP: 116/62   BP Readings from Last 3 Encounters:  09/08/21 116/62  09/01/21 128/88  08/26/21 108/62   On losartan for renal benefits with T2DM.   He does workout. He denies chest pain, shortness of breath, dizziness.   He is not on cholesterol medication, only taking fish oil  and denies myalgias. His LDL cholesterol is at goal, trigs on non-fasting labs are mildly elevated. The cholesterol last visit was:   Lab Results  Component Value Date   CHOL 117 06/06/2021   HDL 29 (L) 06/06/2021   LDLCALC 52 06/06/2021   TRIG 342 (H) 06/06/2021   CHOLHDL 4.0 06/06/2021    He has been working on diet and exercise for T2DM (controlled in normal range on metformin 1500 mg daily) and denies increased appetite, nausea, paresthesia of the feet, polydipsia, polyuria, visual disturbances and vomiting.  He admits hasn't checked glucose in a while-  Last A1C in the office was:  Lab Results  Component Value Date   HGBA1C 6.0 (H) 06/06/2021    He has CKD II associated with T2DM monitored at this office:  Lab Results  Component Value Date   EGFR 85 09/01/2021    He is on thyroid medication. His medication was not changed last visit.   Lab Results  Component Value Date   TSH 3.96 06/06/2021   Patient is on Vitamin D supplement and at goal at last check:    Lab Results  Component Value Date   VD25OH 73 06/06/2021     He has history of testosterone deficiency and is on  testosterone replacement, taking 400 mg every 2 weeks, last shot 09/04/21. Is going to stop taking injections  Lab Results  Component Value Date   TESTOSTERONE 653 06/06/2021     Current Medications:  Current Outpatient Medications on File Prior to Visit  Medication Sig   acetaminophen (TYLENOL) 500 MG tablet Take 1,000 mg by mouth every 6 (six) hours as needed for moderate pain.   cetirizine (ZYRTEC) 10 MG tablet Take 10 mg by mouth daily.   Cholecalciferol (VITAMIN D-3) 5000 UNITS TABS Take 5,000 Units by mouth daily.   cyclobenzaprine (FLEXERIL) 10 MG tablet Take 10 mg by mouth daily as needed for muscle spasms.   dextroamphetamine (DEXEDRINE SPANSULE) 15 MG 24 hr capsule Take 15 mg by mouth daily as needed (long/busy days).   hydrochlorothiazide (MICROZIDE) 12.5 MG capsule Take 1 capsule (12.5 mg total) by mouth daily.   ibuprofen (ADVIL) 200 MG tablet Take 600 mg by mouth every 6 (six) hours as needed for moderate pain. Only PRN   levothyroxine (SYNTHROID, LEVOTHROID)  50 MCG tablet TAKE 1 TABLET BY MOUTH ONCE DAILY BEFORE BREAKFAST (Patient taking differently: Take 50 mcg by mouth daily before breakfast.)   losartan (COZAAR) 100 MG tablet Take 100 mg by mouth daily.   metFORMIN (GLUCOPHAGE-XR) 750 MG 24 hr tablet Take 1,500 mg by mouth daily.   naproxen (NAPROSYN) 500 MG tablet Take 500 mg by mouth as needed.   omeprazole (PRILOSEC OTC) 20 MG tablet Take 20 mg by mouth daily.   tamsulosin (FLOMAX) 0.4 MG CAPS capsule Take 0.4 mg by mouth daily.   traMADol (ULTRAM) 50 MG tablet Take 50 mg by mouth every 6 (six) hours as needed for pain.   vitamin C (ASCORBIC ACID) 500 MG tablet Take 1,000 mg by mouth daily.   zinc gluconate 50 MG tablet Take 50 mg by mouth daily.   B-D 3CC LUER-LOK SYR 21GX1" 21G X 1" 3 ML MISC USE AS DIRECTED   nicotine (NICODERM CQ - DOSED IN MG/24 HOURS) 21 mg/24hr patch 1 patch daily. (Patient not taking: Reported on 09/08/2021)   ONE TOUCH ULTRA TEST test strip  TEST BLOOD SUGAR 3 TIMES A DAY   ONETOUCH DELICA LANCETS 26V MISC USE TO CHECK SUGAR DAILY   No current facility-administered medications on file prior to visit.     Allergies: No Known Allergies   Medical History:  Past Medical History:  Diagnosis Date   Allergy    Blood transfusion without reported diagnosis    Chronic kidney disease    kidney stones   COPD (chronic obstructive pulmonary disease) (HCC)    DDD (degenerative disc disease)    Diabetes mellitus without complication (HCC)    GERD (gastroesophageal reflux disease)    Heart murmur    1971- per pt no issues reported by cardiology about murmur- Echo done   History of kidney stones    Hyperlipidemia    Hypertension    Hypogonadism male    Hypothyroidism    Typical atrial flutter ([place]) 2022   Vitamin D deficiency    Family history- Reviewed and unchanged Social history- Reviewed and unchanged   Review of Systems:  Review of Systems  Constitutional:  Negative for malaise/fatigue and weight loss.  HENT:  Negative for hearing loss and tinnitus.   Eyes:  Negative for blurred vision and double vision.  Respiratory:  Negative for cough, shortness of breath and wheezing.   Cardiovascular:  Negative for chest pain, palpitations, orthopnea, claudication and leg swelling.  Gastrointestinal:  Negative for abdominal pain, blood in stool, constipation, diarrhea, heartburn, melena, nausea and vomiting.  Genitourinary: Negative.   Musculoskeletal:  Negative for joint pain and myalgias.  Skin:  Negative for rash.  Neurological:  Negative for dizziness, tingling, sensory change, weakness and headaches.  Endo/Heme/Allergies:  Negative for polydipsia.  Psychiatric/Behavioral: Negative.    All other systems reviewed and are negative.   Physical Exam: BP 116/62   Pulse 92   Temp 97.7 F (36.5 C)   Wt 184 lb (83.5 kg)   SpO2 97%   BMI 26.40 kg/m  Wt Readings from Last 3 Encounters:  09/08/21 184 lb (83.5 kg)  09/01/21  182 lb 9.6 oz (82.8 kg)  08/26/21 191 lb (86.6 kg)   General Appearance: Well nourished, in no apparent distress. Eyes: PERRLA, EOMs, conjunctiva no swelling or erythema Sinuses: No Frontal/maxillary tenderness ENT/Mouth: Ext aud canals clear, TMs without erythema, bulging. No erythema, swelling, or exudate on post pharynx.  Tonsils not swollen or erythematous. Hearing normal.  Neck: Supple, thyroid  normal.  Respiratory: Respiratory effort normal, BS equal bilaterally without rales, rhonchi, wheezing or stridor.  Cardio: RRR with no MRGs. Brisk peripheral pulses without edema.  Abdomen: Soft, + BS.  Non tender, no guarding, rebound, hernias, masses. Lymphatics: Non tender without lymphadenopathy.  Musculoskeletal: Full ROM, 5/5 strength, Normal gait Skin: Warm, dry without rashes, lesions, ecchymosis.  Neuro: Cranial nerves intact. No cerebellar symptoms.  Psych: Awake and oriented X 3, normal affect, Insight and Judgment appropriate.    Magda Bernheim, NP 3:55 PM [facility]

## 2021-09-08 ENCOUNTER — Ambulatory Visit (INDEPENDENT_AMBULATORY_CARE_PROVIDER_SITE_OTHER): Payer: 59 | Admitting: Nurse Practitioner

## 2021-09-08 ENCOUNTER — Encounter: Payer: Self-pay | Admitting: Nurse Practitioner

## 2021-09-08 VITALS — BP 116/62 | HR 92 | Temp 97.7°F | Wt 184.0 lb

## 2021-09-08 DIAGNOSIS — I491 Atrial premature depolarization: Secondary | ICD-10-CM

## 2021-09-08 DIAGNOSIS — E663 Overweight: Secondary | ICD-10-CM

## 2021-09-08 DIAGNOSIS — E1169 Type 2 diabetes mellitus with other specified complication: Secondary | ICD-10-CM

## 2021-09-08 DIAGNOSIS — F988 Other specified behavioral and emotional disorders with onset usually occurring in childhood and adolescence: Secondary | ICD-10-CM

## 2021-09-08 DIAGNOSIS — F17200 Nicotine dependence, unspecified, uncomplicated: Secondary | ICD-10-CM

## 2021-09-08 DIAGNOSIS — I10 Essential (primary) hypertension: Secondary | ICD-10-CM

## 2021-09-08 DIAGNOSIS — E039 Hypothyroidism, unspecified: Secondary | ICD-10-CM | POA: Diagnosis not present

## 2021-09-08 DIAGNOSIS — E291 Testicular hypofunction: Secondary | ICD-10-CM

## 2021-09-08 DIAGNOSIS — E1122 Type 2 diabetes mellitus with diabetic chronic kidney disease: Secondary | ICD-10-CM | POA: Diagnosis not present

## 2021-09-08 DIAGNOSIS — E785 Hyperlipidemia, unspecified: Secondary | ICD-10-CM

## 2021-09-08 DIAGNOSIS — N182 Chronic kidney disease, stage 2 (mild): Secondary | ICD-10-CM

## 2021-09-09 LAB — COMPLETE METABOLIC PANEL WITH GFR
AG Ratio: 1.9 (calc) (ref 1.0–2.5)
ALT: 40 U/L (ref 9–46)
AST: 20 U/L (ref 10–35)
Albumin: 4.5 g/dL (ref 3.6–5.1)
Alkaline phosphatase (APISO): 85 U/L (ref 35–144)
BUN: 22 mg/dL (ref 7–25)
CO2: 23 mmol/L (ref 20–32)
Calcium: 9.8 mg/dL (ref 8.6–10.3)
Chloride: 101 mmol/L (ref 98–110)
Creat: 0.93 mg/dL (ref 0.70–1.30)
Globulin: 2.4 g/dL (calc) (ref 1.9–3.7)
Glucose, Bld: 116 mg/dL — ABNORMAL HIGH (ref 65–99)
Potassium: 4.2 mmol/L (ref 3.5–5.3)
Sodium: 136 mmol/L (ref 135–146)
Total Bilirubin: 0.2 mg/dL (ref 0.2–1.2)
Total Protein: 6.9 g/dL (ref 6.1–8.1)
eGFR: 95 mL/min/{1.73_m2} (ref >=60)

## 2021-09-09 LAB — TSH: TSH: 2.11 mIU/L (ref 0.40–4.50)

## 2021-09-09 LAB — LIPID PANEL
Cholesterol: 135 mg/dL (ref <200)
HDL: 29 mg/dL — ABNORMAL LOW (ref >=40)
LDL Cholesterol (Calc): 65 mg/dL (calc)
Non-HDL Cholesterol (Calc): 106 mg/dL (calc) (ref <130)
Total CHOL/HDL Ratio: 4.7 (calc) (ref <5.0)
Triglycerides: 337 mg/dL — ABNORMAL HIGH (ref <150)

## 2021-09-09 LAB — CBC WITH DIFFERENTIAL/PLATELET
Absolute Monocytes: 889 cells/uL (ref 200–950)
Basophils Absolute: 34 cells/uL (ref 0–200)
Basophils Relative: 0.3 %
Eosinophils Absolute: 114 cells/uL (ref 15–500)
Eosinophils Relative: 1 %
HCT: 45 % (ref 38.5–50.0)
Hemoglobin: 15.5 g/dL (ref 13.2–17.1)
Lymphs Abs: 3397 cells/uL (ref 850–3900)
MCH: 30.6 pg (ref 27.0–33.0)
MCHC: 34.4 g/dL (ref 32.0–36.0)
MCV: 88.8 fL (ref 80.0–100.0)
MPV: 11.9 fL (ref 7.5–12.5)
Monocytes Relative: 7.8 %
Neutro Abs: 6965 cells/uL (ref 1500–7800)
Neutrophils Relative %: 61.1 %
Platelets: 236 10*3/uL (ref 140–400)
RBC: 5.07 10*6/uL (ref 4.20–5.80)
RDW: 13.3 % (ref 11.0–15.0)
Total Lymphocyte: 29.8 %
WBC: 11.4 10*3/uL — ABNORMAL HIGH (ref 3.8–10.8)

## 2021-09-09 LAB — HEMOGLOBIN A1C
Hgb A1c MFr Bld: 7.2 % of total Hgb — ABNORMAL HIGH (ref <5.7)
Mean Plasma Glucose: 160 mg/dL
eAG (mmol/L): 8.9 mmol/L

## 2021-09-20 ENCOUNTER — Encounter: Payer: Self-pay | Admitting: Nurse Practitioner

## 2021-09-20 ENCOUNTER — Other Ambulatory Visit: Payer: Self-pay | Admitting: Nurse Practitioner

## 2021-09-20 DIAGNOSIS — E039 Hypothyroidism, unspecified: Secondary | ICD-10-CM

## 2021-09-20 MED ORDER — LEVOTHYROXINE SODIUM 50 MCG PO TABS: 50.0000 ug | ORAL_TABLET | Freq: Every day | ORAL | 2 refills | Status: DC

## 2021-09-27 ENCOUNTER — Other Ambulatory Visit: Payer: Self-pay | Admitting: Nurse Practitioner

## 2021-09-27 ENCOUNTER — Encounter: Payer: Self-pay | Admitting: Nurse Practitioner

## 2021-09-27 DIAGNOSIS — M5136 Other intervertebral disc degeneration, lumbar region: Secondary | ICD-10-CM

## 2021-09-27 MED ORDER — NAPROXEN 500 MG PO TABS: 500.0000 mg | ORAL_TABLET | ORAL | 1 refills | Status: DC | PRN

## 2021-09-29 ENCOUNTER — Encounter (HOSPITAL_BASED_OUTPATIENT_CLINIC_OR_DEPARTMENT_OTHER): Payer: Self-pay | Admitting: Family

## 2021-09-29 ENCOUNTER — Ambulatory Visit (HOSPITAL_BASED_OUTPATIENT_CLINIC_OR_DEPARTMENT_OTHER): Payer: 59 | Admitting: Family

## 2021-09-29 VITALS — BP 128/78 | HR 99 | Ht 70.0 in | Wt 189.0 lb

## 2021-09-29 DIAGNOSIS — E782 Mixed hyperlipidemia: Secondary | ICD-10-CM | POA: Diagnosis not present

## 2021-09-29 DIAGNOSIS — I491 Atrial premature depolarization: Secondary | ICD-10-CM | POA: Diagnosis not present

## 2021-09-29 DIAGNOSIS — I10 Essential (primary) hypertension: Secondary | ICD-10-CM

## 2021-09-29 DIAGNOSIS — I483 Typical atrial flutter: Secondary | ICD-10-CM | POA: Diagnosis not present

## 2021-09-29 DIAGNOSIS — E781 Pure hyperglyceridemia: Secondary | ICD-10-CM

## 2021-09-29 MED ORDER — ROSUVASTATIN CALCIUM 20 MG PO TABS: 20.0000 mg | ORAL_TABLET | Freq: Every day | ORAL | 5 refills | Status: DC

## 2021-09-29 NOTE — Progress Notes (Signed)
Office Visit    Patient Name: Paul Gates Date of Encounter: 09/30/2021  PCP:  [name], MD   [facility]  Cardiologist:  None  Advanced Practice Provider:  No care team member to display Electrophysiologist:  Thompson Grayer, MD      Chief Complaint    Paul Gates is a 61 y.o. male with a hx of atrial flutter s/p ablation 01/2021, NICM which resolved with restoration of NSR, hypertension, PACs, hypothyroidism, diabetes, HLD presents today for follow up of PACs   Past Medical History    Past Medical History:  Diagnosis Date   Allergy    Blood transfusion without reported diagnosis    Chronic kidney disease    kidney stones   COPD (chronic obstructive pulmonary disease) ([place])    DDD (degenerative disc disease)    Diabetes mellitus without complication ([place])    GERD (gastroesophageal reflux disease)    Heart murmur    1971- per pt no issues reported by cardiology about murmur- Echo done   History of kidney stones    Hyperlipidemia    Hypertension    Hypogonadism male    Hypothyroidism    Typical atrial flutter ([place]) 2022   Vitamin D deficiency    Past Surgical History:  Procedure Laterality Date   A-FLUTTER ABLATION N/A 02/01/2021   Procedure: A-FLUTTER ABLATION;  Surgeon: Thompson Grayer, MD;  Location: [facility];  Service: Cardiovascular;  Laterality: N/A;   ASD REPAIR  04/24/1969   open heart   CERVICAL FUSION  04/24/1989   COLONOSCOPY     ESOPHAGOGASTRODUODENOSCOPY  1987,1992,1993,1995   duodenal ulcer   LUMBAR DISC SURGERY     LUMBAR FUSION  04/25/1999   LUMBAR FUSION  02/2021   LUMBAR LAMINECTOMY  10/23/1995   UPPER GASTROINTESTINAL ENDOSCOPY      Allergies  No Known Allergies  History of Present Illness    Paul Gates is a 60 y.o. male with a hx of atrial flutter s/p ablation 01/2021, NICM which resolved with restoration of NSR, hypertension, PACs, hypothyroidism, diabetes, HLD last  seen 09/01/21 by Dr. Rayann Gates.  When last seen 09/01/21 by Dr. Rayann Gates he had been started on HCTZ by [facility] provider. He noted some hypotensive episodes and PACs which were symptomatic and noted on EKG. His HCTZ was reduced to 12.'5mg'$  daily. He was encouraged sodium restriction and tobacco cessation.   He presents today for follow up. Notes that since earlier this week his PACs stopped. Monitoring with [facility]. He previously worked as an Public relations account executive and we reviewed where to find p-wave on the EKG. He is very active at his job working outside most of the day at [employer]. Discussed staying well hydrated. Notes he had lumbar fusion 6 months ago and is hopeful at his upcoming surgical appointment he will be released for further exercise so he can bike. BP has been well controlled at home. [facility] but prefers to have it managed more locally.   09/08/21 labs via external provider: total cholesterol 125, HDL 29, triglycerides 337, LDL 65   EKGs/Labs/Other Studies Reviewed:   The following studies were reviewed today:  EKG:  No EKG today.   Recent Labs: 09/01/2021: Magnesium 2.3 09/08/2021: ALT 40; BUN 22; Creat 0.93; Hemoglobin 15.5; Platelets 236; Potassium 4.2; Sodium 136; TSH 2.11  Recent Lipid Panel    Component Value  Date/Time   CHOL 135 09/08/2021 1617   TRIG 337 (H) 09/08/2021 1617   HDL 29 (L) 09/08/2021 1617   CHOLHDL 4.7 09/08/2021 1617   VLDL 48 (H) 08/29/2016 1617   LDLCALC 65 09/08/2021 1617   Home Medications   Current Meds  Medication Sig   acetaminophen (TYLENOL) 500 MG tablet Take 1,000 mg by mouth every 6 (six) hours as needed for moderate pain.   cetirizine (ZYRTEC) 10 MG tablet Take 10 mg by mouth daily.   Cholecalciferol (VITAMIN D-3) 5000 UNITS TABS Take 5,000 Units by mouth daily.   cyclobenzaprine (FLEXERIL) 10 MG tablet Take 10 mg by mouth daily as needed for muscle spasms.   dextroamphetamine (DEXEDRINE  SPANSULE) 15 MG 24 hr capsule Take 15 mg by mouth daily as needed (long/busy days).   hydrochlorothiazide (MICROZIDE) 12.5 MG capsule Take 1 capsule (12.5 mg total) by mouth daily.   levothyroxine (SYNTHROID) 50 MCG tablet Take 1 tablet (50 mcg total) by mouth daily before breakfast.   losartan (COZAAR) 100 MG tablet Take 100 mg by mouth daily.   metFORMIN (GLUCOPHAGE-XR) 750 MG 24 hr tablet Take 1,500 mg by mouth daily.   naproxen (NAPROSYN) 500 MG tablet Take 1 tablet (500 mg total) by mouth as needed.   omeprazole (PRILOSEC OTC) 20 MG tablet Take 20 mg by mouth daily.   ONE TOUCH ULTRA TEST test strip TEST BLOOD SUGAR 3 TIMES A DAY   ONETOUCH DELICA LANCETS 67Y MISC USE TO CHECK SUGAR DAILY   rosuvastatin (CRESTOR) 20 MG tablet Take 1 tablet (20 mg total) by mouth daily.   tamsulosin (FLOMAX) 0.4 MG CAPS capsule Take 0.4 mg by mouth daily.   traMADol (ULTRAM) 50 MG tablet Take 50 mg by mouth every 6 (six) hours as needed for pain.   vitamin C (ASCORBIC ACID) 500 MG tablet Take 1,000 mg by mouth daily.   zinc gluconate 50 MG tablet Take 50 mg by mouth daily.     Review of Systems      All other systems reviewed and are otherwise negative except as noted above.  Physical Exam    VS:  BP 128/78 (BP Location: Left Arm, Patient Position: Sitting, Cuff Size: Normal)   Pulse 99   Ht '5\' 10"'$  (1.778 m)   Wt 189 lb (85.7 kg)   BMI 27.12 kg/m  , BMI Body mass index is 27.12 kg/m.  Wt Readings from Last 3 Encounters:  09/29/21 189 lb (85.7 kg)  09/08/21 184 lb (83.5 kg)  09/01/21 182 lb 9.6 oz (82.8 kg)     GEN: Well nourished, well developed, in no acute distress. HEENT: normal. Neck: Supple, no JVD, carotid bruits, or masses. Cardiac: RRR, no murmurs, rubs, or gallops. No clubbing, cyanosis, edema.  Radials/PT 2+ and equal bilaterally.  Respiratory:  Respirations regular and unlabored, clear to auscultation bilaterally. GI: Soft, nontender, nondistended. MS: No deformity or  atrophy. Skin: Warm and dry, no rash. Neuro:  Strength and sensation are intact. Psych: Normal affect.  Assessment & Plan    Atrial flutter - s/p ablation. No recurrence. Follows with Dr. Rayann Gates of EP. No evidence of recurrence.   HTN - BP well controlled. Continue current antihypertensive regimen of Losartan '100mg'$  QD and HCTZ 12.'5mg'$  QD. Discussed to monitor BP at home at least 2 hours after medications and sitting for 5-10 minutes. Check in via MyChart 2 weeks to reassess BP. He understandably would like to simplify antihypertensive regimen. If BP well controlled, consider combination Losartan-HCTZ  or if able trial on Losartan alone. Would avoid escalation of HCTZ as previously increased PACs.  PAC - 08/2021 TSH 2.11. Electrolytes unremarkable. Notes they have been quiescent since earlier this week. Continue to stay well hydrated, avoid caffeine, reduce tobacco. No indication for AV nodal blocking therapy at this time.   Hypothyroidism - Continue to follow with PCP. 08/2021 TSH 2.11  DM2 / Hypertriglyceridemia - Continue to follow with PCP.  Discussed indication for statin in diabetes and elevated triglycerides. Start Rosuvastatin '20mg'$  daily with repeat FLP/LFT in 2 months.    Disposition: Follow up in 4 month(s) with Dr. Rayann Gates or Loel Dubonnet, NP   Signed, Loel Dubonnet, NP 09/30/2021, 8:16 PM [place]

## 2021-09-29 NOTE — Patient Instructions (Addendum)
Medication Instructions:  Your physician has recommended you make the following change in your medication:   START Rosuvastatin '20mg'$  daily   *If you need a refill on your cardiac medications before your next appointment, please call your pharmacy*   Lab Work: Your physician recommends that you return for lab work in 2 months for fasting lipid panel and liver enzymes.   Please return for Lab work. You may come to the...   [facility] (3rd floor) [address], [place], [place] [address]  Open: 8am-Noon and 1pm-4:30pm  Please ring the doorbell on the small table when you exit the elevator and the Lab Tech will come get you  [facility] [address] [place], [place], [place] [address] Open: 8am-1pm, then 2pm-4:30pm   [place]- Please see attached locations sheet stapled to your lab work with address and hours.    If you have labs (blood work) drawn today and your tests are completely normal, you will receive your results only by: [facility] (if you have MyChart) OR A paper copy in the mail If you have any lab test that is abnormal or we need to change your treatment, we will call you to review the results.   Testing/Procedures: None ordered today.   Follow-Up: At [facility], you and your health needs are our priority.  As part of our continuing mission to provide you with exceptional heart care, we have created designated Provider Care Teams.  These Care Teams include your primary Cardiologist (physician) and Advanced Practice Providers (APPs -  Physician Assistants and Nurse Practitioners) who all work together to provide you with the care you need, when you need it.  We recommend signing up for the patient portal called "MyChart".  Sign up information is provided on this After Visit Summary.  MyChart is used to connect with patients for Virtual Visits (Telemedicine).  Patients are able to view lab/test results,  encounter notes, upcoming appointments, etc.  Non-urgent messages can be sent to your provider as well.   To learn more about what you can do with MyChart, go to [email or web address].    Your next appointment:   4 month(s)  The format for your next appointment:   In Person  Provider:   Thompson Grayer, MD or Loel Dubonnet, NP     Other Instructions  To prevent palpitations: Make sure you are adequately hydrated.  Avoid and/or limit caffeine containing beverages like soda or tea. Exercise regularly.  Manage stress well. Some over the counter medications can cause palpitations such as Benadryl, AdvilPM, TylenolPM. Regular Advil or Tylenol do not cause palpitations.    Heart Healthy Diet Recommendations: A low-salt diet is recommended. Meats should be grilled, baked, or boiled. Avoid fried foods. Focus on lean protein sources like fish or chicken with vegetables and fruits. The American Heart Association is a Microbiologist!  American Heart Association Diet and Lifeystyle Recommendations   Exercise recommendations: The American Heart Association recommends 150 minutes of moderate intensity exercise weekly. Try 30 minutes of moderate intensity exercise 4-5 times per week. This could include walking, jogging, or swimming.  Tips to Measure your Blood Pressure Correctly  Here's what you can do to ensure a correct reading:  Don't drink a caffeinated beverage or smoke during the 30 minutes before the test.  Sit quietly for five minutes before the test begins.  During the measurement, sit in a chair with your feet on the floor and your arm supported  so your elbow is at about heart level.  The inflatable part of the cuff should completely cover at least 80% of your upper arm, and the cuff should be placed on bare skin, not over a shirt.  Don't talk during the measurement.  Blood pressure categories  Blood pressure category SYSTOLIC (upper number)  DIASTOLIC (lower number)  Normal  Less than 120 mm Hg and Less than 80 mm Hg  Elevated 120-129 mm Hg and Less than 80 mm Hg  High blood pressure: Stage 1 hypertension 130-139 mm Hg or 80-89 mm Hg  High blood pressure: Stage 2 hypertension 140 mm Hg or higher or 90 mm Hg or higher  Hypertensive crisis (consult your doctor immediately) Higher than 180 mm Hg and/or Higher than 120 mm Hg  Source: American Heart Association and American Stroke Association. For more on getting your blood pressure under control, buy Controlling Your Blood Pressure, a Special Health Report from [facility].   Blood Pressure Log   Date   Time  Blood Pressure  Example: Nov 1 9 AM 124/78

## 2021-10-02 ENCOUNTER — Encounter (HOSPITAL_BASED_OUTPATIENT_CLINIC_OR_DEPARTMENT_OTHER): Payer: Self-pay

## 2021-10-13 ENCOUNTER — Encounter: Payer: Self-pay | Admitting: Nurse Practitioner

## 2021-10-13 ENCOUNTER — Encounter (HOSPITAL_BASED_OUTPATIENT_CLINIC_OR_DEPARTMENT_OTHER): Payer: Self-pay

## 2021-10-13 ENCOUNTER — Other Ambulatory Visit: Payer: Self-pay | Admitting: Nurse Practitioner

## 2021-10-13 DIAGNOSIS — R3912 Poor urinary stream: Secondary | ICD-10-CM

## 2021-10-13 MED ORDER — TAMSULOSIN HCL 0.4 MG PO CAPS: 0.4000 mg | ORAL_CAPSULE | Freq: Every day | ORAL | 3 refills | Status: DC

## 2021-10-31 ENCOUNTER — Encounter (HOSPITAL_BASED_OUTPATIENT_CLINIC_OR_DEPARTMENT_OTHER): Payer: Self-pay

## 2021-10-31 ENCOUNTER — Other Ambulatory Visit: Payer: Self-pay | Admitting: Nurse Practitioner

## 2021-10-31 ENCOUNTER — Encounter: Payer: Self-pay | Admitting: Nurse Practitioner

## 2021-10-31 DIAGNOSIS — I10 Essential (primary) hypertension: Secondary | ICD-10-CM

## 2021-10-31 MED ORDER — LOSARTAN POTASSIUM 100 MG PO TABS: 100.0000 mg | ORAL_TABLET | Freq: Every day | ORAL | 3 refills | Status: DC

## 2021-11-04 ENCOUNTER — Encounter: Payer: Self-pay | Admitting: Internal Medicine

## 2021-11-15 ENCOUNTER — Other Ambulatory Visit: Payer: Self-pay | Admitting: Nurse Practitioner

## 2021-11-15 ENCOUNTER — Encounter: Payer: Self-pay | Admitting: Nurse Practitioner

## 2021-11-15 DIAGNOSIS — R3912 Poor urinary stream: Secondary | ICD-10-CM

## 2021-11-15 MED ORDER — TAMSULOSIN HCL 0.4 MG PO CAPS: 0.4000 mg | ORAL_CAPSULE | Freq: Every day | ORAL | 3 refills | Status: DC

## 2021-11-26 ENCOUNTER — Encounter (HOSPITAL_BASED_OUTPATIENT_CLINIC_OR_DEPARTMENT_OTHER): Payer: Self-pay

## 2021-11-26 DIAGNOSIS — I491 Atrial premature depolarization: Secondary | ICD-10-CM

## 2021-11-28 ENCOUNTER — Encounter: Payer: Self-pay | Admitting: Nurse Practitioner

## 2021-11-28 MED ORDER — METOPROLOL SUCCINATE ER 25 MG PO TB24: 25.0000 mg | ORAL_TABLET | Freq: Every day | ORAL | 2 refills | Status: DC

## 2021-11-28 NOTE — Addendum Note (Signed)
Addended by: Loel Dubonnet on: 11/28/2021 08:17 AM   Modules accepted: Orders

## 2021-11-29 ENCOUNTER — Other Ambulatory Visit: Payer: Self-pay

## 2021-11-29 DIAGNOSIS — E1122 Type 2 diabetes mellitus with diabetic chronic kidney disease: Secondary | ICD-10-CM

## 2021-11-29 MED ORDER — METFORMIN HCL ER 750 MG PO TB24: 1500.0000 mg | ORAL_TABLET | Freq: Every day | ORAL | 3 refills | Status: DC

## 2021-11-29 NOTE — Telephone Encounter (Signed)
Pt called saying he sent a mychart message requesting refill on Metformin

## 2021-12-04 ENCOUNTER — Encounter: Payer: Self-pay | Admitting: Nurse Practitioner

## 2021-12-04 ENCOUNTER — Other Ambulatory Visit: Payer: Self-pay | Admitting: Internal Medicine

## 2021-12-04 DIAGNOSIS — I10 Essential (primary) hypertension: Secondary | ICD-10-CM

## 2021-12-04 MED ORDER — LOSARTAN POTASSIUM 100 MG PO TABS: ORAL_TABLET | ORAL | 3 refills | Status: DC

## 2021-12-06 ENCOUNTER — Other Ambulatory Visit (HOSPITAL_BASED_OUTPATIENT_CLINIC_OR_DEPARTMENT_OTHER): Payer: Self-pay

## 2021-12-06 DIAGNOSIS — E781 Pure hyperglyceridemia: Secondary | ICD-10-CM

## 2021-12-06 DIAGNOSIS — E782 Mixed hyperlipidemia: Secondary | ICD-10-CM

## 2021-12-06 LAB — LIPID PANEL
Chol/HDL Ratio: 2.3 ratio (ref 0.0–5.0)
Cholesterol, Total: 75 mg/dL — ABNORMAL LOW (ref 100–199)
HDL: 32 mg/dL — ABNORMAL LOW (ref >39)
LDL Chol Calc (NIH): 25 mg/dL (ref 0–99)
Triglycerides: 88 mg/dL (ref 0–149)
VLDL Cholesterol Cal: 18 mg/dL (ref 5–40)

## 2021-12-06 LAB — HEPATIC FUNCTION PANEL
ALT: 81 IU/L — ABNORMAL HIGH (ref 0–44)
AST: 39 IU/L (ref 0–40)
Albumin: 5 g/dL — ABNORMAL HIGH (ref 3.8–4.9)
Alkaline Phosphatase: 89 IU/L (ref 44–121)
Bilirubin Total: <0.2 mg/dL (ref 0.0–1.2)
Bilirubin, Direct: <0.1 mg/dL (ref 0.00–0.40)
Total Protein: 6.9 g/dL (ref 6.0–8.5)

## 2021-12-06 MED ORDER — ROSUVASTATIN CALCIUM 10 MG PO TABS
10.0000 mg | ORAL_TABLET | Freq: Every day | ORAL | 3 refills | Status: DC
Start: 2021-12-06 — End: 2022-12-18

## 2021-12-11 ENCOUNTER — Encounter (HOSPITAL_BASED_OUTPATIENT_CLINIC_OR_DEPARTMENT_OTHER): Payer: Self-pay

## 2021-12-12 ENCOUNTER — Ambulatory Visit: Payer: 59 | Admitting: Internal Medicine

## 2021-12-12 ENCOUNTER — Encounter: Payer: Self-pay | Admitting: Internal Medicine

## 2021-12-12 VITALS — BP 120/60 | HR 66 | Temp 97.8°F | Resp 17 | Ht 70.0 in | Wt 184.0 lb

## 2021-12-12 DIAGNOSIS — E559 Vitamin D deficiency, unspecified: Secondary | ICD-10-CM | POA: Diagnosis not present

## 2021-12-12 DIAGNOSIS — I10 Essential (primary) hypertension: Secondary | ICD-10-CM | POA: Diagnosis not present

## 2021-12-12 DIAGNOSIS — N182 Chronic kidney disease, stage 2 (mild): Secondary | ICD-10-CM

## 2021-12-12 DIAGNOSIS — E785 Hyperlipidemia, unspecified: Secondary | ICD-10-CM

## 2021-12-12 DIAGNOSIS — E1169 Type 2 diabetes mellitus with other specified complication: Secondary | ICD-10-CM | POA: Diagnosis not present

## 2021-12-12 DIAGNOSIS — Z79899 Other long term (current) drug therapy: Secondary | ICD-10-CM

## 2021-12-12 DIAGNOSIS — E1122 Type 2 diabetes mellitus with diabetic chronic kidney disease: Secondary | ICD-10-CM | POA: Diagnosis not present

## 2021-12-12 DIAGNOSIS — E039 Hypothyroidism, unspecified: Secondary | ICD-10-CM

## 2021-12-12 NOTE — Patient Instructions (Signed)

## 2021-12-12 NOTE — Progress Notes (Signed)
Future Appointments  Date Time Provider Department  12/12/2021               6 mo ov  4:00 PM [name], MD [facility]  02/01/2022  3:10 PM Loel Dubonnet, NP [facility]  06/12/2022               cpe  3:00 PM [name], MD [facility]    History of Present Illness:       This very nice 61 y.o. single WM presents for 6 month follow up with HTN, HLD, T2_NIDDM , Hypothyroidism, Testosterone Deficiency and Vitamin D Deficiency.        Patient is treated for HTN (2014)  & BP has been controlled at home. Today's BP is at goal -  126/80.  In Oct 2022, patient underwent an ablation by Dr Rayann Heman for [facility] . Patient has had no complaints of any cardiac type chest pain, palpitations, dyspnea / orthopnea / PND, dizziness, claudication, or dependent edema.                                                     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  In Nov 2022, patient had removal of hardware from previous fusions with interbody fusion L2-5.        Hyperlipidemia is controlled with diet & meds. Patient denies myalgias or other med SE's. Last Lipids were at goal :  Lab Results  Component Value Date   CHOL 75 (L) 12/02/2021   HDL 32 (L) 12/02/2021   LDLCALC 25 12/02/2021   TRIG 88 12/02/2021   CHOLHDL 2.3 12/02/2021     Also, the patient has history of T2_NIDDM  (2013) and has had no symptoms of reactive hypoglycemia, diabetic polys, paresthesias or visual blurring.  Last A1c was not at goal :  Lab Results  Component Value Date   HGBA1C 7.2 (H) 09/08/2021                                                    Patient was dx'd Hypothyroid in 2015 and has been on Thyroid Replacement since then.                                                    Patient also has Testosterone Deficiency & is on Depo  Testosterone with improved stamina & sense of well being.                                                       Further, the patient also  has history of Vitamin D Deficiency  ("33" /2013)  and supplements vitamin D without any suspected side-effects. Last vitamin D was at goal :  Lab Results  Component Value Date   VD25OH 73 06/06/2021  Current Outpatient Medications on File Prior to Visit  Medication Sig   aspirin EC 81 MG tablet Take daily.   VITAMIN D5000 UNITS TABS Take  daily.   Cyclobenzaprine 10 MG tablet Take 5 mg 3 times daily as needed for muscle spasms.   DEXEDRINE SPANSULE 15 MG  Take 15 mg daily as needed (attention).   ibuprofen 200 MG tablet Take 800 as needed for moderate pain.   levothyroxine 50 MCG tablet TAKE 1 TABLET DAILY    losartan 100 MG tablet Take  daily.   metFORMIN-XR 500 MG 2 Take 1 x times daily with meals.   NICODERM CQ  21 mg/24hr patch Place 21 mg onto the skin dai   Omega-3 Fatty Acids  1200 MG CPDR Take 2,400 mg daily.   tamsulosin 0.4 MG CAPS capsule Take 0.4 mg daily.   testosterone cypio 200 MG/ML injection USE 2ML IM EVERY 2 WEEKS    traMADol 50 MG tablet Take 50 mg every 6 hours as needed for pain.   TURMERIC 2,000 mg Take  daily.   vitamin C 500 MG tablet Take 2,000 mg daily as needed (immune support).   Zinc 50 MG TABS Take daily.    No Known Allergies   PMHx:   Past Medical History:  Diagnosis Date   COPD (chronic obstructive pulmonary disease) ([place])    DDD (degenerative disc disease)    Depression 10/09/2013   Diabetes mellitus without complication (HCC)    GERD (gastroesophageal reflux disease)    Heart murmur    1971   History of kidney stones    Hyperlipidemia    Hypertension    Hypogonadism male    Hypothyroidism    Vitamin D deficiency      Immunization History  Administered Date(s) Administered   DTaP 04/04/2001, 04/24/2008   Influenza Inj Mdck Quad  02/21/2017   Influenza Whole 01/09/2013   Influenza,inj,quad 02/17/2016   PFIZER-SARS-COV-2 Vacc 07/04/2019, 07/25/2019, 03/11/2020   PPD Test 04/02/2018, 05/07/2019, 05/26/2020   Pneumococcal  -23 02/21/2017   Tdap 07/17/2013     Past Surgical History:  Procedure Date   ASD REPAIR 1971   open heart   CERVICAL FUSION 1991   ESOPHAGOGASTRODUODENOSCOPY 1987,1992,1993,1995   duodenal ulcer   LUMBAR [place] SURGERY    LUMBAR FUSION 2001   LUMBAR LAMINECTOMY 7/97    FHx:    Reviewed / unchanged  SHx:    Reviewed / unchanged   Systems Review:  Constitutional: Denies fever, chills, wt changes, headaches, insomnia, fatigue, night sweats, change in appetite. Eyes: Denies redness, blurred vision, diplopia, discharge, itchy, watery eyes.  ENT: Denies discharge, congestion, post nasal drip, epistaxis, sore throat, earache, hearing loss, dental pain, tinnitus, vertigo, sinus pain, snoring.  CV: Denies chest pain, palpitations, irregular heartbeat, syncope, dyspnea, diaphoresis, orthopnea, PND, claudication or edema. Respiratory: denies cough, dyspnea, DOE, pleurisy, hoarseness, laryngitis, wheezing.  Gastrointestinal: Denies dysphagia, odynophagia, heartburn, reflux, water brash, abdominal pain or cramps, nausea, vomiting, bloating, diarrhea, constipation, hematemesis, melena, hematochezia  or hemorrhoids. Genitourinary: Denies dysuria, frequency, urgency, nocturia, hesitancy, discharge, hematuria or flank pain. Musculoskeletal: Denies arthralgias, myalgias, stiffness, jt. swelling, pain, limping or strain/sprain.  Skin: Denies pruritus, rash, hives, warts, acne, eczema or change in skin lesion(s). Neuro: No weakness, tremor, incoordination, spasms, paresthesia or pain. Psychiatric: Denies confusion, memory loss or sensory loss. Endo: Denies change in weight, skin or hair change.  Heme/Lymph: No excessive bleeding, bruising or enlarged lymph nodes.  Physical Exam  There were no vitals taken for  this visit.  Appears  well nourished, well groomed  and in no distress.  Eyes: PERRLA, EOMs, conjunctiva no swelling or erythema. Sinuses: No frontal/maxillary tenderness ENT/Mouth:  EAC's clear, TM's nl w/o erythema, bulging. Nares clear w/o erythema, swelling, exudates. Oropharynx clear without erythema or exudates. Oral hygiene is good. Tongue normal, non obstructing. Hearing intact.  Neck: Supple. Thyroid not palpable. Car 2+/2+ without bruits, nodes or JVD. Chest: Respirations nl with BS clear & equal w/o rales, rhonchi, wheezing or stridor.  Cor: Heart sounds normal w/ regular rate and rhythm without sig. murmurs, gallops, clicks or rubs. Peripheral pulses normal and equal  without edema.  Abdomen: Soft & bowel sounds normal. Non-tender w/o guarding, rebound, hernias, masses or organomegaly.  Lymphatics: Unremarkable.  Musculoskeletal: Full ROM all peripheral extremities, joint stability, 5/5 strength and normal gait.  Skin: Warm, dry without exposed rashes, lesions or ecchymosis apparent.  Neuro: Cranial nerves intact, reflexes equal bilaterally. Sensory-motor testing grossly intact. Tendon reflexes grossly intact.  Pysch: Alert & oriented x 3.  Insight and judgement nl & appropriate. No ideations.  Assessment and Plan:  1. Essential hypertension  - Continue medication, monitor blood pressure at home.  - Continue DASH diet.  Reminder to go to the ER if any CP,  SOB, nausea, dizziness, severe HA, changes vision/speech.    - CBC with Differential/Platelet - COMPLETE METABOLIC PANEL WITH GFR - Magnesium  2. Hyperlipidemia associated with type 2 diabetes mellitus ([place])  - Continue diet/meds, exercise,& lifestyle modifications.  - Continue monitor periodic cholesterol/liver & renal functions     - Lipid panel - TSH  3. Type 2 diabetes mellitus with stage 2 chronic kidney  disease, without long-term current use of insulin (HCC)  - Continue diet, exercise  - Lifestyle modifications.  - Monitor appropriate labs   - Hemoglobin A1c - Insulin, random  4. Vitamin D deficiency  - Continue supplementation  - VITAMIN D 25 Hydroxy  5. Hypothyroidism  -  TSH  6. Medication management  - CBC with Differential/Platelet - COMPLETE METABOLIC PANEL WITH GFR - Magnesium - Lipid panel - TSH - Hemoglobin A1c - Insulin, random - VITAMIN D 25 Hydroxy                                                                                                       6. Medication management  - CBC with Differential/Platelet - COMPLETE METABOLIC PANEL WITH GFR - Magnesium - Lipid panel - TSH - Hemoglobin A1c - Insulin, random - VITAMIN D 25 Hydroxy         Discussed  regular exercise, BP monitoring, weight control to achieve/maintain BMI less than 25 and discussed med and SE's. Recommended labs to assess and monitor clinical status with further disposition pending results of labs.  I discussed the assessment and treatment plan with the patient. The patient was provided an opportunity to ask questions and all were answered. The patient agreed with the plan and demonstrated an understanding of the instructions.  I provided over 30 minutes of exam, counseling, chart review and  complex critical  decision making.        The patient was advised to call back or seek an in-person evaluation if the symptoms worsen or if the condition fails to improve as anticipated.   Kirtland Bouchard, MD

## 2021-12-13 LAB — LIPID PANEL
Cholesterol: 105 mg/dL (ref <200)
HDL: 30 mg/dL — ABNORMAL LOW (ref >=40)
LDL Cholesterol (Calc): 43 mg/dL (calc)
Non-HDL Cholesterol (Calc): 75 mg/dL (calc) (ref <130)
Total CHOL/HDL Ratio: 3.5 (calc) (ref <5.0)
Triglycerides: 303 mg/dL — ABNORMAL HIGH (ref <150)

## 2021-12-13 LAB — CBC WITH DIFFERENTIAL/PLATELET
Absolute Monocytes: 708 cells/uL (ref 200–950)
Basophils Absolute: 47 cells/uL (ref 0–200)
Basophils Relative: 0.4 %
Eosinophils Absolute: 94 cells/uL (ref 15–500)
Eosinophils Relative: 0.8 %
HCT: 43 % (ref 38.5–50.0)
Hemoglobin: 14.5 g/dL (ref 13.2–17.1)
Lymphs Abs: 3233 cells/uL (ref 850–3900)
MCH: 30.7 pg (ref 27.0–33.0)
MCHC: 33.7 g/dL (ref 32.0–36.0)
MCV: 91.1 fL (ref 80.0–100.0)
MPV: 11.3 fL (ref 7.5–12.5)
Monocytes Relative: 6 %
Neutro Abs: 7717 cells/uL (ref 1500–7800)
Neutrophils Relative %: 65.4 %
Platelets: 208 10*3/uL (ref 140–400)
RBC: 4.72 10*6/uL (ref 4.20–5.80)
RDW: 13.2 % (ref 11.0–15.0)
Total Lymphocyte: 27.4 %
WBC: 11.8 10*3/uL — ABNORMAL HIGH (ref 3.8–10.8)

## 2021-12-13 LAB — HEMOGLOBIN A1C
Hgb A1c MFr Bld: 6.5 % of total Hgb — ABNORMAL HIGH (ref <5.7)
Mean Plasma Glucose: 140 mg/dL
eAG (mmol/L): 7.7 mmol/L

## 2021-12-13 LAB — COMPLETE METABOLIC PANEL WITH GFR
AG Ratio: 2.3 (calc) (ref 1.0–2.5)
ALT: 73 U/L — ABNORMAL HIGH (ref 9–46)
AST: 33 U/L (ref 10–35)
Albumin: 4.8 g/dL (ref 3.6–5.1)
Alkaline phosphatase (APISO): 72 U/L (ref 35–144)
BUN: 19 mg/dL (ref 7–25)
CO2: 23 mmol/L (ref 20–32)
Calcium: 10 mg/dL (ref 8.6–10.3)
Chloride: 105 mmol/L (ref 98–110)
Creat: 1.1 mg/dL (ref 0.70–1.30)
Globulin: 2.1 g/dL (calc) (ref 1.9–3.7)
Glucose, Bld: 139 mg/dL — ABNORMAL HIGH (ref 65–99)
Potassium: 4.3 mmol/L (ref 3.5–5.3)
Sodium: 139 mmol/L (ref 135–146)
Total Bilirubin: 0.2 mg/dL (ref 0.2–1.2)
Total Protein: 6.9 g/dL (ref 6.1–8.1)
eGFR: 78 mL/min/{1.73_m2} (ref >=60)

## 2021-12-13 LAB — VITAMIN D 25 HYDROXY (VIT D DEFICIENCY, FRACTURES): Vit D, 25-Hydroxy: 66 ng/mL (ref 30–100)

## 2021-12-13 LAB — INSULIN, RANDOM: Insulin: 60.9 u[IU]/mL — ABNORMAL HIGH

## 2021-12-13 LAB — TSH: TSH: 2.28 mIU/L (ref 0.40–4.50)

## 2021-12-13 LAB — MAGNESIUM: Magnesium: 2 mg/dL (ref 1.5–2.5)

## 2021-12-13 NOTE — Progress Notes (Signed)
<><><><><><><><><><><><><><><><><><><><><><><><><><><><><><><><><> <><><><><><><><><><><><><><><><><><><><><><><><><><><><><><><><><> -   Test results slightly outside the reference range are not unusual. If there is anything important, I will review this with you,  otherwise it is considered normal test values.  If you have further questions,  please do not hesitate to contact me at the office or via My Chart.  <><><><><><><><><><><><><><><><><><><><><><><><><><><><><><><><><> <><><><><><><><><><><><><><><><><><><><><><><><><><><><><><><><><>  -  Total Chol = 103   & LDL Chol = 43 - Both  - Wonderful   - Very low risk for Heart Attack  / Stroke Iiiiiiiiiiiiiiiiiiiiiiiiiiiiiiiiiiiiiiiiiiiiiiiiiiiiiiiiiiiiiiiiiiiiiiiiiiiiiiiiiiiiiiiiiiiiiiiiiiiiiiiiiiiiiiiiiiiiiiiiiiiiiiiiiiiiiiiiiiiiiiiiiiiiiiiiiiiiiiiiiiiiiiiiiiii  - but Triglycerides (  303 ) or fats in blood are too high                 (   Ideal or  Goal is less than 150  !  )   HFWYOVZCHYIFOYDXAJOINOMVEHMCNOBSJGGEZMOQHUTMLYYTKPTWSFKCLEXNTZGYFVCBSWHQPRFFMBWGYKZLDJTTSVXBLTJQZESPQZRAQTMAUQJFHLKTGYBWLSLHTDSKAJGOTLXBWIOMBTDHRCBULAGTXMIWOEHOZYYQMGNOIBBC  - Recommend avoid fried & greasy foods,  sweets / candy,   - Avoid white rice  (brown or wild rice or Quinoa is OK),   - Avoid white potatoes  (sweet potatoes are OK)   - Avoid anything made from white flour  - bagels, doughnuts, rolls, buns, biscuits, white and   wheat breads, pizza crust and traditional  pasta made of white flour & egg white  - (vegetarian pasta or spinach or wheat pasta is OK).    - Multi-grain bread is OK - like multi-grain flat bread or  sandwich thins.   - Avoid alcohol in excess.   - Exercise is also important. <><><><><><><><><><><><><><><><><><><><><><><><><><><><><><><><><>  -  A1c is better - down from 7.2% to Now 6.5% - but still too high                        (  Ideal or  Goal is less than 5.8 %  !  )   <><><><><><><><><><><><><><><><><><><><><><><><><><><><><><><><><> <><><><><><><><><><><><><><><><><><><><><><><><><><><><><><><><><>  -  Vitamin D = 66 - Excellent - Please keep dose Same  ! <><><><><><><><><><><><><><><><><><><><><><><><><><><><><><><><><>  -  All Else - CBC - Kidneys - Electrolytes - Liver - Magnesium & Thyroid    - all  Normal / OK <><><><><><><><><><><><><><><><><><><><><><><><><><><><><><><><><> <><><><><><><><><><><><><><><><><><><><><><><><><><><><><><><><><>

## 2021-12-14 ENCOUNTER — Ambulatory Visit (INDEPENDENT_AMBULATORY_CARE_PROVIDER_SITE_OTHER): Payer: 59

## 2021-12-14 DIAGNOSIS — Z23 Encounter for immunization: Secondary | ICD-10-CM

## 2021-12-19 ENCOUNTER — Other Ambulatory Visit: Payer: Self-pay | Admitting: Nurse Practitioner

## 2021-12-19 ENCOUNTER — Encounter: Payer: Self-pay | Admitting: Nurse Practitioner

## 2021-12-19 DIAGNOSIS — R3912 Poor urinary stream: Secondary | ICD-10-CM

## 2021-12-19 MED ORDER — TAMSULOSIN HCL 0.4 MG PO CAPS: 0.4000 mg | ORAL_CAPSULE | Freq: Every day | ORAL | 3 refills | Status: DC

## 2022-01-04 ENCOUNTER — Other Ambulatory Visit: Payer: Self-pay | Admitting: Internal Medicine

## 2022-01-04 ENCOUNTER — Encounter: Payer: Self-pay | Admitting: Internal Medicine

## 2022-01-04 DIAGNOSIS — G5602 Carpal tunnel syndrome, left upper limb: Secondary | ICD-10-CM

## 2022-01-17 ENCOUNTER — Telehealth (HOSPITAL_BASED_OUTPATIENT_CLINIC_OR_DEPARTMENT_OTHER): Payer: Self-pay

## 2022-01-17 LAB — HEPATIC FUNCTION PANEL
ALT: 62 IU/L — ABNORMAL HIGH (ref 0–44)
AST: 34 IU/L (ref 0–40)
Albumin: 4.7 g/dL (ref 3.8–4.9)
Alkaline Phosphatase: 89 IU/L (ref 44–121)
Bilirubin Total: 0.5 mg/dL (ref 0.0–1.2)
Bilirubin, Direct: 0.14 mg/dL (ref 0.00–0.40)
Total Protein: 6.8 g/dL (ref 6.0–8.5)

## 2022-01-17 LAB — LIPID PANEL
Chol/HDL Ratio: 2.9 ratio (ref 0.0–5.0)
Cholesterol, Total: 84 mg/dL — ABNORMAL LOW (ref 100–199)
HDL: 29 mg/dL — ABNORMAL LOW (ref >39)
LDL Chol Calc (NIH): 28 mg/dL (ref 0–99)
Triglycerides: 159 mg/dL — ABNORMAL HIGH (ref 0–149)
VLDL Cholesterol Cal: 27 mg/dL (ref 5–40)

## 2022-01-17 NOTE — Telephone Encounter (Addendum)
Results called to patient who verbalizes understanding!    ----- Message from Loel Dubonnet, NP sent at 01/17/2022  7:52 AM EDT ----- Stable liver enzymes. LDL (bad cholesterol) at goal. Triglycerides much improved! They have reduced by nearly 50%! Previously 303 now 159 (goal is less than 150). Continue same medications and keep up the great work with diet and exercise.

## 2022-02-01 ENCOUNTER — Encounter (HOSPITAL_BASED_OUTPATIENT_CLINIC_OR_DEPARTMENT_OTHER): Payer: Self-pay | Admitting: Family

## 2022-02-01 ENCOUNTER — Ambulatory Visit (INDEPENDENT_AMBULATORY_CARE_PROVIDER_SITE_OTHER): Payer: 59 | Admitting: Family

## 2022-02-01 VITALS — BP 123/79 | HR 61 | Ht 70.0 in | Wt 190.4 lb

## 2022-02-01 DIAGNOSIS — Z720 Tobacco use: Secondary | ICD-10-CM

## 2022-02-01 DIAGNOSIS — I491 Atrial premature depolarization: Secondary | ICD-10-CM | POA: Diagnosis not present

## 2022-02-01 DIAGNOSIS — F17200 Nicotine dependence, unspecified, uncomplicated: Secondary | ICD-10-CM

## 2022-02-01 DIAGNOSIS — I10 Essential (primary) hypertension: Secondary | ICD-10-CM

## 2022-02-01 DIAGNOSIS — R002 Palpitations: Secondary | ICD-10-CM | POA: Diagnosis not present

## 2022-02-01 MED ORDER — METOPROLOL SUCCINATE ER 25 MG PO TB24: 25.0000 mg | ORAL_TABLET | Freq: Every day | ORAL | 3 refills | Status: DC

## 2022-02-01 NOTE — Patient Instructions (Signed)
Medication Instructions:  Your Physician recommend you continue on your current medication as directed.    *If you need a refill on your cardiac medications before your next appointment, please call your pharmacy*  Follow-Up: At [facility], you and your health needs are our priority.  As part of our continuing mission to provide you with exceptional heart care, we have created designated Provider Care Teams.  These Care Teams include your primary Cardiologist (physician) and Advanced Practice Providers (APPs -  Physician Assistants and Nurse Practitioners) who all work together to provide you with the care you need, when you need it.  We recommend signing up for the patient portal called "MyChart".  Sign up information is provided on this After Visit Summary.  MyChart is used to connect with patients for Virtual Visits (Telemedicine).  Patients are able to view lab/test results, encounter notes, upcoming appointments, etc.  Non-urgent messages can be sent to your provider as well.   To learn more about what you can do with MyChart, go to [email or web address].    Your next appointment:   Follow up in 1 year with Laurann Montana, NP   Other Instructions Heart Healthy Diet Recommendations: A low-salt diet is recommended. Meats should be grilled, baked, or boiled. Avoid fried foods. Focus on lean protein sources like fish or chicken with vegetables and fruits. The American Heart Association is a Microbiologist!  American Heart Association Diet and Lifeystyle Recommendations   Exercise recommendations: The American Heart Association recommends 150 minutes of moderate intensity exercise weekly. Try 30 minutes of moderate intensity exercise 4-5 times per week. This could include walking, jogging, or swimming.   Important Information About Sugar

## 2022-02-01 NOTE — Progress Notes (Signed)
Office Visit    Patient Name: Paul Gates Date of Encounter: 02/01/2022  PCP:  [name], MD   [place]  Cardiologist:  None  Advanced Practice Provider:  Loel Dubonnet, NP Electrophysiologist:  Thompson Grayer, MD      Chief Complaint    Paul Gates is a 61 y.o. male presents today for follow up of PACs, HTN.  Past Medical History    Past Medical History:  Diagnosis Date   Allergy    Blood transfusion without reported diagnosis    Chronic kidney disease    kidney stones   COPD (chronic obstructive pulmonary disease) (HCC)    DDD (degenerative disc disease)    Diabetes mellitus without complication (HCC)    GERD (gastroesophageal reflux disease)    Heart murmur    1971- per pt no issues reported by cardiology about murmur- Echo done   History of kidney stones    Hyperlipidemia    Hypertension    Hypogonadism male    Hypothyroidism    Typical atrial flutter ([place]) 2022   Vitamin D deficiency    Past Surgical History:  Procedure Laterality Date   A-FLUTTER ABLATION N/A 02/01/2021   Procedure: A-FLUTTER ABLATION;  Surgeon: Thompson Grayer, MD;  Location: [facility];  Service: Cardiovascular;  Laterality: N/A;   ASD REPAIR  04/24/1969   open heart   CERVICAL FUSION  04/24/1989   COLONOSCOPY     ESOPHAGOGASTRODUODENOSCOPY  1987,1992,1993,1995   duodenal ulcer   LUMBAR DISC SURGERY     LUMBAR FUSION  04/25/1999   LUMBAR FUSION  02/2021   LUMBAR LAMINECTOMY  10/23/1995   UPPER GASTROINTESTINAL ENDOSCOPY      Allergies  No Known Allergies  History of Present Illness    Paul Gates is a 61 y.o. male with a hx of atrial flutter s/p ablation 01/2021, NICM which resolved with restoration of NSR, hypertension, PACs, hypothyroidism, diabetes, HLD last seen 09/29/21.  When last seen 09/01/21 by Dr. Rayann Heman he had been started on HCTZ by [facility] provider. He noted some hypotensive episodes and PACs which  were symptomatic and noted on EKG. His HCTZ was reduced to 12.'5mg'$  daily. He was encouraged sodium restriction and tobacco cessation.   Last seen 09/29/21 noting PACs were quiescent. BP was well controlled though her preferred to simplify regimen. HCTZ was discontinued.  He presents today for follow up. He notes that is PACs are currently controlled since starting metoprolol. He denies feelings of PACs during the day and while working. He notes he can feel the PACs only while laying flat in bed. 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 He does note that he cannot feel the PACs, however he feels like he can hear them in his left ear. He notes that he is smoking half a pack (10 cigarettes) daily and has tried to stop in the past without success. He is not currently taking his blood pressure at home.   He denies chest pain, SOB, orthopnea, PND, leg swelling, and palpitations.    EKGs/Labs/Other Studies Reviewed:   The following studies were reviewed today:  EKG:  No EKG today.   Recent Labs: 12/12/2021: BUN 19; Creat 1.10; Hemoglobin 14.5; Magnesium 2.0; Platelets 208; Potassium 4.3; Sodium 139; TSH 2.28 01/16/2022: ALT 62  Recent Lipid Panel    Component Value Date/Time   CHOL 84 (L) 01/16/2022 0848   TRIG 159 (  H) 01/16/2022 0848   HDL 29 (L) 01/16/2022 0848   CHOLHDL 2.9 01/16/2022 0848   CHOLHDL 3.5 12/12/2021 1547   VLDL 48 (H) 08/29/2016 1617   LDLCALC 28 01/16/2022 0848   LDLCALC 43 12/12/2021 1547   Home Medications   Current Meds  Medication Sig   acetaminophen (TYLENOL) 500 MG tablet Take 1,000 mg by mouth every 6 (six) hours as needed for moderate pain.   cetirizine (ZYRTEC) 10 MG tablet Take 10 mg by mouth daily.   Cholecalciferol (VITAMIN D-3) 5000 UNITS TABS Take 5,000 Units by mouth daily.   cyclobenzaprine (FLEXERIL) 10 MG tablet Take 10 mg by mouth daily as needed for muscle spasms.   dextroamphetamine (DEXEDRINE SPANSULE)  15 MG 24 hr capsule Take 15 mg by mouth daily as needed (long/busy days).   levothyroxine (SYNTHROID) 50 MCG tablet Take 1 tablet (50 mcg total) by mouth daily before breakfast.   losartan (COZAAR) 100 MG tablet Take  1 tablet  Daily  for BP                                                 /                                       TAKE                             BY                               MOUTH   metFORMIN (GLUCOPHAGE-XR) 750 MG 24 hr tablet Take 2 tablets (1,500 mg total) by mouth daily.   metoprolol succinate (TOPROL XL) 25 MG 24 hr tablet Take 1 tablet (25 mg total) by mouth daily.   naproxen (NAPROSYN) 500 MG tablet Take 1 tablet (500 mg total) by mouth as needed.   omeprazole (PRILOSEC OTC) 20 MG tablet Take 20 mg by mouth daily.   ONE TOUCH ULTRA TEST test strip TEST BLOOD SUGAR 3 TIMES A DAY   ONETOUCH DELICA LANCETS 83M MISC USE TO CHECK SUGAR DAILY   rosuvastatin (CRESTOR) 10 MG tablet Take 1 tablet (10 mg total) by mouth daily.   tamsulosin (FLOMAX) 0.4 MG CAPS capsule Take 1 capsule (0.4 mg total) by mouth daily.   traMADol (ULTRAM) 50 MG tablet Take 50 mg by mouth every 6 (six) hours as needed for pain.   vitamin C (ASCORBIC ACID) 500 MG tablet Take 1,000 mg by mouth daily.   zinc gluconate 50 MG tablet Take 50 mg by mouth daily.     Review of Systems      All other systems reviewed and are otherwise negative except as noted above.  Physical Exam    VS:  BP 123/79 (BP Location: Right Arm, Patient Position: Sitting, Cuff Size: Normal)   Pulse 61   Ht '5\' 10"'$  (1.778 m)   Wt 190 lb 6.4 oz (86.4 kg)   SpO2 95%   BMI 27.32 kg/m  , BMI Body mass index is 27.32 kg/m.  Wt Readings from Last 3 Encounters:  02/01/22 190 lb 6.4 oz (86.4 kg)  12/12/21 184 lb (83.5  kg)  09/29/21 189 lb (85.7 kg)     GEN: Well nourished, well developed, in no acute distress. HEENT: normal. Neck: Supple, no JVD, carotid bruits, or masses. Cardiac: RRR, no murmurs, rubs, or gallops. No  clubbing, cyanosis, edema.  Radials/PT 2+ and equal bilaterally.  Respiratory:  Respirations regular and unlabored, clear to auscultation bilaterally. GI: Soft, nontender, nondistended. MS: No deformity or atrophy. Skin: Warm and dry, no rash. Neuro:  Strength and sensation are intact. Psych: Normal affect.  Assessment & Plan    Atrial flutter - s/p ablation. Follows with Dr. Rayann Heman of EP. No evidence of recurrence. He denies significant palpitations. Continue Metoprolol.  HTN - BP controlled. Continue current antihypertensive regimen of Losartan '100mg'$ . He will monitor BP at home at least 2 hours after medications and sitting for 5-10 minutes.   PAC - 11/2021 TSH 2.28. CMP unremarkable. He reports improvement. Will occasionally have PACs at night while laying down.  Continue to stay well hydrated, avoid caffeine, reduce tobacco. Continue AV nodal blocking agent, Toprol XL '25mg'$  to control PAC. Encouraged smoking cessation as this can be a cause.   Tobacco Abuse: He is smoking half a pack a day. Currently in contemplation stage and wants to quit. He has tried chantix, nicotine gum/patch without success. He was given [id number] information. Will refer to psychology with Dr. Michail Sermon for tobacco cession and education on getting harmful habits. He was educated that smoking can increase cardiovascular disease risk including heart attack and stroke.    DM2 / Hypertriglyceridemia - Continue to follow with PCP. 12/2021 LDL 28. 11/2019 A1C 6.5. Continue Rosuvastatin '20mg'$  daily. Encouraged weight loss and 150 minutes of moderate intensity exercise weekly.    Disposition: Follow up in 1 year(s) with Dr. Rayann Heman or Loel Dubonnet, NP   Signed, Loel Dubonnet, NP 02/01/2022, 3:50 PM [place]

## 2022-02-21 ENCOUNTER — Ambulatory Visit (INDEPENDENT_AMBULATORY_CARE_PROVIDER_SITE_OTHER): Payer: 59 | Admitting: Psychologist

## 2022-02-21 DIAGNOSIS — F17200 Nicotine dependence, unspecified, uncomplicated: Secondary | ICD-10-CM | POA: Diagnosis not present

## 2022-02-21 NOTE — Progress Notes (Signed)
                Paul Mohammed, PsyD 

## 2022-02-21 NOTE — Plan of Care (Signed)
Goals Reduce overall frequency, intensity, of smoking Stabilize smoking habits Enhance ability to effectively cope with full variety of stressors Learn and implement coping skills that result in a reduction of using tobacco substances  Objectives Verbalize an understanding of the cognitive, physiological, and behavioral components of tobacco use Learning and implement calming skills  Verbalize an understanding of the role that cognitive biases play in excessive irrational worry Learn and implement problem solving strategies Identify and engage in pleasant activities Learning and implement personal and interpersonal skills to reduce anxiety and improve interpersonal relationships Learn to accept limitations in life and commit to tolerating, rather than avoiding, unpleasant emotions while accomplishing meaningful goals Maintain involvement in work, family, and social activities Cooperate with a medical evaluation  Interventions Engage the patient in behavioral activation Use Acceptance and Commitment Therapy to help client accept uncomfortable realities in order to accomplish value-consistent goals Reinforce the client's insight into the role of his/her past behavioral patterns Support the client in following through with work, family, and social activities Refer the patient to a physician for a psychotropic medication consultation Monito the clint's psychotropic medication compliance Teach the patient relaxation skills Assign the patient homework Assist the patient in analyzing his or her worries

## 2022-02-21 NOTE — Progress Notes (Signed)
[place] Counselor Initial Adult Exam  Name: Paul Gates Date: 02/21/2022 MRN: [id number] DOB: Aug 23, 1960 PCP: [name], MD  Time spent: 11:05 am to 11:42 am; total time: 37 minutes  This session was held via in person. The patient consented to in-person therapy and was in the clinician's office. Limits of confidentiality were discussed with the patient.   Guardian/Payee:  NA    Paperwork requested: No   Reason for Visit /Presenting Problem: Smoking Cessation  Mental Status Exam: Appearance:   Well Groomed     Behavior:  Appropriate  Motor:  Normal  Speech/Language:   Clear and Coherent  Affect:  Appropriate  Mood:  normal  Thought process:  normal  Thought content:    WNL  Sensory/Perceptual disturbances:    WNL  Orientation:  oriented to person, place, and time/date  Attention:  Good  Concentration:  Good  Memory:  WNL  Fund of knowledge:   Good  Insight:    Good  Judgment:   Fair  Impulse Control:  Good     Reported Symptoms:  The patient endorsed experiencing the following: unsuccessful efforts of cutting down, a strong desire to use tobacco, ongoing use despite experiencing several health conditions, and ongoing taking in larger amounts of tobacco. He denied suicidal and homicidal ideation  Risk Assessment: Danger to Self:  No Self-injurious Behavior: No Danger to Others: No Duty to Warn:no Physical Aggression / Violence:No  Access to Firearms a concern: No  Gang Involvement:No  Patient / guardian was educated about steps to take if suicide or homicide risk level increases between visits: n/a While future psychiatric events cannot be accurately predicted, the patient does not currently require acute inpatient psychiatric care and does not currently meet [facility] involuntary commitment criteria.  Substance Abuse History: Current substance abuse: Yes   Patient is smoking 84 cigarettes weekly.   Past Psychiatric  History:   No previous psychological problems have been observed Outpatient Providers:NA History of Psych Hospitalization: No  Psychological Testing:  NA    Abuse History:  Victim of: No.,  NA    Report needed: No. Victim of Neglect:No. Perpetrator of  NA   Witness / Exposure to Domestic Violence: No   Protective Services Involvement: No  Witness to [employer] Violence:  No   Family History:  Family History  Problem Relation Age of Onset   Hypertension Mother    Stroke Mother    Cancer Mother        ovarian   Alcohol abuse Father    Hypertension Father    Clotting disorder Father        antiphospholipid syndrome    Diabetes Maternal Uncle    Cancer Maternal Grandmother        lung   Cancer Paternal Grandfather        lung   Colon cancer Neg Hx    Stomach cancer Neg Hx    Esophageal cancer Neg Hx    Colon polyps Neg Hx    Rectal cancer Neg Hx     Living situation: the patient lives with their family  Sexual Orientation: Straight  Relationship Status: divorced  Name of spouse / other:NA If a parent, number of children / ages:NA  Support Systems: Immediate family such as mother, and siblings  Financial Stress:  No   Income/Employment/Disability: Employment  Armed forces logistics/support/administrative officer: Yes   Educational History: Education: Hotel [employer] college  Religion/Sprituality/World View: Christian  Any cultural differences that may affect / interfere with  treatment:  not applicable   Recreation/Hobbies: Riding his bike  Stressors: Health problems    Strengths: Supportive Relationships  Barriers:  NA   Legal History: Pending legal issue / charges: The patient has no significant history of legal issues. History of legal issue / charges:  NA  Medical History/Surgical History: reviewed Past Medical History:  Diagnosis Date   Allergy    Blood transfusion without reported diagnosis    Chronic kidney disease    kidney stones   COPD (chronic obstructive pulmonary disease)  (HCC)    DDD (degenerative disc disease)    Diabetes mellitus without complication (HCC)    GERD (gastroesophageal reflux disease)    Heart murmur    1971- per pt no issues reported by cardiology about murmur- Echo done   History of kidney stones    Hyperlipidemia    Hypertension    Hypogonadism male    Hypothyroidism    Typical atrial flutter ([place]) 2022   Vitamin D deficiency     Past Surgical History:  Procedure Laterality Date   A-FLUTTER ABLATION N/A 02/01/2021   Procedure: A-FLUTTER ABLATION;  Surgeon: Thompson Grayer, MD;  Location: [facility];  Service: Cardiovascular;  Laterality: N/A;   ASD REPAIR  04/24/1969   open heart   CERVICAL FUSION  04/24/1989   COLONOSCOPY     ESOPHAGOGASTRODUODENOSCOPY  1987,1992,1993,1995   duodenal ulcer   LUMBAR DISC SURGERY     LUMBAR FUSION  04/25/1999   LUMBAR FUSION  02/2021   LUMBAR LAMINECTOMY  10/23/1995   UPPER GASTROINTESTINAL ENDOSCOPY      Medications: Current Outpatient Medications  Medication Sig Dispense Refill   acetaminophen (TYLENOL) 500 MG tablet Take 1,000 mg by mouth every 6 (six) hours as needed for moderate pain.     cetirizine (ZYRTEC) 10 MG tablet Take 10 mg by mouth daily.     Cholecalciferol (VITAMIN D-3) 5000 UNITS TABS Take 5,000 Units by mouth daily.     cyclobenzaprine (FLEXERIL) 10 MG tablet Take 10 mg by mouth daily as needed for muscle spasms.     dextroamphetamine (DEXEDRINE SPANSULE) 15 MG 24 hr capsule Take 15 mg by mouth daily as needed (long/busy days).     levothyroxine (SYNTHROID) 50 MCG tablet Take 1 tablet (50 mcg total) by mouth daily before breakfast. 90 tablet 2   losartan (COZAAR) 100 MG tablet Take  1 tablet  Daily  for BP                                                 /                                       TAKE                             BY                               MOUTH 90 tablet 3   metFORMIN (GLUCOPHAGE-XR) 750 MG 24 hr tablet Take 2 tablets (1,500 mg total) by mouth  daily. 60 tablet 3   metoprolol succinate (TOPROL XL) 25 MG 24 hr tablet  Take 1 tablet (25 mg total) by mouth daily. 90 tablet 3   naproxen (NAPROSYN) 500 MG tablet Take 1 tablet (500 mg total) by mouth as needed. 60 tablet 1   omeprazole (PRILOSEC OTC) 20 MG tablet Take 20 mg by mouth daily.     ONE TOUCH ULTRA TEST test strip TEST BLOOD SUGAR 3 TIMES A DAY 100 each 3   ONETOUCH DELICA LANCETS 37C MISC USE TO CHECK SUGAR DAILY 100 each 0   rosuvastatin (CRESTOR) 10 MG tablet Take 1 tablet (10 mg total) by mouth daily. 90 tablet 3   tamsulosin (FLOMAX) 0.4 MG CAPS capsule Take 1 capsule (0.4 mg total) by mouth daily. 90 capsule 3   traMADol (ULTRAM) 50 MG tablet Take 50 mg by mouth every 6 (six) hours as needed for pain.     vitamin C (ASCORBIC ACID) 500 MG tablet Take 1,000 mg by mouth daily.     zinc gluconate 50 MG tablet Take 50 mg by mouth daily.     No current facility-administered medications for this visit.    No Known Allergies  Diagnoses:   F17.200 tobacco use disorder, moderate   Plan of Care: The patient is a 61 year old Caucasian male who was referred for smoking cessation. The patient lives with his mother. 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 He denied suicidal and homicidal ideation.  The patient stated that he wants to quit smoking.  This psychologist makes the recommendation that the patient participate in therapy at least monthly and if possible bi-weekly to assist him in meeting his goals.    Conception Chancy, PsyD

## 2022-03-20 ENCOUNTER — Ambulatory Visit: Payer: 59 | Admitting: Nurse Practitioner

## 2022-03-22 ENCOUNTER — Ambulatory Visit: Payer: 59 | Admitting: Psychologist

## 2022-03-22 DIAGNOSIS — F17200 Nicotine dependence, unspecified, uncomplicated: Secondary | ICD-10-CM | POA: Diagnosis not present

## 2022-03-22 NOTE — Progress Notes (Signed)
Paul Gates/Therapist Progress Note  Patient ID: Paul Gates, MRN: [id number],    Date: 03/22/2022  Time Spent: 03:10 pm to 03:50 pm; total time: 40 minutes   This session was held via in person. The patient consented to in-person therapy and was in the clinician's office. Limits of confidentiality were discussed with the patient.    Treatment Type: Individual Therapy  Reported Symptoms: Smoking  Mental Status Exam: Appearance:  Well Groomed     Behavior: Appropriate  Motor: Normal  Speech/Language:  Clear and Coherent  Affect: Appropriate  Mood: normal  Thought process: normal  Thought content:   WNL  Sensory/Perceptual disturbances:   WNL  Orientation: oriented to person, place, and time/date  Attention: Good  Concentration: Good  Memory: WNL  Fund of knowledge:  Good  Insight:   Good  Judgment:  Good  Impulse Control: Good   Risk Assessment: Danger to Self:  No Self-injurious Behavior: No Danger to Others: No Duty to Warn:no Physical Aggression / Violence:No  Access to Firearms a concern: No  Gang Involvement:No   Subjective: Beginning the session, patient described himself as doing okay. After reviewing the treatment plan, patient reflected on Thanksgiving. From there, he disclosed that he has decreased his tobacco use and is only smoking 7 cigarettes daily, primarily at work. Patient spent the session, identifying a stop date (Saturday December 2nd), smoking triggers, how to overcome triggers, and an accountability partner. He was agreeable to homework and following up. He denied suicidal and homicidal ideation.    Interventions:  Worked on developing a therapeutic relationship with the patient using active listening and reflective statements. Provided emotional support using empathy and validation. Reviewed the treatment plan with the patient. Reviewed events since the intake occurred. Normalized and validated thoughts and emotions.  Identified goals for the session. Praised the patient for steps already taken to stop tobacco use. Identified a stopping date and explored how realistic the stopping date is for the patient. Used socratic questions to assist the patient. Explored reasons to make behavioral change. Processed triggers and identified alternative behaviors for triggers including cleaning out vehicle and having a snack to go to instead of cigarettes. Provided some psychoeducation about nuts. Processed who could be accountability partner. Processed thoughts and emotions. Provided empathic statements. Assigned homework. Assessed for suicidal and homicidal ideation.   Homework: Stop day is December 2nd, implement bubble gum smell for vehicle, use index cards for reasons to make a change, have snacks at work, and use accountability partner.   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 use disorder, moderate  Plan:   Goals Reduce overall frequency, intensity, of smoking Stabilize smoking habits Enhance ability to effectively cope with full variety of stressors Learn and implement coping skills that result in a reduction of using tobacco substances   Objectives target date for all objectives is 02/22/2023 Verbalize an understanding of the cognitive, physiological, and behavioral components of tobacco use Learning and implement calming skills  Verbalize an understanding of the role that cognitive biases play in excessive irrational worry Learn and implement problem solving strategies Identify and engage in pleasant activities Learning and implement personal and interpersonal skills to reduce anxiety and improve interpersonal relationships Learn to accept limitations in life and commit to tolerating, rather than avoiding, unpleasant emotions while accomplishing meaningful goals Maintain involvement in work, family, and social activities Cooperate with a medical evaluation   Interventions Engage the patient in behavioral activation Use Acceptance and  Commitment Therapy to help client accept uncomfortable realities in order to accomplish value-consistent goals Reinforce the client's insight into the role of his/her past behavioral patterns Support the client in following through with work, family, and social activities Refer the patient to a physician for a psychotropic medication consultation Monito the clint's psychotropic medication compliance Teach the patient relaxation skills Assign the patient homework Assist the patient in analyzing his or her worries  The patient and clinician reviewed the treatment plan on 03/22/2022. The patient approved of the treatment plan.   Conception Chancy, PsyD

## 2022-03-22 NOTE — Progress Notes (Signed)
                Jayvian Escoe, PsyD 

## 2022-03-23 ENCOUNTER — Other Ambulatory Visit: Payer: Self-pay | Admitting: Nurse Practitioner

## 2022-03-23 DIAGNOSIS — N182 Chronic kidney disease, stage 2 (mild): Secondary | ICD-10-CM

## 2022-03-28 ENCOUNTER — Ambulatory Visit (INDEPENDENT_AMBULATORY_CARE_PROVIDER_SITE_OTHER): Payer: 59 | Admitting: Nurse Practitioner

## 2022-03-28 ENCOUNTER — Encounter: Payer: Self-pay | Admitting: Nurse Practitioner

## 2022-03-28 VITALS — BP 148/90 | HR 76 | Temp 97.7°F | Ht 70.0 in | Wt 190.0 lb

## 2022-03-28 DIAGNOSIS — I10 Essential (primary) hypertension: Secondary | ICD-10-CM

## 2022-03-28 DIAGNOSIS — E785 Hyperlipidemia, unspecified: Secondary | ICD-10-CM

## 2022-03-28 DIAGNOSIS — E039 Hypothyroidism, unspecified: Secondary | ICD-10-CM

## 2022-03-28 DIAGNOSIS — E1122 Type 2 diabetes mellitus with diabetic chronic kidney disease: Secondary | ICD-10-CM | POA: Diagnosis not present

## 2022-03-28 DIAGNOSIS — F17200 Nicotine dependence, unspecified, uncomplicated: Secondary | ICD-10-CM

## 2022-03-28 DIAGNOSIS — E663 Overweight: Secondary | ICD-10-CM | POA: Diagnosis not present

## 2022-03-28 DIAGNOSIS — Z79899 Other long term (current) drug therapy: Secondary | ICD-10-CM

## 2022-03-28 DIAGNOSIS — F988 Other specified behavioral and emotional disorders with onset usually occurring in childhood and adolescence: Secondary | ICD-10-CM

## 2022-03-28 DIAGNOSIS — E559 Vitamin D deficiency, unspecified: Secondary | ICD-10-CM

## 2022-03-28 DIAGNOSIS — E1169 Type 2 diabetes mellitus with other specified complication: Secondary | ICD-10-CM

## 2022-03-28 DIAGNOSIS — N182 Chronic kidney disease, stage 2 (mild): Secondary | ICD-10-CM

## 2022-03-28 NOTE — Patient Instructions (Signed)

## 2022-03-28 NOTE — Progress Notes (Signed)
FOLLOW UP  Assessment and Plan:   Hypertension Discussed DASH (Dietary Approaches to Stop Hypertension) DASH diet is lower in sodium than a typical American diet. Cut back on foods that are high in saturated fat, cholesterol, and trans fats. Eat more whole-grain foods, fish, poultry, and nuts Remain active and exercise as tolerated daily.  Monitor BP at home-Call if greater than 130/80.  Check CMP/CBC   Hyperlipidemia associated with T2DM (HCC) Discussed lifestyle modifications. Recommended diet heavy in fruits and veggies, omega 3's. Decrease consumption of animal meats, cheeses, and dairy products. Remain active and exercise as tolerated. Continue to monitor. Check lipids/TSH   Diabetes with diabetic chronic kidney disease [facility]) Education: Reviewed 'ABCs' of diabetes management  Discussed goals to be met and/or maintained include A1C (<7) Blood pressure (<130/80) Cholesterol (LDL <70) Continue Eye Exam yearly  Continue Dental Exam Q6 mo Discussed dietary recommendations Discussed Physical Activity recommendations Foot exam UTD Check A1C   CKD II associated with T2DM ([place]) Discussed how what you eat and drink can aide in kidney protection. Stay well hydrated. Avoid high salt foods. Avoid NSAIDS. Keep BP and BG well controlled.   Take medications as prescribed. Remain active and exercise as tolerated daily. Maintain weight.  Continue to monitor. Check CMP/GFR/Microablumin   Overweight with co morbidities Discussed appropriate BMI Goal of losing 1 lb per month. Diet modification. Physical activity. Encouraged/praised to build confidence.   Vitamin D Def At goal at last chec; continue supplementation Defer vit D level to CPE  Hypothyroidism Controlled. Continue Levothyroxine. Reminded to take on an empty stomach 30-37mns before food.  Stop any Biotin Supplement 48-72 hours before next TSH level to reduce the risk of falsely low TSH levels. Continue to  monitor.     Tobacco use Discussed risks associated with tobacco use and advised to reduce or quit Patient stopped smoking 8 days ago, using lozenges Will follow up at the next visit  ADD Continue medications Dexedrine as needed Helps with focus, no AE's; uses rarely The patient was counseled on the addictive nature of the medication and was encouraged to take drug holidays when not needed.   PAC Continue HCTZ 12.5 mg Continue follow up with Dr. ARayann Heman cardiology  Orders Placed This Encounter  Procedures   CBC with Differential/Platelet   COMPLETE METABOLIC PANEL WITH GFR   Lipid panel   Hemoglobin A1c    Notify office for further evaluation and treatment, questions or concerns if any reported s/s fail to improve.   The patient was advised to call back or seek an in-person evaluation if any symptoms worsen or if the condition fails to improve as anticipated.   Further disposition pending results of labs. Discussed med's effects and SE's.    I discussed the assessment and treatment plan with the patient. The patient was provided an opportunity to ask questions and all were answered. The patient agreed with the plan and demonstrated an understanding of the instructions.  Discussed med's effects and SE's. Screening labs and tests as requested with regular follow-up as recommended.  I provided 20 minutes of face-to-face time during this encounter including counseling, chart review, and critical decision making was preformed.   Future Appointments  Date Time Provider [place] 04/26/2022  2:00 PM SConception Chancy PsyD [facility]  07/17/2022  3:00 PM MUnk Pinto MD [facility] None    ----------------------------------------------------------------------------------------------------------------------  HPI 61y.o. male  presents for 3 month follow up on hypertension, cholesterol, T2 diabetes, weight, hypothyroid, tobacco use, hypogonadism  on testosterone  supplement, ADD and vitamin D deficiency.   Overall he is doing well.    He is continuing to switch care away from [place] and is going to get all meds refilled here.   he currently is trying to quit smoking.  Has had 1 cigarette in 6 days.  Using nicotine patch.   He has COPD per imaging, CXR. Denies coughing, dyspnea, wheezing. Discussed low dose CT, would consider but wants to hold off until CPE.    Patient is on an ADD medication,  15 mg dextroamphetamine PRN; he takes very rarely - typically 1-2 occasions a month. He states that the medication is helping and he denies any adverse reactions.  He was evaluated by  Dr. Rayann Heman- cardiology on 09/01/21 and was shown to have PAC's. His HCTZ was decreased to 12.5 mg. Atrial flutter resolved post ablation. Has follow up scheduled for 09/29/21. He has noted less PAC's since lowering dose.   BMI is Body mass index is 27.26 kg/m., he has been working on diet but not much exercise. Has purchased a new exercise resistance band.  Wt Readings from Last 3 Encounters:  03/28/22 190 lb (86.2 kg)  02/01/22 190 lb 6.4 oz (86.4 kg)  12/12/21 184 lb (83.5 kg)   His blood pressure has been controlled at home, today their BP is BP: (!) 148/90  Elevated in clinic today. BP Readings from Last 3 Encounters:  03/28/22 (!) 148/90  02/01/22 123/79  12/12/21 120/60   On losartan for renal benefits with T2DM.   He does workout. He denies chest pain, shortness of breath, dizziness.   He is not on cholesterol medication, only taking fish oil  and denies myalgias. His LDL cholesterol is at goal, trigs on non-fasting labs are mildly elevated. The cholesterol last visit was:   Lab Results  Component Value Date   CHOL 84 (L) 01/16/2022   HDL 29 (L) 01/16/2022   LDLCALC 28 01/16/2022   TRIG 159 (H) 01/16/2022   CHOLHDL 2.9 01/16/2022    He has been working on diet and exercise for T2DM (controlled in normal range on metformin 1500 mg daily) and denies increased  appetite, nausea, paresthesia of the feet, polydipsia, polyuria, visual disturbances and vomiting.  He admits hasn't checked glucose in a while-  Last A1C in the office was:  Lab Results  Component Value Date   HGBA1C 6.5 (H) 12/12/2021    He has CKD II associated with T2DM monitored at this office:  Lab Results  Component Value Date   EGFR 78 12/12/2021    He is on thyroid medication. His medication was not changed last visit.   Lab Results  Component Value Date   TSH 2.28 12/12/2021   Patient is on Vitamin D supplement and at goal at last check:    Lab Results  Component Value Date   VD25OH 66 12/12/2021     He has history of testosterone deficiency and is on testosterone replacement, taking 400 mg every 2 weeks, last shot 09/04/21. Is going to stop taking injections  Lab Results  Component Value Date   TESTOSTERONE 653 06/06/2021     Current Medications:  Current Outpatient Medications on File Prior to Visit  Medication Sig   acetaminophen (TYLENOL) 500 MG tablet Take 1,000 mg by mouth every 6 (six) hours as needed for moderate pain.   cetirizine (ZYRTEC) 10 MG tablet Take 10 mg by mouth daily.   Cholecalciferol (VITAMIN D-3) 5000 UNITS TABS Take 5,000  Units by mouth daily.   cyclobenzaprine (FLEXERIL) 10 MG tablet Take 10 mg by mouth daily as needed for muscle spasms.   dextroamphetamine (DEXEDRINE SPANSULE) 15 MG 24 hr capsule Take 15 mg by mouth daily as needed (long/busy days).   levothyroxine (SYNTHROID) 50 MCG tablet Take 1 tablet (50 mcg total) by mouth daily before breakfast.   losartan (COZAAR) 100 MG tablet Take  1 tablet  Daily  for BP                                                 /                                       TAKE                             BY                               MOUTH   metFORMIN (GLUCOPHAGE-XR) 750 MG 24 hr tablet TAKE 2 TABLETS (1,500 MG TOTAL) BY MOUTH DAILY.   metoprolol succinate (TOPROL XL) 25 MG 24 hr tablet Take 1 tablet (25 mg  total) by mouth daily.   naproxen (NAPROSYN) 500 MG tablet Take 1 tablet (500 mg total) by mouth as needed.   omeprazole (PRILOSEC OTC) 20 MG tablet Take 20 mg by mouth daily.   ONE TOUCH ULTRA TEST test strip TEST BLOOD SUGAR 3 TIMES A DAY   ONETOUCH DELICA LANCETS 81X MISC USE TO CHECK SUGAR DAILY   rosuvastatin (CRESTOR) 10 MG tablet Take 1 tablet (10 mg total) by mouth daily.   tamsulosin (FLOMAX) 0.4 MG CAPS capsule Take 1 capsule (0.4 mg total) by mouth daily.   traMADol (ULTRAM) 50 MG tablet Take 50 mg by mouth every 6 (six) hours as needed for pain.   vitamin C (ASCORBIC ACID) 500 MG tablet Take 1,000 mg by mouth daily.   zinc gluconate 50 MG tablet Take 50 mg by mouth daily.   No current facility-administered medications on file prior to visit.     Allergies: No Known Allergies   Medical History:  Past Medical History:  Diagnosis Date   Allergy    Blood transfusion without reported diagnosis    Chronic kidney disease    kidney stones   COPD (chronic obstructive pulmonary disease) (HCC)    DDD (degenerative disc disease)    Diabetes mellitus without complication (HCC)    GERD (gastroesophageal reflux disease)    Heart murmur    1971- per pt no issues reported by cardiology about murmur- Echo done   History of kidney stones    Hyperlipidemia    Hypertension    Hypogonadism male    Hypothyroidism    Typical atrial flutter ([place]) 2022   Vitamin D deficiency    Family history- Reviewed and unchanged Social history- Reviewed and unchanged   Review of Systems:  Review of Systems  Constitutional:  Negative for malaise/fatigue and weight loss.  HENT:  Negative for hearing loss and tinnitus.   Eyes:  Negative for blurred vision and double vision.  Respiratory:  Negative for cough, shortness of breath  and wheezing.   Cardiovascular:  Negative for chest pain, palpitations, orthopnea, claudication and leg swelling.  Gastrointestinal:  Negative for abdominal pain, blood in  stool, constipation, diarrhea, heartburn, melena, nausea and vomiting.  Genitourinary: Negative.   Musculoskeletal:  Negative for joint pain and myalgias.  Skin:  Negative for rash.  Neurological:  Negative for dizziness, tingling, sensory change, weakness and headaches.  Endo/Heme/Allergies:  Negative for polydipsia.  Psychiatric/Behavioral: Negative.    All other systems reviewed and are negative.    Physical Exam: BP (!) 148/90   Pulse 76   Temp 97.7 F (36.5 C)   Ht 5' 10" (1.778 m)   Wt 190 lb (86.2 kg)   SpO2 97%   BMI 27.26 kg/m  Wt Readings from Last 3 Encounters:  03/28/22 190 lb (86.2 kg)  02/01/22 190 lb 6.4 oz (86.4 kg)  12/12/21 184 lb (83.5 kg)   General Appearance: Well nourished, in no apparent distress. Eyes: PERRLA, EOMs, conjunctiva no swelling or erythema Sinuses: No Frontal/maxillary tenderness ENT/Mouth: Ext aud canals clear, TMs without erythema, bulging. No erythema, swelling, or exudate on post pharynx.  Tonsils not swollen or erythematous. Hearing normal.  Neck: Supple, thyroid normal.  Respiratory: Respiratory effort normal, BS equal bilaterally without rales, rhonchi, wheezing or stridor.  Cardio: RRR with no MRGs. Brisk peripheral pulses without edema.  Abdomen: Soft, + BS.  Non tender, no guarding, rebound, hernias, masses. Lymphatics: Non tender without lymphadenopathy.  Musculoskeletal: Full ROM, 5/5 strength, Normal gait Skin: Warm, dry without rashes, lesions, ecchymosis.  Neuro: Cranial nerves intact. No cerebellar symptoms.  Psych: Awake and oriented X 3, normal affect, Insight and Judgment appropriate.    Darrol Jump, NP 4:00 PM [facility]

## 2022-03-29 LAB — COMPLETE METABOLIC PANEL WITH GFR
AG Ratio: 2 (calc) (ref 1.0–2.5)
ALT: 50 U/L — ABNORMAL HIGH (ref 9–46)
AST: 30 U/L (ref 10–35)
Albumin: 4.8 g/dL (ref 3.6–5.1)
Alkaline phosphatase (APISO): 78 U/L (ref 35–144)
BUN: 18 mg/dL (ref 7–25)
CO2: 26 mmol/L (ref 20–32)
Calcium: 10 mg/dL (ref 8.6–10.3)
Chloride: 99 mmol/L (ref 98–110)
Creat: 0.87 mg/dL (ref 0.70–1.30)
Globulin: 2.4 g/dL (calc) (ref 1.9–3.7)
Glucose, Bld: 95 mg/dL (ref 65–99)
Potassium: 4.4 mmol/L (ref 3.5–5.3)
Sodium: 136 mmol/L (ref 135–146)
Total Bilirubin: 0.4 mg/dL (ref 0.2–1.2)
Total Protein: 7.2 g/dL (ref 6.1–8.1)
eGFR: 99 mL/min/{1.73_m2} (ref >=60)

## 2022-03-29 LAB — CBC WITH DIFFERENTIAL/PLATELET
Absolute Monocytes: 851 cells/uL (ref 200–950)
Basophils Absolute: 35 cells/uL (ref 0–200)
Basophils Relative: 0.3 %
Eosinophils Absolute: 81 cells/uL (ref 15–500)
Eosinophils Relative: 0.7 %
HCT: 41.4 % (ref 38.5–50.0)
Hemoglobin: 14.3 g/dL (ref 13.2–17.1)
Lymphs Abs: 3002 cells/uL (ref 850–3900)
MCH: 30.6 pg (ref 27.0–33.0)
MCHC: 34.5 g/dL (ref 32.0–36.0)
MCV: 88.7 fL (ref 80.0–100.0)
MPV: 11.5 fL (ref 7.5–12.5)
Monocytes Relative: 7.4 %
Neutro Abs: 7533 cells/uL (ref 1500–7800)
Neutrophils Relative %: 65.5 %
Platelets: 229 10*3/uL (ref 140–400)
RBC: 4.67 10*6/uL (ref 4.20–5.80)
RDW: 12.8 % (ref 11.0–15.0)
Total Lymphocyte: 26.1 %
WBC: 11.5 10*3/uL — ABNORMAL HIGH (ref 3.8–10.8)

## 2022-03-29 LAB — HEMOGLOBIN A1C
Hgb A1c MFr Bld: 7.5 % of total Hgb — ABNORMAL HIGH (ref <5.7)
Mean Plasma Glucose: 169 mg/dL
eAG (mmol/L): 9.3 mmol/L

## 2022-03-29 LAB — LIPID PANEL
Cholesterol: 91 mg/dL (ref <200)
HDL: 34 mg/dL — ABNORMAL LOW (ref >=40)
LDL Cholesterol (Calc): 34 mg/dL (calc)
Non-HDL Cholesterol (Calc): 57 mg/dL (calc) (ref <130)
Total CHOL/HDL Ratio: 2.7 (calc) (ref <5.0)
Triglycerides: 152 mg/dL — ABNORMAL HIGH (ref <150)

## 2022-03-30 ENCOUNTER — Encounter: Payer: Self-pay | Admitting: Internal Medicine

## 2022-04-05 ENCOUNTER — Ambulatory Visit: Payer: 59 | Admitting: Nurse Practitioner

## 2022-04-05 ENCOUNTER — Encounter: Payer: Self-pay | Admitting: Nurse Practitioner

## 2022-04-05 VITALS — BP 147/86 | HR 65 | Temp 97.8°F

## 2022-04-05 DIAGNOSIS — U071 COVID-19: Secondary | ICD-10-CM

## 2022-04-05 MED ORDER — PROMETHAZINE-DM 6.25-15 MG/5ML PO SYRP: 5.0000 mL | ORAL_SOLUTION | Freq: Four times a day (QID) | ORAL | 1 refills | Status: DC | PRN

## 2022-04-05 MED ORDER — BENZONATATE 100 MG PO CAPS: 100.0000 mg | ORAL_CAPSULE | Freq: Four times a day (QID) | ORAL | 1 refills | Status: DC | PRN

## 2022-04-05 MED ORDER — DEXAMETHASONE 1 MG PO TABS: ORAL_TABLET | ORAL | 0 refills | Status: DC

## 2022-04-05 MED ORDER — AZITHROMYCIN 250 MG PO TABS: ORAL_TABLET | ORAL | 1 refills | Status: DC

## 2022-04-05 NOTE — Patient Instructions (Signed)
Take tylenol PRN temp 101+ Push hydration Regular ambulation or calf exercises exercises for clot prevention and 81 mg ASA unless contraindicated Sx supportive therapy suggested Follow up via mychart or telephone if needed Advised patient obtain O2 monitor; present to ED if persistently <90% or with severe dyspnea, CP, fever uncontrolled by tylenol, confusion, sudden decline Should remain in isolation until at  5 days from testing positive and then the following 5 days he is to wear a mask anytime he is around other peopl

## 2022-04-05 NOTE — Progress Notes (Signed)
THIS ENCOUNTER IS A VIRTUAL VISIT DUE TO COVID-19 - PATIENT WAS NOT SEEN IN THE OFFICE.  PATIENT HAS CONSENTED TO VIRTUAL VISIT / TELEMEDICINE VISIT   Virtual Visit via telephone Note  I connected with  Paul Gates on 04/05/2022 by telephone.  I verified that I am speaking with the correct person using two identifiers.    I discussed the limitations of evaluation and management by telemedicine and the availability of in person appointments. The patient expressed understanding and agreed to proceed.  History of Present Illness:  BP (!) 147/86   Pulse 65   Temp 97.8 F (36.6 C)  61 y.o. patient contacted office reporting URI sx . he tested positive home test. OV was conducted by telephone to minimize exposure. This patient was vaccinated for covid 19, last 11/2021   Sx began 3  days ago with productive cough of yellow mucus, congestion, sore throat,fever, headache  Treatments tried so far: none  Exposures: Unknown  Has stopped smoking, only 3 cigarettes in the past 2 weeks  Medications  Current Outpatient Medications (Endocrine & Metabolic):    levothyroxine (SYNTHROID) 50 MCG tablet, Take 1 tablet (50 mcg total) by mouth daily before breakfast.   metFORMIN (GLUCOPHAGE-XR) 750 MG 24 hr tablet, TAKE 2 TABLETS (1,500 MG TOTAL) BY MOUTH DAILY.  Current Outpatient Medications (Cardiovascular):    losartan (COZAAR) 100 MG tablet, Take  1 tablet  Daily  for BP                                                 /                                       TAKE                             BY                               MOUTH   metoprolol succinate (TOPROL XL) 25 MG 24 hr tablet, Take 1 tablet (25 mg total) by mouth daily.   rosuvastatin (CRESTOR) 10 MG tablet, Take 1 tablet (10 mg total) by mouth daily.  Current Outpatient Medications (Respiratory):    cetirizine (ZYRTEC) 10 MG tablet, Take 10 mg by mouth daily. (Patient not taking: Reported on 04/05/2022)  Current Outpatient  Medications (Analgesics):    acetaminophen (TYLENOL 8 HOUR ARTHRITIS PAIN) 650 MG CR tablet, Take 650 mg by mouth every 8 (eight) hours as needed for pain.   acetaminophen (TYLENOL) 500 MG tablet, Take 1,000 mg by mouth every 6 (six) hours as needed for moderate pain.   traMADol (ULTRAM) 50 MG tablet, Take 50 mg by mouth every 6 (six) hours as needed for pain.   naproxen (NAPROSYN) 500 MG tablet, Take 1 tablet (500 mg total) by mouth as needed. (Patient not taking: Reported on 04/05/2022)   Current Outpatient Medications (Other):    Cholecalciferol (VITAMIN D-3) 5000 UNITS TABS, Take 5,000 Units by mouth daily.   cyclobenzaprine (FLEXERIL) 10 MG tablet, Take 10 mg by mouth daily as needed for muscle spasms.   omeprazole (PRILOSEC OTC) 20 MG tablet,  Take 20 mg by mouth daily.   ONE TOUCH ULTRA TEST test strip, TEST BLOOD SUGAR 3 TIMES A DAY   ONETOUCH DELICA LANCETS 09N MISC, USE TO CHECK SUGAR DAILY   tamsulosin (FLOMAX) 0.4 MG CAPS capsule, Take 1 capsule (0.4 mg total) by mouth daily.   vitamin C (ASCORBIC ACID) 500 MG tablet, Take 1,000 mg by mouth daily.   zinc gluconate 50 MG tablet, Take 50 mg by mouth daily.   dextroamphetamine (DEXEDRINE SPANSULE) 15 MG 24 hr capsule, Take 15 mg by mouth daily as needed (long/busy days). (Patient not taking: Reported on 04/05/2022)  Allergies: No Known Allergies  Problem list He has Essential hypertension; Hyperlipidemia associated with type 2 diabetes mellitus ([place]); T2_NIDDM w/Stage 2 CKD (GFR 82 ml/min); Vitamin D deficiency; Lumbago; DDD (degenerative disc disease), lumbar; Testosterone deficiency; Medication management; ADD (attention deficit disorder); History of kidney stones; Tobacco use disorder; COPD (chronic obstructive pulmonary disease) ([place]); Onychomycosis of toenail; Hypothyroid; Overweight (BMI 25.0-29.9); CKD stage 2 due to type 2 diabetes mellitus ([place]); Lumbar stenosis with neurogenic claudication; and Palpitations on their problem  list.   Social History:   reports that he has been smoking cigarettes. He started smoking about 43 years ago. He has been smoking an average of .25 packs per day. He has never used smokeless tobacco. He reports current alcohol use of about 4.0 standard drinks of alcohol per week. He reports that he does not use drugs.  Observations/Objective:  General : Well sounding patient in no apparent distress HEENT: no hoarseness, no cough for duration of visit Lungs: speaks in complete sentences, no audible wheezing, no apparent distress Neurological: alert, oriented x 3 Psychiatric: pleasant, judgement appropriate   Assessment and Plan:  Covid 19 Covid 19 positive per rapid screening test at home Risk factors include: has Essential hypertension; Hyperlipidemia associated with type 2 diabetes mellitus ([place]); T2_NIDDM w/Stage 2 CKD (GFR 82 ml/min); Vitamin D deficiency; Lumbago; DDD (degenerative disc disease), lumbar; Testosterone deficiency; Medication management; ADD (attention deficit disorder); History of kidney stones; Tobacco use disorder; COPD (chronic obstructive pulmonary disease) ([place]); Onychomycosis of toenail; Hypothyroid; Overweight (BMI 25.0-29.9); CKD stage 2 due to type 2 diabetes mellitus ([place]); Lumbar stenosis with neurogenic claudication; and Palpitations on their problem list.  Symptoms are: mild Immue support reviewed  Take tylenol PRN temp 101+ Push hydration Regular ambulation or calf exercises exercises for clot prevention and 81 mg ASA unless contraindicated Sx supportive therapy suggested Follow up via mychart or telephone if needed Advised patient obtain O2 monitor; present to ED if persistently <90% or with severe dyspnea, CP, fever uncontrolled by tylenol, confusion, sudden decline Should remain in isolation until at  5 days from testing positive and then the following 5 days he is to wear a mask anytime he is around other people  Bright was seen today for acute  visit.  Diagnoses and all orders for this visit:  COVID -     dexamethasone (DECADRON) 1 MG tablet; Take 3 tabs for 3 days, 2 tabs for 3 days 1 tab for 5 days. Take with food. -     promethazine-dextromethorphan (PROMETHAZINE-DM) 6.25-15 MG/5ML syrup; Take 5 mLs by mouth 4 (four) times daily as needed for cough. -     azithromycin (ZITHROMAX) 250 MG tablet; Take 2 tablets (500 mg) on  Day 1,  followed by 1 tablet (250 mg) once daily on Days 2 through 5. -     benzonatate (TESSALON PERLES) 100 MG capsule; Take 1 capsule (100  mg total) by mouth every 6 (six) hours as needed for cough.        Follow Up Instructions:  I discussed the assessment and treatment plan with the patient. The patient was provided an opportunity to ask questions and all were answered. The patient agreed with the plan and demonstrated an understanding of the instructions.   The patient was advised to call back or seek an in-person evaluation if the symptoms worsen or if the condition fails to improve as anticipated.  I provided 20 minutes of non-face-to-face time during this encounter.   Alycia Rossetti, NP

## 2022-04-26 ENCOUNTER — Ambulatory Visit: Payer: 59 | Admitting: Psychologist

## 2022-06-01 ENCOUNTER — Encounter (HOSPITAL_COMMUNITY): Payer: Self-pay | Admitting: *Deleted

## 2022-06-12 ENCOUNTER — Encounter: Payer: 59 | Admitting: Internal Medicine

## 2022-06-23 ENCOUNTER — Encounter: Payer: Self-pay | Admitting: Nurse Practitioner

## 2022-06-23 ENCOUNTER — Other Ambulatory Visit: Payer: Self-pay | Admitting: Nurse Practitioner

## 2022-06-23 DIAGNOSIS — F17200 Nicotine dependence, unspecified, uncomplicated: Secondary | ICD-10-CM

## 2022-06-23 MED ORDER — VARENICLINE TARTRATE 1 MG PO TABS: ORAL_TABLET | ORAL | 2 refills | Status: DC

## 2022-07-03 LAB — HM DIABETES EYE EXAM

## 2022-07-05 ENCOUNTER — Encounter: Payer: Self-pay | Admitting: Internal Medicine

## 2022-07-16 NOTE — Progress Notes (Incomplete)
Annual  Screening/Preventative Visit  & Comprehensive Evaluation & Examination  Future Appointments  Date Time Provider Department  07/17/2022  3:00 PM [name], MD [facility]  07/25/2023  3:00 PM [name], MD [facility]             This very nice 62 y.o. single WM with  HTN, HLD, T2_NIDDM, Hypothyroidism, Testosterone  and Vitamin D Deficiency presents for a Screening /Preventative Visit & comprehensive evaluation and management of multiple medical co-morbidities.   Patient also is on Dexadrine for Adult ADD  &  reports improved focus & concentration.       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        HTN predates since  2014. Patient's BP has been controlled at home.  Today's BP  is  at goal -                   .   Last Aug 2022 , patient was discovered in Atrial Flutter and underwent ablation in October 2022  by Dr Rayann Heman.  Patient denies any cardiac symptoms as chest pain, palpitations, shortness of breath, dizziness or ankle swelling.       Patient's hyperlipidemia is controlled with diet . Last lipids were at goal :  Lab Results  Component Value Date   CHOL 91 03/28/2022   HDL 34 (L) 03/28/2022   LDLCALC 34 03/28/2022   TRIG 152 (H) 03/28/2022   CHOLHDL 2.7 03/28/2022         Patient has hx/o T2_NIDDM since  2013  and patient denies reactive hypoglycemic symptoms, visual blurring, diabetic polys or paresthesias. Last A1c was not at goal :   Lab Results  Component Value Date   HGBA1C 7.5 (H) 03/28/2022          Patient  was dx'd Hypothyroid in 2015 has been on Thyroid Replacement since. Patient also is on Depo-Testosterone for Testosterone Deficiency &  reports  improved stamina & sense of well being       Finally, patient has history of Vitamin D Deficiency ("33" /2013) and last vitamin D was at  goal :   Lab Results  Component Value Date   VD25OH 66 12/12/2021       Current Outpatient Medications on File Prior to Visit  Medication Sig  . acetaminophen (TYLENOL) 500 MG tablet Take 1,000 mg  every 6  hours as needed   . cetirizine 10 MG tablet Take 1 daily.  Marland Kitchen VITAMIN D 5000  u Take daily.  . cyclobenzaprine 10 MG tablet Take 1 daily as needed for muscle spasms.  . DEXEDRINE 15 MG 24 hr capsule Take  daily as needed (long/busy days).  Marland Kitchen ibuprofen (ADVIL) 200 MG tablet Take 600 mg  every 6 hours as needed   . levothyroxine 50 MCG tablet TAKE 1 TABLET DAILY   . losartan 25 MG tablet Take 1 tablet daily.  . metFORMIN-XR 500 MG  Take 1 tablet 3 times daily with meals.  . methocarbamol  500 MG tablet Take 1 tablet every 6  hours as needed   . omeprazole 20 MG tablet Take daily.  . tamsulosin (FLOMAX) 0.4 MG CAPS capsule Take daily.  Marland Kitchen testosterone cypio  200 MG/ML injection USE 2ML IM EVERY 2 WEEKS   . traMADol  50 MG tablet  Take 5 every 6  hours as needed for pain.  . vitamin C  500 MG tablet Take 1,000 mg daily.    No Known Allergies   Past Medical History:  Diagnosis Date  . COPD (chronic obstructive pulmonary disease) ([place])   . DDD (degenerative disc disease)   . Depression 10/09/2013  . Diabetes mellitus without complication ([place])   . GERD (gastroesophageal reflux disease)   . Heart murmur    1971- per pt no issues reported by cardiology about murmur- Echo done  . History of kidney stones   . Hyperlipidemia   . Hypertension   . Hypogonadism male   . Hypothyroidism   . Typical atrial flutter ([place]) 2022  . Vitamin D deficiency      Health Maintenance  Topic Date Due  . Zoster Vaccines- Shingrix (1 of 2) Never done  . FOOT EXAM  05/24/2021  . HEMOGLOBIN A1C  09/11/2021  . OPHTHALMOLOGY EXAM  04/13/2022  . TETANUS/TDAP  07/18/2023  . COLONOSCOPY  08/28/2023  . INFLUENZA VACCINE  Completed  . COVID-19 Vaccine  Completed  . Hepatitis C Screening  Completed   . HIV Screening  Completed  . HPV VACCINES  Aged Out     Immunization History  Administered Date(s) Administered  . DTaP 04/04/2001, 04/24/2008  . Hepatitis A, Adult 02/04/2008  . Hepatitis B, adult 03/06/2008, 08/04/2008  . Influenza Inj Mdck Quad  02/21/2017  . Influenza Split 02/16/2016, 03/02/2016, 02/22/2017  . Influenza Whole 01/09/2013  . Influenza,inj,Quad  04/07/2019, 04/06/2020  . Influenza,inj,quad,  02/17/2016  . Influenza 05/21/2007, 01/20/2008, 01/23/2012, 02/16/2015  . PFIZER  SARS-COV-2 Vacc 07/04/2019, 07/25/2019, 03/11/2020  . PPD Test 02/21/2017, 04/02/2018, 05/07/2019, 05/26/2020  . Pneumococcal -23 04/27/2016, 02/21/2017  . Td 10/30/2002  . Tdap 11/18/2007, 07/17/2013    Last Colon - 08/27/2013 - Dr Delfin Edis - recc 10 yr  F/u due Oct 2025   Past Surgical History:  Procedure Laterality Date  . A-FLUTTER ABLATION N/A 02/01/2021   Procedure: A-FLUTTER Johnette Abraham, MD  . 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 CERVICAL FUSION  1991  . ESOPHAGOGASTRODUODENOSCOPY  1987,1992,1993,1995   duodenal ulcer  . LUMBAR DISC SURGERY    . LUMBAR FUSION  2001  . LUMBAR LAMINECTOMY  7/97     Family History  Problem Relation Age of Onset  . Hypertension Mother   . Stroke Mother   . Cancer Mother        ovarian  . Alcohol abuse Father   . Hypertension Father   . Clotting disorder Father        antiphospholipid syndrome   . Diabetes Maternal Uncle   . Cancer Maternal Grandmother        lung  . Cancer Paternal Grandfather        lung  . Colon cancer Neg Hx   . Stomach cancer Neg Hx      Social History   Tobacco Use  . Smoking status: Every Day    Packs/day: 0.25    Types: Cigarettes    Start date: 50  . Smokeless tobacco: Never  . Tobacco comments:    Currently down to smoking 0.5 pack   Vaping Use  . Vaping Use: Never used  Substance Use Topics  . Alcohol use: Yes    Alcohol/week: 4.0 standard drinks    Types: 4 Standard drinks  or equivalent per week    Comment: socially- 2-3 drinks weekly  . Drug use:  No      ROS Constitutional: Denies fever, chills, weight loss/gain, headaches, insomnia,  night sweats or change in appetite. Does c/o fatigue. Eyes: Denies redness, blurred vision, diplopia, discharge, itchy or watery eyes.  ENT: Denies discharge, congestion, post nasal drip, epistaxis, sore throat, earache, hearing loss, dental pain, Tinnitus, Vertigo, Sinus pain or snoring.  Cardio: Denies chest pain, palpitations, irregular heartbeat, syncope, dyspnea, diaphoresis, orthopnea, PND, claudication or edema Respiratory: denies cough, dyspnea, DOE, pleurisy, hoarseness, laryngitis or wheezing.  Gastrointestinal: Denies dysphagia, heartburn, reflux, water brash, pain, cramps, nausea, vomiting, bloating, diarrhea, constipation, hematemesis, melena, hematochezia, jaundice or hemorrhoids Genitourinary: Denies dysuria, frequency, urgency, nocturia, hesitancy, discharge, hematuria or flank pain Musculoskeletal: Denies arthralgia, myalgia, stiffness, Jt. Swelling, pain, limp or strain/sprain. Denies Falls. Skin: Denies puritis, rash, hives, warts, acne, eczema or change in skin lesion Neuro: No weakness, tremor, incoordination, spasms, paresthesia or pain Psychiatric: Denies confusion, memory loss or sensory loss. Denies Depression. Endocrine: Denies change in weight, skin, hair change, nocturia, and paresthesia, diabetic polys, visual blurring or hyper / hypo glycemic episodes.  Heme/Lymph: No excessive bleeding, bruising or enlarged lymph nodes.   Physical Exam  There were no vitals taken for this visit.  General Appearance: Well nourished and well groomed and in no apparent distress.  Eyes: PERRLA, EOMs, conjunctiva no swelling or erythema, normal fundi and vessels. Sinuses: No frontal/maxillary tenderness ENT/Mouth: EACs patent / TMs  nl. Nares clear without erythema, swelling, mucoid exudates. Oral hygiene is good.  No erythema, swelling, or exudate. Tongue normal, non-obstructing. Tonsils not swollen or erythematous. Hearing normal.  Neck: Supple, thyroid not palpable. No bruits, nodes or JVD. Respiratory: Respiratory effort normal.  BS equal and clear bilateral without rales, rhonci, wheezing or stridor. Cardio: Heart sounds are normal with regular rate and rhythm and no murmurs, rubs or gallops. Peripheral pulses are normal and equal bilaterally without edema. No aortic or femoral bruits. Chest: symmetric with normal excursions and percussion.  Abdomen: Soft, with Nl bowel sounds. Nontender, no guarding, rebound, hernias, masses, or organomegaly.  Lymphatics: Non tender without lymphadenopathy.  Musculoskeletal: Full ROM all peripheral extremities, joint stability, 5/5 strength, and normal gait. Skin: Warm and dry without rashes, lesions, cyanosis, clubbing or  ecchymosis.  Neuro: Cranial nerves intact, reflexes equal bilaterally. Normal muscle tone, no cerebellar symptoms. Sensation intact.  Pysch: Alert and oriented X 3 with normal affect, insight and judgment appropriate.   Assessment and Plan  1. Annual Preventative/Screening Exam   1. Encounter for general adult medical examination with abnormal findings   2. Essential hypertension  - EKG 12-Lead - [place], RETROPERITNL ABD,  LTD - Urinalysis, Routine w reflex microscopic - Microalbumin / creatinine urine ratio - CBC with Differential/Platelet - COMPLETE METABOLIC PANEL WITH GFR - Magnesium - TSH  3. Hyperlipidemia associated with type 2 diabetes mellitus ([place])  - EKG 12-Lead - [place], RETROPERITNL ABD,  LTD - Lipid panel - Hemoglobin A1c - Insulin, random  4. Type 2 diabetes mellitus with stage 2 chronic kidney  disease, without long-term current use of insulin ([facility])  - EKG 12-Lead - [place], RETROPERITNL ABD,  LTD - Urinalysis, Routine w reflex microscopic - Microalbumin / creatinine urine ratio - HM DIABETES FOOT EXAM - PR LOW EXTEMITY  NEUR EXAM DOCUM - Hemoglobin A1c - Insulin, random  5. Vitamin D deficiency  - VITAMIN D 25 Hydroxy   6. Hypothyroidism  - TSH  7. DDD (degenerative disc disease), lumbar   8. Testosterone deficiency  - Testosterone  9. Attention  deficit disorder   10. Prostate cancer screening  - PSA  11. Screening for colorectal cancer  - POC Hemoccult Bld/Stl (3-Cd Home Screen); Future  12. Screening examination for pulmonary tuberculosis  - TB Skin Test  13. Screening for ischemic heart disease  - EKG 12-Lead  14. FHx: heart disease  - EKG 12-Lead - [place], RETROPERITNL ABD,  LTD  15. Smoker  - EKG 12-Lead - [place], RETROPERITNL ABD,  LTD  16. Screening for AAA (aortic abdominal aneurysm)  - [place], RETROPERITNL ABD,  LTD  17. Fatigue, unspecified type  - Iron, Total/Total Iron Binding Cap - Vitamin B12 - Testosterone - CBC with Differential/Platelet - TSH  18. Medication management  - Urinalysis, Routine w reflex microscopic - Microalbumin / creatinine urine ratio - CBC with Differential/Platelet - COMPLETE METABOLIC PANEL WITH GFR - Magnesium - Lipid panel - TSH - Hemoglobin A1c - Insulin, random - VITAMIN D 25 Hydroxy           Patient was counseled in prudent diet, weight control to achieve/maintain BMI less than 25, BP monitoring, regular exercise and medications as discussed.  Discussed med effects and SE's. Routine screening labs and tests as requested with regular follow-up as recommended. Over 40 minutes of exam, counseling, chart review and high complex critical decision making was performed   Kirtland Bouchard, MD.

## 2022-07-16 NOTE — Progress Notes (Signed)
Annual  Screening/Preventative Visit  & Comprehensive Evaluation & Examination  Future Appointments  Date Time Provider Department  07/17/2022  3:00 PM Lucky Cowboy, MD [facility]  07/25/2023  3:00 PM Lucky Cowboy, MD [facility]             This very nice 62 y.o. single WM with  HTN, HLD, T2_NIDDM, Hypothyroidism, Testosterone  and Vitamin D Deficiency presents for a Screening /Preventative Visit & comprehensive evaluation and management of multiple medical co-morbidities.   Patient also is on Dexadrine for Adult ADD  &  reports improved focus & concentration.       Due to his long history of smoking over 35+ pk years,  I discussed lung cancer screening with him . He was agreeable to undergo a screening low dose CT scan of the chest.  We discussed smoking cessation techniques/options.  I will refer him her for a LDCT lung scan.        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         HTN predates since  2014. Patient's BP has been controlled at home.  Today's BP  is not  at goal - 150/68.   Last Aug 2022 , patient was discovered in Atrial Flutter and underwent ablation in October 2022  by Dr Johney Frame.  Patient denies any cardiac symptoms as chest pain, palpitations, shortness of breath, dizziness or ankle swelling.        Patient's hyperlipidemia is controlled with diet . Last lipids were at goal :  Lab Results  Component Value Date   CHOL 91 03/28/2022   HDL 34 (L) 03/28/2022   LDLCALC 34 03/28/2022   TRIG 152 (H) 03/28/2022   CHOLHDL 2.7 03/28/2022         Patient has hx/o T2_NIDDM since  2013  and  is on Metformin .   Patient denies reactive hypoglycemic symptoms, visual blurring, diabetic polys or paresthesias. Last A1c was not at goal :   Lab Results  Component Value Date   HGBA1C 7.5 (H) 03/28/2022           Patient  was dx'd Hypothyroid in 2015 has been on Thyroid Replacement since. Patient also is on Depo-Testosterone for Testosterone Deficiency &  reports  improved stamina & sense of well being       Finally, patient has history of Vitamin D Deficiency ("33" /2013) and last vitamin D was at goal :   Lab Results  Component Value Date   VD25OH 66 12/12/2021      Current Outpatient Medications  Medication Instructions   acetaminophen 650 mg Every 8 hours PRN   acetaminophen 1,000 mg  Every 6 hours PRN   VITAMIN C1,000 mg Daily   FLEXERIL10 mg  Daily PRN   levothyroxine   50 mcg Daily before breakfast   losartan 100 MG tablet Take  1 tablet  Daily     metFORMIN-XR   1,500 mg Daily   metoprolol succinate  XL 25 mg  Daily   omeprazole 20 mg Daily   rosuvastatin   10 mg Daily   tamsulosin .4 mg, Daily   traMADol 50 mg Every 6 hours PRN   varenicline 1 MG tablet Take 1 tablet 2 x /day for tobacco cessation   Vitamin D  5,000 Units Daily   zinc 50  mg Daily     No Known Allergies   Past Medical History:  Diagnosis Date   COPD (chronic obstructive pulmonary disease) ([facility])    DDD (degenerative disc disease)    Depression 10/09/2013   Diabetes mellitus without complication ([facility])    GERD (gastroesophageal reflux disease)    Heart murmur    1971- per pt no issues reported by cardiology about murmur- Echo done   History of kidney stones    Hyperlipidemia    Hypertension    Hypogonadism male    Hypothyroidism    Typical atrial flutter ([facility]) 2022   Vitamin D deficiency      Health Maintenance  Topic Date Due   Zoster Vaccines- Shingrix (1 of 2) Never done   FOOT EXAM  05/24/2021   HEMOGLOBIN A1C  09/11/2021   OPHTHALMOLOGY EXAM  04/13/2022   TETANUS/TDAP  07/18/2023   COLONOSCOPY  08/28/2023   INFLUENZA VACCINE  Completed   COVID-19 Vaccine  Completed   Hepatitis C Screening  Completed   HIV Screening  Completed   HPV VACCINES  Aged Out     Immunization History   Administered Date(s) Administered   DTaP 04/04/2001, 04/24/2008   Hepatitis A, Adult 02/04/2008   Hepatitis B, adult 03/06/2008, 08/04/2008   Influenza Inj Mdck Quad  02/21/2017   Influenza Split 02/16/2016, 03/02/2016, 02/22/2017   Influenza Whole 01/09/2013   Influenza,inj,Quad  04/07/2019, 04/06/2020   Influenza,inj,quad,  02/17/2016   Influenza 05/21/2007, 01/20/2008, 01/23/2012, 02/16/2015   PFIZER  SARS-COV-2 Vacc 07/04/2019, 07/25/2019, 03/11/2020   PPD Test 02/21/2017, 04/02/2018, 05/07/2019, 05/26/2020   Pneumococcal -23 04/27/2016, 02/21/2017   Td 10/30/2002   Tdap 11/18/2007, 07/17/2013    Colon - 08/27/2013 - Dr Lina Sar    Last Colon 08/26/2021 - Dr Rhea Belton - Recc 7 year f/u due  May 2030   Past Surgical History:  Procedure Laterality Date   A-FLUTTER ABLATION N/A 02/01/2021   Procedure: A-FLUTTER Lavone Orn, MD   ASD REPAIR  1971   open heart   CERVICAL FUSION  1991   ESOPHAGOGASTRODUODENOSCOPY  [phone]   duodenal ulcer   LUMBAR DISC SURGERY     LUMBAR FUSION  2001   LUMBAR LAMINECTOMY  7/97     Family History  Problem Relation Age of Onset   Hypertension Mother    Stroke Mother    Cancer Mother        ovarian   Alcohol abuse Father    Hypertension Father    Clotting disorder Father        antiphospholipid syndrome    Diabetes Maternal Uncle    Cancer Maternal Grandmother        lung   Cancer Paternal Grandfather        lung   Colon cancer Neg Hx    Stomach cancer Neg Hx      Social History   Tobacco Use   Smoking status: Every Day    Packs/day: 0.25    Types: Cigarettes    Start date: 1980   Smokeless tobacco: Never   Tobacco comments:    Currently down to smoking 0.5 pack   Vaping Use   Vaping Use: Never used  Substance Use Topics   Alcohol use: Yes    Alcohol/week: 4.0 standard drinks    Types: 4 Standard drinks or equivalent per week    Comment: socially- 2-3 drinks weekly   Drug use: No       ROS Constitutional: Denies fever, chills,  weight loss/gain, headaches, insomnia,  night sweats or change in appetite. Does c/o fatigue. Eyes: Denies redness, blurred vision, diplopia, discharge, itchy or watery eyes.  ENT: Denies discharge, congestion, post nasal drip, epistaxis, sore throat, earache, hearing loss, dental pain, Tinnitus, Vertigo, Sinus pain or snoring.  Cardio: Denies chest pain, palpitations, irregular heartbeat, syncope, dyspnea, diaphoresis, orthopnea, PND, claudication or edema Respiratory: denies cough, dyspnea, DOE, pleurisy, hoarseness, laryngitis or wheezing.  Gastrointestinal: Denies dysphagia, heartburn, reflux, water brash, pain, cramps, nausea, vomiting, bloating, diarrhea, constipation, hematemesis, melena, hematochezia, jaundice or hemorrhoids Genitourinary: Denies dysuria, frequency, urgency, nocturia, hesitancy, discharge, hematuria or flank pain Musculoskeletal: Denies arthralgia, myalgia, stiffness, Jt. Swelling, pain, limp or strain/sprain. Denies Falls. Skin: Denies puritis, rash, hives, warts, acne, eczema or change in skin lesion Neuro: No weakness, tremor, incoordination, spasms, paresthesia or pain Psychiatric: Denies confusion, memory loss or sensory loss. Denies Depression. Endocrine: Denies change in weight, skin, hair change, nocturia, and paresthesia, diabetic polys, visual blurring or hyper / hypo glycemic episodes.  Heme/Lymph: No excessive bleeding, bruising or enlarged lymph nodes.   Physical Exam  BP (!) 150/68   Pulse 82   Temp 97.8 F (36.6 C)   Resp 16   Ht 5\' 10"  (1.778 m)   Wt 195 lb 3.2 oz (88.5 kg)   SpO2 96%   BMI 28.01 kg/m   General Appearance: Well nourished and well groomed and in no apparent distress.  Eyes: PERRLA, EOMs, conjunctiva no swelling or erythema, normal fundi and vessels. Sinuses: No frontal/maxillary tenderness ENT/Mouth: EACs patent / TMs  nl. Nares clear without erythema, swelling, mucoid exudates. Oral  hygiene is good. No erythema, swelling, or exudate. Tongue normal, non-obstructing. Tonsils not swollen or erythematous. Hearing normal.  Neck: Supple, thyroid not palpable. No bruits, nodes or JVD. Respiratory: Respiratory effort normal.  BS equal and clear bilateral without rales, rhonci, wheezing or stridor. Cardio: Heart sounds are normal with regular rate and rhythm and no murmurs, rubs or gallops. Peripheral pulses are normal and equal bilaterally without edema. No aortic or femoral bruits. Chest: symmetric with normal excursions and percussion.  Abdomen: Soft, with Nl bowel sounds. Nontender, no guarding, rebound, hernias, masses, or organomegaly.  Lymphatics: Non tender without lymphadenopathy.  Musculoskeletal: Full ROM all peripheral extremities, joint stability, 5/5 strength, and normal gait. Skin: Warm and dry without rashes, lesions, cyanosis, clubbing or  ecchymosis.  Neuro: Cranial nerves intact, reflexes equal bilaterally. Normal muscle tone, no cerebellar symptoms. Sensation intact.  Pysch: Alert and oriented X 3 with normal affect, insight and judgment appropriate.   Assessment and Plan  1. Annual Preventative/Screening Exam    2. Essential hypertension  - Increase new Rx gToprol 100 mg XL & monitor BPs.   - EKG 12-Lead - [place], RETROPERITNL ABD,  LTD - Urinalysis, Routine w reflex microscopic - Microalbumin / creatinine urine ratio - CBC with Differential/Platelet - COMPLETE METABOLIC PANEL WITH GFR - Magnesium - TSH  3. Hyperlipidemia associated with type 2 diabetes mellitus ([facility])  - EKG 12-Lead - [place], RETROPERITNL ABD,  LTD - Lipid panel - TSH  4. Type 2 diabetes mellitus with stage 2 chronic kidney  disease, without long-term current use of insulin ([facility])  - EKG 12-Lead - [place], RETROPERITNL ABD,  LTD - HM DIABETES FOOT EXAM - PR LOW EXTEMITY NEUR EXAM DOCUM - Hemoglobin A1c - Insulin, random  5. Vitamin D deficiency  - VITAMIN D 25 Hydroxy   6.  Hypothyroidism, unspecified type  - TSH  7. Attention deficit disorder  8. DDD (degenerative disc disease), lumbar   9. Testosterone deficiency  - Testosterone  10. Screening for colorectal cancer  - POC Hemoccult Bld/Stl   11. Prostate cancer screening  - PSA  12. Screening examination for pulmonary tuberculosis  - TB Skin Test  13. Screening for heart disease  - EKG 12-Lead  14. Smoker  - EKG 12-Lead - [place], RETROPERITNL ABD,  LTD - CT CHEST LUNG CANCER SCREENING LOW DOSE WO CONTRAST; Future  15. FHx: heart disease  - EKG 12-Lead - [place], RETROPERITNL ABD,  LTD  16. Screening for lung cancer  - CT CHEST LUNG CANCER SCREENING LOW DOSE WO CONTRAST; Future  17. Screening for AAA (aortic abdominal aneurysm)  - [place], RETROPERITNL ABD,  LTD  18. Fatigue   19. Medication management  - Urinalysis, Routine w reflex microscopic - Microalbumin / creatinine urine ratio - CBC with Differential/Platelet - COMPLETE METABOLIC PANEL WITH GFR - Magnesium - Lipid panel - TSH - Hemoglobin A1c - Insulin, random - VITAMIN D 25 Hydroxy           Patient was counseled in prudent diet, weight control to achieve/maintain BMI less than 25, BP monitoring, regular exercise and medications as discussed.  Discussed med effects and SE's. Routine screening labs and tests as requested with regular follow-up as recommended. Over 40 minutes of exam, counseling, chart review and high complex critical decision making was performed   Paul Maw, MD.

## 2022-07-17 ENCOUNTER — Encounter: Payer: Self-pay | Admitting: Internal Medicine

## 2022-07-17 ENCOUNTER — Ambulatory Visit (INDEPENDENT_AMBULATORY_CARE_PROVIDER_SITE_OTHER): Payer: 59 | Admitting: Internal Medicine

## 2022-07-17 VITALS — BP 150/68 | HR 82 | Temp 97.8°F | Resp 16 | Ht 70.0 in | Wt 195.2 lb

## 2022-07-17 DIAGNOSIS — E1169 Type 2 diabetes mellitus with other specified complication: Secondary | ICD-10-CM

## 2022-07-17 DIAGNOSIS — Z0000 Encounter for general adult medical examination without abnormal findings: Secondary | ICD-10-CM | POA: Diagnosis not present

## 2022-07-17 DIAGNOSIS — Z1211 Encounter for screening for malignant neoplasm of colon: Secondary | ICD-10-CM

## 2022-07-17 DIAGNOSIS — M51369 Other intervertebral disc degeneration, lumbar region without mention of lumbar back pain or lower extremity pain: Secondary | ICD-10-CM

## 2022-07-17 DIAGNOSIS — Z8249 Family history of ischemic heart disease and other diseases of the circulatory system: Secondary | ICD-10-CM

## 2022-07-17 DIAGNOSIS — Z0001 Encounter for general adult medical examination with abnormal findings: Secondary | ICD-10-CM

## 2022-07-17 DIAGNOSIS — Z79899 Other long term (current) drug therapy: Secondary | ICD-10-CM

## 2022-07-17 DIAGNOSIS — E039 Hypothyroidism, unspecified: Secondary | ICD-10-CM

## 2022-07-17 DIAGNOSIS — Z111 Encounter for screening for respiratory tuberculosis: Secondary | ICD-10-CM

## 2022-07-17 DIAGNOSIS — F988 Other specified behavioral and emotional disorders with onset usually occurring in childhood and adolescence: Secondary | ICD-10-CM

## 2022-07-17 DIAGNOSIS — Z136 Encounter for screening for cardiovascular disorders: Secondary | ICD-10-CM

## 2022-07-17 DIAGNOSIS — I10 Essential (primary) hypertension: Secondary | ICD-10-CM

## 2022-07-17 DIAGNOSIS — I491 Atrial premature depolarization: Secondary | ICD-10-CM

## 2022-07-17 DIAGNOSIS — R5383 Other fatigue: Secondary | ICD-10-CM

## 2022-07-17 DIAGNOSIS — Z122 Encounter for screening for malignant neoplasm of respiratory organs: Secondary | ICD-10-CM

## 2022-07-17 DIAGNOSIS — E559 Vitamin D deficiency, unspecified: Secondary | ICD-10-CM

## 2022-07-17 DIAGNOSIS — E1122 Type 2 diabetes mellitus with diabetic chronic kidney disease: Secondary | ICD-10-CM

## 2022-07-17 DIAGNOSIS — F17200 Nicotine dependence, unspecified, uncomplicated: Secondary | ICD-10-CM

## 2022-07-17 DIAGNOSIS — M5136 Other intervertebral disc degeneration, lumbar region: Secondary | ICD-10-CM

## 2022-07-17 DIAGNOSIS — Z125 Encounter for screening for malignant neoplasm of prostate: Secondary | ICD-10-CM

## 2022-07-17 DIAGNOSIS — E349 Endocrine disorder, unspecified: Secondary | ICD-10-CM

## 2022-07-17 MED ORDER — METOPROLOL SUCCINATE ER 100 MG PO TB24: ORAL_TABLET | ORAL | 3 refills | Status: DC

## 2022-07-17 NOTE — Patient Instructions (Signed)

## 2022-07-18 LAB — LIPID PANEL
Cholesterol: 81 mg/dL (ref <200)
HDL: 34 mg/dL — ABNORMAL LOW (ref >=40)
LDL Cholesterol (Calc): 20 mg/dL (calc)
Non-HDL Cholesterol (Calc): 47 mg/dL (calc) (ref <130)
Total CHOL/HDL Ratio: 2.4 (calc) (ref <5.0)
Triglycerides: 196 mg/dL — ABNORMAL HIGH (ref <150)

## 2022-07-18 LAB — CBC WITH DIFFERENTIAL/PLATELET
Absolute Monocytes: 657 cells/uL (ref 200–950)
Basophils Absolute: 27 cells/uL (ref 0–200)
Basophils Relative: 0.3 %
Eosinophils Absolute: 72 cells/uL (ref 15–500)
Eosinophils Relative: 0.8 %
HCT: 42.5 % (ref 38.5–50.0)
Hemoglobin: 14 g/dL (ref 13.2–17.1)
Lymphs Abs: 2439 cells/uL (ref 850–3900)
MCH: 29.5 pg (ref 27.0–33.0)
MCHC: 32.9 g/dL (ref 32.0–36.0)
MCV: 89.7 fL (ref 80.0–100.0)
MPV: 11.9 fL (ref 7.5–12.5)
Monocytes Relative: 7.3 %
Neutro Abs: 5805 cells/uL (ref 1500–7800)
Neutrophils Relative %: 64.5 %
Platelets: 241 10*3/uL (ref 140–400)
RBC: 4.74 10*6/uL (ref 4.20–5.80)
RDW: 12.7 % (ref 11.0–15.0)
Total Lymphocyte: 27.1 %
WBC: 9 10*3/uL (ref 3.8–10.8)

## 2022-07-18 LAB — MICROALBUMIN / CREATININE URINE RATIO
Creatinine, Urine: 183 mg/dL (ref 20–320)
Microalb Creat Ratio: 22 mg/g creat (ref <30)
Microalb, Ur: 4.1 mg/dL

## 2022-07-18 LAB — URINALYSIS, ROUTINE W REFLEX MICROSCOPIC
Bacteria, UA: NONE SEEN /HPF
Bilirubin Urine: NEGATIVE
Glucose, UA: NEGATIVE
Hgb urine dipstick: NEGATIVE
Hyaline Cast: NONE SEEN /LPF
Ketones, ur: NEGATIVE
Leukocytes,Ua: NEGATIVE
Nitrite: NEGATIVE
RBC / HPF: NONE SEEN /HPF (ref 0–2)
Specific Gravity, Urine: 1.032 (ref 1.001–1.035)
Squamous Epithelial / HPF: NONE SEEN /HPF (ref <=5)
WBC, UA: NONE SEEN /HPF (ref 0–5)
pH: 5.5 (ref 5.0–8.0)

## 2022-07-18 LAB — COMPLETE METABOLIC PANEL WITH GFR
AG Ratio: 1.8 (calc) (ref 1.0–2.5)
ALT: 42 U/L (ref 9–46)
AST: 22 U/L (ref 10–35)
Albumin: 4.5 g/dL (ref 3.6–5.1)
Alkaline phosphatase (APISO): 80 U/L (ref 35–144)
BUN: 17 mg/dL (ref 7–25)
CO2: 22 mmol/L (ref 20–32)
Calcium: 9.7 mg/dL (ref 8.6–10.3)
Chloride: 101 mmol/L (ref 98–110)
Creat: 0.96 mg/dL (ref 0.70–1.30)
Globulin: 2.5 g/dL (calc) (ref 1.9–3.7)
Glucose, Bld: 206 mg/dL — ABNORMAL HIGH (ref 65–99)
Potassium: 4.6 mmol/L (ref 3.5–5.3)
Sodium: 139 mmol/L (ref 135–146)
Total Bilirubin: 0.3 mg/dL (ref 0.2–1.2)
Total Protein: 7 g/dL (ref 6.1–8.1)
eGFR: 91 mL/min/{1.73_m2} (ref >=60)

## 2022-07-18 LAB — TSH: TSH: 1.21 mIU/L (ref 0.40–4.50)

## 2022-07-18 LAB — HEMOGLOBIN A1C
Hgb A1c MFr Bld: 7.8 % of total Hgb — ABNORMAL HIGH (ref <5.7)
Mean Plasma Glucose: 177 mg/dL
eAG (mmol/L): 9.8 mmol/L

## 2022-07-18 LAB — MICROSCOPIC MESSAGE

## 2022-07-18 LAB — PSA: PSA: 0.54 ng/mL (ref <=4.00)

## 2022-07-18 LAB — MAGNESIUM: Magnesium: 1.7 mg/dL (ref 1.5–2.5)

## 2022-07-18 LAB — VITAMIN D 25 HYDROXY (VIT D DEFICIENCY, FRACTURES): Vit D, 25-Hydroxy: 79 ng/mL (ref 30–100)

## 2022-07-18 LAB — INSULIN, RANDOM: Insulin: 109.2 u[IU]/mL — ABNORMAL HIGH

## 2022-07-18 LAB — TESTOSTERONE: Testosterone: 73 ng/dL — ABNORMAL LOW (ref 250–827)

## 2022-07-19 ENCOUNTER — Other Ambulatory Visit: Payer: Self-pay | Admitting: Nurse Practitioner

## 2022-07-19 DIAGNOSIS — E1122 Type 2 diabetes mellitus with diabetic chronic kidney disease: Secondary | ICD-10-CM

## 2022-07-19 NOTE — Progress Notes (Signed)
<><><><><><><><><><><><><><><><><><><><><><><><><><><><><><><><><> <><><><><><><><><><><><><><><><><><><><><><><><><><><><><><><><><> - Test results slightly outside the reference range are not unusual. If there is anything important, I will review this with you,  otherwise it is considered normal test values.  If you have further questions,  please do not hesitate to contact me at the office or via My Chart.  <><><><><><><><><><><><><><><><><><><><><><><><><><><><><><><><><> <><><><><><><><><><><><><><><><><><><><><><><><><><><><><><><><><>  -  Testosterone = 73 - is very low  ==================================================================  9 Ways to Naturally Increase Testosterone Levels -------------------------------------------------------------------------------------------------------------------- 1.   Lose Weight  If you're overweight, shedding the excess pounds may increase your testosterone levels, according to research presented at the Endocrine Society's 2012 meeting. Overweight men are more likely to have low testosterone levels to begin with, so this is an important trick to increase your body's testosterone production when you need it most.  2.   High-Intensity Exercise like Peak Fitness   Short intense exercise has a proven positive effect on increasing testosterone levels and preventing its decline. That's unlike aerobics or prolonged moderate exercise, which have shown to have negative or no effect on testosterone levels. Having a whey protein meal after exercise can further enhance the satiety/testosterone-boosting impact (hunger hormones cause the opposite effect on your testosterone and libido). Here's a summary of what a typical high-intensity Peak Fitness routine might look like: " Warm up for three minutes  " Exercise as hard and fast as you can for 30 seconds. You should feel like you couldn't possibly go on another few seconds  " Recover at a slow to moderate  pace for 90 seconds  " Repeat the high intensity exercise and recovery 7 more times .  3.   Consume Plenty of Zinc  The mineral zinc is important for testosterone production, and supplementing your diet for as little as six weeks has been shown to cause a marked improvement in testosterone among men with low levels.1 Likewise, research has shown that restricting dietary sources of zinc leads to a significant decrease in testosterone, while zinc supplementation increases it - and even protects men from exercised-induced reductions in testosterone levels.  It's estimated that up to 19 percent of adults over the age of 60 may have lower than recommended zinc intakes; even when dietary supplements were added in, an estimated 20-25 percent of older adults still had inadequate zinc intakes, according to a [employer] and Nutrition Examination Survey.4 Your diet is the best source of zinc; along with protein-rich foods like  fish, other good dietary sources of zinc include raw milk, raw cheese, beans, and yogurt or kefir made from raw milk. It can be difficult to obtain enough dietary zinc if you're a vegetarian, and also for meat-eaters as well, largely because of conventional farming methods that rely heavily on chemical fertilizers and pesticides. These chemicals deplete the soil of nutrients ... nutrients like zinc that must be absorbed by plants in order to be passed on to you. In many cases, you may further deplete the nutrients in your food by the way you prepare it. For most food, cooking it will drastically reduce its levels of nutrients like zinc ... particularly over-cooking, which many people do. If you decide to use a zinc supplement, stick to a dosage of 50 mg a day, as this is the recommended adult dose. Taking too much zinc can interfere with your body's ability to absorb other minerals, especially copper, and may cause nausea as a side effect.  4.   Strength Training  In addition to Peak  Fitness, strength training is also known to  boost testosterone levels, provided you are doing so intensely enough. When strength training to boost testosterone, you'll want to increase the weight and lower your number of reps, and then focus on exercises that work a large number of muscles, such as dead lifts or squats.  You can "turbo-charge" your weight training by going slower. By slowing down your movement, you're actually turning it into a high-intensity exercise. Super Slow movement allows your muscle, at the microscopic level, to access the maximum number of cross-bridges between the protein filaments that produce movement in the muscle.   5.   Optimize Your Vitamin D Levels  Vitamin D, a steroid hormone, is essential for the healthy development of the nucleus of the sperm cell, and helps maintain semen quality and sperm count. Vitamin D also increases levels of testosterone, which may boost libido. In one study, overweight men who were given vitamin D supplements had a significant increase in testosterone levels after one year.5   6.   Reduce Stress  When you're under a lot of stress, your body releases high levels of the stress hormone cortisol. This hormone actually blocks the effects of testosterone,6 presumably because, from a biological standpoint, testosterone-associated behaviors (mating, competing, aggression) may have lowered your chances of survival in an emergency (hence, the "fight or flight" response is dominant, courtesy of cortisol).  7.   Limit or Eliminate Sugar from Your Diet  Testosterone levels decrease after you eat sugar, which is likely because the sugar leads to a high insulin level, another factor leading to low testosterone.7 Based on USDA estimates, the average American consumes 12 teaspoons of sugar a day, which equates to about TWO TONS of sugar during a lifetime.  8.   Eat Healthy Fats  By healthy, this means not only mono- and polyunsaturated fats, like that  found in avocadoes and nuts, but also saturated, as these are essential for building testosterone. Research shows that a diet with less than 40 percent of energy as fat (and that mainly from animal sources, i.e. saturated) lead to a decrease in testosterone levels. ie eat less animal products - as Meat , poultry and dairy. Experts agree that the ideal diet includes somewhere between 50-70 percent fat.  It's important to understand that your body requires saturated fats from animal and vegetable sources (such as meat, dairy, certain oils, and tropical plants like coconut) for optimal functioning, and if you neglect this important food group in favor of sugar, grains and other starchy carbs, your health and weight are almost guaranteed to suffer. Examples of healthy fats you can eat more of to give your testosterone levels a boost include: Olives and Olive oil  Coconuts and coconut oil Butter made from raw grass-fed organic milk Raw nuts, such as, almonds or pecans Organic pastured egg yolks Avocados Grass-fed meats Palm oil Unheated organic nut oils  9.   Boost Your Intake of Branch Chain Amino Acids (BCAA) from Foods Like [place] suggests that BCAAs result in higher testosterone levels, particularly when taken along with resistance training. While BCAAs are available in supplement form, you'll find the highest concentrations of BCAAs like leucine in whey protein. Even when getting leucine from your natural food supply, it's often wasted or used as a building block instead of an anabolic agent. So to create the correct anabolic environment, you need to boost leucine consumption way beyond mere maintenance levels. That said, keep in mind that using leucine as a free form amino acid can be  highly counterproductive as when free form amino acids are artificially administrated, they rapidly enter your circulation while disrupting insulin function, and impairing your body's glycemic control.  Food-based leucine is really the ideal form that can benefit your muscles without side effects.  <><><><><><><><><><><><><><><><><><><><><><><><><><><><><><><><><> <><><><><><><><><><><><><><><><><><><><><><><><><><><><><><><><><>  -  Glucose =   206 mg%  ( ideal or goal is less than 100 mg%   ! )    &              12 week average glucose = A1c worse = 7.8%                                                                     ( Ideal or goal  is less than 5.7%   !  )   Diet is very important    - Being diabetic has a  300% increased risk for heart attack,                                                  stroke, cancer, and alzheimer- type vascular dementia.   It is very important that you work harder with diet by                                  avoiding all foods that are white except chicken, fish & calliflower.  - Avoid white rice  (brown & wild rice is OK),   - Avoid white potatoes  (sweet potatoes in moderation is OK),   White bread or wheat bread or anything made out of   white flour like bagels, donuts, rolls, buns, biscuits, cakes,  - pastries, cookies, pizza crust, and pasta (made from  white flour & egg whites)   - vegetarian pasta or spinach or wheat pasta is OK.  - Multigrain breads like Arnold's, Pepper[employer] or   multigrain sandwich thins or high fiber breads like   [place] bread or "Dave's Killer" breads that are  4 to 5 grams fiber per slice !  are best.    Diet, exercise and weight loss can reverse and cure  diabetes in the early stages.    - Diet, exercise and weight loss is very important in the   control and prevention of complications of diabetes which  affects every system in your body, ie.   -Brain - dementia/stroke,  - eyes - glaucoma/blindness,  - heart - heart attack/heart failure,  - kidneys - dialysis,  - stomach - gastric paralysis,  - intestines - malabsorption,  - nerves - severe painful neuritis,  - circulation - gangrene & loss  of a leg(s)  - and finally  . . . . . . . . . . . . . . . . . .    - cancer and Alzheimers. <><><><><><><><><><><><><><><><><><><><><><><><><><><><><><><><><> <><><><><><><><><><><><><><><><><><><><><><><><><><><><><><><><><>  -  Total Chol = 81   &   LDL Chol - Both  Excellent   - Very low risk for Heart Attack  / Stroke <><><><><><><><><><><><><><><><><><><><><><><><><><><><><><><><><> <><><><><><><><><><><><><><><><><><><><><><><><><><><><><><><><><>  -  PSA - Very Low -  No Prostate Cancer  - Great  ! <><><><><><><><><><><><><><><><><><><><><><><><><><><><><><><><><>  -   Magnesium = 1.7  - is  very  low - goal is betw 2.0 - 2.5,   - So.........Marland Kitchen Recommend that you take Magnesium 500 mg tablet  3 x /day with meals   - also important to eat lots of  leafy green vegetables   - spinach - Kale - collards - greens - okra - asparagus - broccoli - quinoa - squash   - almonds - black, red, white beans - peas - green beans <><><><><><><><><><><><><><><><><><><><><><><><><><><><><><><><><> <><><><><><><><><><><><><><><><><><><><><><><><><><><><><><><><><>  -  Vitamin D =  79 - Excellent - Please keep dosing same  <><><><><><><><><><><><><><><><><><><><><><><><><><><><><><><><><>  -  All Else - CBC - Kidneys - Electrolytes - Liver & Thyroid    - all  Normal / OK <><><><><><><><><><><><><><><><><><><><><><><><><><><><><><><><><> <><><><><><><><><><><><><><><><><><><><><><><><><><><><><><><><><>                             -     -

## 2022-07-23 ENCOUNTER — Ambulatory Visit (HOSPITAL_BASED_OUTPATIENT_CLINIC_OR_DEPARTMENT_OTHER)
Admission: RE | Admit: 2022-07-23 | Discharge: 2022-07-23 | Disposition: A | Discharge status: Home / Self Care | Payer: 59 | Source: Ambulatory Visit | Attending: Internal Medicine | Admitting: Internal Medicine

## 2022-07-23 DIAGNOSIS — F17200 Nicotine dependence, unspecified, uncomplicated: Secondary | ICD-10-CM | POA: Diagnosis not present

## 2022-07-23 DIAGNOSIS — Z122 Encounter for screening for malignant neoplasm of respiratory organs: Secondary | ICD-10-CM

## 2022-07-24 NOTE — Progress Notes (Signed)
<><><><><><><><><><><><><><><><><><><><><><><><><><><><><><><><><> <><><><><><><><><><><><><><><><><><><><><><><><><><><><><><><><><>  -   Chest CT scan - OK - No Sign of Cancer   - Radiologist recommends continue Annual Follow-Up   <><><><><><><><><><><><><><><><><><><><><><><><><><><><><><><><><> <><><><><><><><><><><><><><><><><><><><><><><><><><><><><><><><><>

## 2022-08-07 ENCOUNTER — Other Ambulatory Visit: Payer: Self-pay | Admitting: Internal Medicine

## 2022-08-07 DIAGNOSIS — E1122 Type 2 diabetes mellitus with diabetic chronic kidney disease: Secondary | ICD-10-CM

## 2022-08-07 MED ORDER — TIRZEPATIDE 2.5 MG/0.5ML ~~LOC~~ SOAJ: SUBCUTANEOUS | 0 refills | Status: DC

## 2022-08-16 ENCOUNTER — Other Ambulatory Visit: Payer: Self-pay | Admitting: Nurse Practitioner

## 2022-08-16 DIAGNOSIS — E039 Hypothyroidism, unspecified: Secondary | ICD-10-CM

## 2022-08-24 ENCOUNTER — Other Ambulatory Visit: Payer: Self-pay

## 2022-08-24 DIAGNOSIS — Z1212 Encounter for screening for malignant neoplasm of rectum: Secondary | ICD-10-CM

## 2022-08-24 DIAGNOSIS — Z1211 Encounter for screening for malignant neoplasm of colon: Secondary | ICD-10-CM

## 2022-08-24 LAB — POC HEMOCCULT BLD/STL (HOME/3-CARD/SCREEN)
Card #2 Fecal Occult Blod, POC: NEGATIVE
Card #3 Fecal Occult Blood, POC: NEGATIVE
Fecal Occult Blood, POC: NEGATIVE

## 2022-09-06 ENCOUNTER — Other Ambulatory Visit: Payer: Self-pay | Admitting: Internal Medicine

## 2022-09-06 DIAGNOSIS — E1122 Type 2 diabetes mellitus with diabetic chronic kidney disease: Secondary | ICD-10-CM

## 2022-09-06 MED ORDER — TIRZEPATIDE 5 MG/0.5ML ~~LOC~~ SOAJ: SUBCUTANEOUS | 0 refills | Status: DC

## 2022-10-14 ENCOUNTER — Other Ambulatory Visit: Payer: Self-pay | Admitting: Internal Medicine

## 2022-10-14 MED ORDER — TIRZEPATIDE 7.5 MG/0.5ML ~~LOC~~ SOAJ: SUBCUTANEOUS | 0 refills | Status: DC

## 2022-10-17 ENCOUNTER — Other Ambulatory Visit: Payer: Self-pay | Admitting: Internal Medicine

## 2022-10-19 ENCOUNTER — Encounter: Payer: Self-pay | Admitting: Nurse Practitioner

## 2022-10-19 ENCOUNTER — Ambulatory Visit (INDEPENDENT_AMBULATORY_CARE_PROVIDER_SITE_OTHER): Payer: 59 | Admitting: Nurse Practitioner

## 2022-10-19 VITALS — BP 130/72 | HR 57 | Temp 98.2°F | Ht 70.0 in | Wt 176.4 lb

## 2022-10-19 DIAGNOSIS — E1169 Type 2 diabetes mellitus with other specified complication: Secondary | ICD-10-CM | POA: Diagnosis not present

## 2022-10-19 DIAGNOSIS — E785 Hyperlipidemia, unspecified: Secondary | ICD-10-CM

## 2022-10-19 DIAGNOSIS — E1122 Type 2 diabetes mellitus with diabetic chronic kidney disease: Secondary | ICD-10-CM | POA: Diagnosis not present

## 2022-10-19 DIAGNOSIS — E039 Hypothyroidism, unspecified: Secondary | ICD-10-CM

## 2022-10-19 DIAGNOSIS — Z79899 Other long term (current) drug therapy: Secondary | ICD-10-CM

## 2022-10-19 DIAGNOSIS — I10 Essential (primary) hypertension: Secondary | ICD-10-CM | POA: Diagnosis not present

## 2022-10-19 DIAGNOSIS — F17200 Nicotine dependence, unspecified, uncomplicated: Secondary | ICD-10-CM

## 2022-10-19 DIAGNOSIS — N182 Chronic kidney disease, stage 2 (mild): Secondary | ICD-10-CM

## 2022-10-19 DIAGNOSIS — E663 Overweight: Secondary | ICD-10-CM | POA: Diagnosis not present

## 2022-10-19 DIAGNOSIS — F988 Other specified behavioral and emotional disorders with onset usually occurring in childhood and adolescence: Secondary | ICD-10-CM

## 2022-10-19 DIAGNOSIS — E559 Vitamin D deficiency, unspecified: Secondary | ICD-10-CM

## 2022-10-19 LAB — CBC WITH DIFFERENTIAL/PLATELET
Basophils Relative: 0.4 %
Eosinophils Relative: 1.1 %
Hemoglobin: 12.7 g/dL — ABNORMAL LOW (ref 13.2–17.1)
Total Lymphocyte: 41.5 %

## 2022-10-19 NOTE — Progress Notes (Signed)
FOLLOW UP  Assessment and Plan:   Hypertension Discussed DASH (Dietary Approaches to Stop Hypertension) DASH diet is lower in sodium than a typical American diet. Cut back on foods that are high in saturated fat, cholesterol, and trans fats. Eat more whole-grain foods, fish, poultry, and nuts Remain active and exercise as tolerated daily.  Monitor BP at home-Call if greater than 130/80.  Check CMP/CBC   Hyperlipidemia associated with T2DM (HCC) Discussed lifestyle modifications. Recommended diet heavy in fruits and veggies, omega 3's. Decrease consumption of animal meats, cheeses, and dairy products. Remain active and exercise as tolerated. Continue to monitor. Check lipids/TSH   Diabetes with diabetic chronic kidney disease [facility]) Education: Reviewed 'ABCs' of diabetes management  Discussed goals to be met and/or maintained include A1C (<7) Blood pressure (<130/80) Cholesterol (LDL <70) Continue Eye Exam yearly  Continue Dental Exam Q6 mo Discussed dietary recommendations Discussed Physical Activity recommendations Foot exam UTD Check A1C   CKD II associated with T2DM (HCC) Discussed how what you eat and drink can aide in kidney protection. Stay well hydrated. Avoid high salt foods. Avoid NSAIDS. Keep BP and BG well controlled.   Take medications as prescribed. Remain active and exercise as tolerated daily. Maintain weight.  Continue to monitor. Check CMP/GFR/Microablumin   Overweight with co morbidities Discussed appropriate BMI Diet modification. Physical activity. Encouraged/praised to build confidence.   Vitamin D Def At goal at last chec; continue supplementation Defer vit D level to CPE  Hypothyroidism Controlled. Continue Levothyroxine. Reminded to take on an empty stomach 30-60mins before food.  Stop any Biotin Supplement 48-72 hours before next TSH level to reduce the risk of falsely low TSH levels. Continue to monitor.     Tobacco  use Discussed risks associated with tobacco use and advised to reduce or quit Smoking cessation instruction/counseling given:  counseled patient on the dangers of tobacco use, advised patient to stop smoking, and reviewed strategies to maximize success   ADD Continue medications Dexedrine as needed Helps with focus, no AE's; uses rarely The patient was counseled on the addictive nature of the medication and was encouraged to take drug holidays when not needed.   PAC Continue HCTZ 12.5 mg Continue follow up with Dr. Johney Frame- cardiology  Medication management All medications discussed and reviewed in full. All questions and concerns regarding medications addressed.     Orders Placed This Encounter  Procedures   CBC with Differential/Platelet   COMPLETE METABOLIC PANEL WITH GFR   Lipid panel   Hemoglobin A1c   TSH   Notify office for further evaluation and treatment, questions or concerns if any reported s/s fail to improve.   The patient was advised to call back or seek an in-person evaluation if any symptoms worsen or if the condition fails to improve as anticipated.   Further disposition pending results of labs. Discussed med's effects and SE's.    I discussed the assessment and treatment plan with the patient. The patient was provided an opportunity to ask questions and all were answered. The patient agreed with the plan and demonstrated an understanding of the instructions.  Discussed med's effects and SE's. Screening labs and tests as requested with regular follow-up as recommended.  I provided 20 minutes of face-to-face time during this encounter including counseling, chart review, and critical decision making was preformed.   Future Appointments  Date Time Provider Department Center  01/23/2023  2:30 PM Lucky Cowboy, MD [facility] None  01/29/2023  3:10 PM Alver Sorrow, NP [facility]  04/26/2023  2:30 PM Adela Glimpse, NP [facility] None  07/25/2023  3:00 PM  Lucky Cowboy, MD [facility] None    ----------------------------------------------------------------------------------------------------------------------  HPI 62 y.o. male  presents for 3 month follow up on hypertension, cholesterol, T2 diabetes, weight, hypothyroid, tobacco use, hypogonadism on testosterone supplement, ADD and vitamin D deficiency.   Overall he is doing well.    He is continuing to switch care away from [place] and is going to get all meds refilled here.   he currently is trying to quit smoking.  Has used nicotine patches in the past.     He has COPD per imaging, CXR. Denies coughing, dyspnea, wheezing. Discussed low dose CT, would consider but wants to hold off until CPE.    Patient is on an ADD medication,  15 mg dextroamphetamine PRN; he takes very rarely - typically 1-2 occasions a month. He states that the medication is helping and he denies any adverse reactions.  He was evaluated by  Dr. Johney Frame- cardiology on 09/01/21 and was shown to have PAC's. His HCTZ was decreased to 12.5 mg. Atrial flutter resolved post ablation. Has follow up scheduled for 09/29/21. He has noted less PAC's since lowering dose.   BMI is Body mass index is 25.31 kg/m., he has been working on diet but not much exercise.  Wt Readings from Last 3 Encounters:  10/19/22 176 lb 6.4 oz (80 kg)  07/17/22 195 lb 3.2 oz (88.5 kg)  03/28/22 190 lb (86.2 kg)   His blood pressure has been controlled at home, today their BP is BP: 130/72  Elevated in clinic today. BP Readings from Last 3 Encounters:  10/19/22 130/72  07/17/22 (!) 150/68  04/05/22 (!) 147/86   On losartan for renal benefits with T2DM.   He does workout. He denies chest pain, shortness of breath, dizziness.   He is not on cholesterol medication, only taking fish oil  and denies myalgias. His LDL cholesterol is at goal, trigs on non-fasting labs are mildly elevated. The cholesterol last visit was:   Lab Results  Component Value Date    CHOL 69 10/19/2022   HDL 27 (L) 10/19/2022   LDLCALC 12 10/19/2022   TRIG 243 (H) 10/19/2022   CHOLHDL 2.6 10/19/2022    He has been working on diet and exercise for T2DM (controlled in normal range on metformin 1500 mg daily) and denies increased appetite, nausea, paresthesia of the feet, polydipsia, polyuria, visual disturbances and vomiting.  He admits hasn't checked glucose in a while-  Last A1C in the office was:  Lab Results  Component Value Date   HGBA1C 6.5 (H) 10/19/2022    He has CKD II associated with T2DM monitored at this office:  Lab Results  Component Value Date   EGFR 99 10/19/2022    He is on thyroid medication. His medication was not changed last visit.   Lab Results  Component Value Date   TSH 0.83 10/19/2022   Patient is on Vitamin D supplement and at goal at last check:    Lab Results  Component Value Date   VD25OH 79 07/17/2022     He has history of testosterone deficiency and is on testosterone replacement, taking 400 mg every 2 weeks, last shot 09/04/21. Is going to stop taking injections  Lab Results  Component Value Date   TESTOSTERONE 73 (L) 07/17/2022     Current Medications:  Current Outpatient Medications on File Prior to Visit  Medication Sig   acetaminophen (TYLENOL 8 HOUR  ARTHRITIS PAIN) 650 MG CR tablet Take 650 mg by mouth every 8 (eight) hours as needed for pain.   Cholecalciferol (VITAMIN D-3) 5000 UNITS TABS Take 5,000 Units by mouth daily.   cyclobenzaprine (FLEXERIL) 10 MG tablet Take 10 mg by mouth daily as needed for muscle spasms.   levothyroxine (SYNTHROID) 50 MCG tablet TAKE 1 TABLET BY MOUTH DAILY BEFORE BREAKFAST   losartan (COZAAR) 100 MG tablet Take  1 tablet  Daily  for BP                                                 /                                       TAKE                             BY                               MOUTH   metFORMIN (GLUCOPHAGE-XR) 750 MG 24 hr tablet TAKE 2 TABLETS (1,500 MG TOTAL) BY MOUTH DAILY.    metoprolol succinate (TOPROL XL) 100 MG 24 hr tablet Take  1 tablet  every Morning  for BP & Palpitations   omeprazole (PRILOSEC OTC) 20 MG tablet Take 20 mg by mouth daily.   ONE TOUCH ULTRA TEST test strip TEST BLOOD SUGAR 3 TIMES A DAY   ONETOUCH DELICA LANCETS 33G MISC USE TO CHECK SUGAR DAILY   rosuvastatin (CRESTOR) 10 MG tablet Take 1 tablet (10 mg total) by mouth daily.   tamsulosin (FLOMAX) 0.4 MG CAPS capsule Take 1 capsule (0.4 mg total) by mouth daily.   tirzepatide (MOUNJARO) 7.5 MG/0.5ML Pen Inject  1 pen (7.5 mg)  into Skin  every 7 days  for Diabetes  (Dx:  e11.29)   traMADol (ULTRAM) 50 MG tablet Take 50 mg by mouth every 6 (six) hours as needed for pain.   vitamin C (ASCORBIC ACID) 500 MG tablet Take 1,000 mg by mouth daily.   zinc gluconate 50 MG tablet Take 50 mg by mouth daily.   acetaminophen (TYLENOL) 500 MG tablet Take 1,000 mg by mouth every 6 (six) hours as needed for moderate pain.   varenicline (CHANTIX CONTINUING MONTH PAK) 1 MG tablet Take 1 tablet 2 x /day for tobacco cessation (Patient not taking: Reported on 10/19/2022)   No current facility-administered medications on file prior to visit.     Allergies: No Known Allergies   Medical History:  Past Medical History:  Diagnosis Date   Allergy    Blood transfusion without reported diagnosis    Chronic kidney disease    kidney stones   COPD (chronic obstructive pulmonary disease) (HCC)    DDD (degenerative disc disease)    Diabetes mellitus without complication (HCC)    GERD (gastroesophageal reflux disease)    Heart murmur    1971- per pt no issues reported by cardiology about murmur- Echo done   History of kidney stones    Hyperlipidemia    Hypertension    Hypogonadism male    Hypothyroidism    Typical atrial flutter (HCC) 2022  Vitamin D deficiency    Family history- Reviewed and unchanged Social history- Reviewed and unchanged   Review of Systems:  Review of Systems  Constitutional:   Negative for malaise/fatigue and weight loss.  HENT:  Negative for hearing loss and tinnitus.   Eyes:  Negative for blurred vision and double vision.  Respiratory:  Negative for cough, shortness of breath and wheezing.   Cardiovascular:  Negative for chest pain, palpitations, orthopnea, claudication and leg swelling.  Gastrointestinal:  Negative for abdominal pain, blood in stool, constipation, diarrhea, heartburn, melena, nausea and vomiting.  Genitourinary: Negative.   Musculoskeletal:  Negative for joint pain and myalgias.  Skin:  Negative for rash.  Neurological:  Negative for dizziness, tingling, sensory change, weakness and headaches.  Endo/Heme/Allergies:  Negative for polydipsia.  Psychiatric/Behavioral: Negative.    All other systems reviewed and are negative.    Physical Exam: BP 130/72   Pulse (!) 57   Temp 98.2 F (36.8 C)   Ht 5\' 10"  (1.778 m)   Wt 176 lb 6.4 oz (80 kg)   SpO2 98%   BMI 25.31 kg/m  Wt Readings from Last 3 Encounters:  10/19/22 176 lb 6.4 oz (80 kg)  07/17/22 195 lb 3.2 oz (88.5 kg)  03/28/22 190 lb (86.2 kg)   General Appearance: Well nourished, in no apparent distress. Eyes: PERRLA, EOMs, conjunctiva no swelling or erythema Sinuses: No Frontal/maxillary tenderness ENT/Mouth: Ext aud canals clear, TMs without erythema, bulging. No erythema, swelling, or exudate on post pharynx.  Tonsils not swollen or erythematous. Hearing normal.  Neck: Supple, thyroid normal.  Respiratory: Respiratory effort normal, BS equal bilaterally without rales, rhonchi, wheezing or stridor.  Cardio: RRR with no MRGs. Brisk peripheral pulses without edema.  Abdomen: Soft, + BS.  Non tender, no guarding, rebound, hernias, masses. Lymphatics: Non tender without lymphadenopathy.  Musculoskeletal: Full ROM, 5/5 strength, Normal gait Skin: Warm, dry without rashes, lesions, ecchymosis.  Neuro: Cranial nerves intact. No cerebellar symptoms.  Psych: Awake and oriented X 3,  normal affect, Insight and Judgment appropriate.    Adela Glimpse, NP 10:13 PM [facility]

## 2022-10-20 LAB — LIPID PANEL
Cholesterol: 69 mg/dL (ref <200)
HDL: 27 mg/dL — ABNORMAL LOW (ref >=40)
LDL Cholesterol (Calc): 12 mg/dL (calc)
Non-HDL Cholesterol (Calc): 42 mg/dL (calc) (ref <130)
Total CHOL/HDL Ratio: 2.6 (calc) (ref <5.0)
Triglycerides: 243 mg/dL — ABNORMAL HIGH (ref <150)

## 2022-10-20 LAB — CBC WITH DIFFERENTIAL/PLATELET
Absolute Monocytes: 585 cells/uL (ref 200–950)
Basophils Absolute: 30 cells/uL (ref 0–200)
Eosinophils Absolute: 84 cells/uL (ref 15–500)
HCT: 37.8 % — ABNORMAL LOW (ref 38.5–50.0)
Lymphs Abs: 3154 cells/uL (ref 850–3900)
MCH: 30.5 pg (ref 27.0–33.0)
MCHC: 33.6 g/dL (ref 32.0–36.0)
MCV: 90.6 fL (ref 80.0–100.0)
MPV: 12.1 fL (ref 7.5–12.5)
Monocytes Relative: 7.7 %
Neutro Abs: 3747 cells/uL (ref 1500–7800)
Neutrophils Relative %: 49.3 %
Platelets: 180 10*3/uL (ref 140–400)
RBC: 4.17 10*6/uL — ABNORMAL LOW (ref 4.20–5.80)
RDW: 12.7 % (ref 11.0–15.0)
WBC: 7.6 10*3/uL (ref 3.8–10.8)

## 2022-10-20 LAB — COMPLETE METABOLIC PANEL WITH GFR
AG Ratio: 2.3 (calc) (ref 1.0–2.5)
ALT: 32 U/L (ref 9–46)
AST: 19 U/L (ref 10–35)
Albumin: 4.3 g/dL (ref 3.6–5.1)
Alkaline phosphatase (APISO): 58 U/L (ref 35–144)
BUN: 22 mg/dL (ref 7–25)
CO2: 27 mmol/L (ref 20–32)
Calcium: 9.5 mg/dL (ref 8.6–10.3)
Chloride: 106 mmol/L (ref 98–110)
Creat: 0.89 mg/dL (ref 0.70–1.30)
Globulin: 1.9 g/dL (calc) (ref 1.9–3.7)
Glucose, Bld: 112 mg/dL — ABNORMAL HIGH (ref 65–99)
Potassium: 4.6 mmol/L (ref 3.5–5.3)
Sodium: 142 mmol/L (ref 135–146)
Total Bilirubin: 0.2 mg/dL (ref 0.2–1.2)
Total Protein: 6.2 g/dL (ref 6.1–8.1)
eGFR: 99 mL/min/{1.73_m2} (ref >=60)

## 2022-10-20 LAB — HEMOGLOBIN A1C
Hgb A1c MFr Bld: 6.5 % of total Hgb — ABNORMAL HIGH (ref <5.7)
Mean Plasma Glucose: 140 mg/dL
eAG (mmol/L): 7.7 mmol/L

## 2022-10-20 LAB — TSH: TSH: 0.83 mIU/L (ref 0.40–4.50)

## 2022-10-24 NOTE — Patient Instructions (Signed)

## 2022-11-08 ENCOUNTER — Other Ambulatory Visit: Payer: Self-pay | Admitting: Internal Medicine

## 2022-11-08 MED ORDER — TIRZEPATIDE 5 MG/0.5ML ~~LOC~~ SOAJ: SUBCUTANEOUS | 1 refills | Status: DC

## 2022-11-12 ENCOUNTER — Other Ambulatory Visit: Payer: Self-pay | Admitting: Internal Medicine

## 2022-11-16 ENCOUNTER — Other Ambulatory Visit (HOSPITAL_BASED_OUTPATIENT_CLINIC_OR_DEPARTMENT_OTHER): Payer: Self-pay | Admitting: Family

## 2022-11-16 ENCOUNTER — Other Ambulatory Visit: Payer: Self-pay | Admitting: Internal Medicine

## 2022-11-16 DIAGNOSIS — E782 Mixed hyperlipidemia: Secondary | ICD-10-CM

## 2022-11-16 DIAGNOSIS — I10 Essential (primary) hypertension: Secondary | ICD-10-CM

## 2022-11-16 DIAGNOSIS — E781 Pure hyperglyceridemia: Secondary | ICD-10-CM

## 2022-11-27 ENCOUNTER — Other Ambulatory Visit: Payer: Self-pay | Admitting: Nurse Practitioner

## 2022-11-27 DIAGNOSIS — E1122 Type 2 diabetes mellitus with diabetic chronic kidney disease: Secondary | ICD-10-CM

## 2022-11-30 ENCOUNTER — Encounter: Payer: Self-pay | Admitting: Nurse Practitioner

## 2022-11-30 ENCOUNTER — Other Ambulatory Visit: Payer: Self-pay | Admitting: Internal Medicine

## 2022-11-30 DIAGNOSIS — M5136 Other intervertebral disc degeneration, lumbar region: Secondary | ICD-10-CM

## 2022-11-30 DIAGNOSIS — M51369 Other intervertebral disc degeneration, lumbar region without mention of lumbar back pain or lower extremity pain: Secondary | ICD-10-CM

## 2022-11-30 MED ORDER — CYCLOBENZAPRINE HCL 10 MG PO TABS
ORAL_TABLET | ORAL | 3 refills | Status: AC
Start: 2022-11-30 — End: ?

## 2022-12-17 ENCOUNTER — Other Ambulatory Visit (HOSPITAL_BASED_OUTPATIENT_CLINIC_OR_DEPARTMENT_OTHER): Payer: Self-pay | Admitting: Family

## 2022-12-18 NOTE — Telephone Encounter (Signed)
Rx request sent to pharmacy.  

## 2022-12-22 ENCOUNTER — Other Ambulatory Visit: Payer: Self-pay | Admitting: Nurse Practitioner

## 2022-12-22 DIAGNOSIS — R3912 Poor urinary stream: Secondary | ICD-10-CM

## 2023-01-23 ENCOUNTER — Encounter: Payer: Self-pay | Admitting: Internal Medicine

## 2023-01-23 ENCOUNTER — Ambulatory Visit (INDEPENDENT_AMBULATORY_CARE_PROVIDER_SITE_OTHER): Payer: 59 | Admitting: Internal Medicine

## 2023-01-23 VITALS — BP 122/72 | HR 62 | Temp 98.2°F | Resp 17 | Ht 70.0 in | Wt 174.0 lb

## 2023-01-23 DIAGNOSIS — E039 Hypothyroidism, unspecified: Secondary | ICD-10-CM

## 2023-01-23 DIAGNOSIS — E559 Vitamin D deficiency, unspecified: Secondary | ICD-10-CM

## 2023-01-23 DIAGNOSIS — Z79899 Other long term (current) drug therapy: Secondary | ICD-10-CM

## 2023-01-23 DIAGNOSIS — E1122 Type 2 diabetes mellitus with diabetic chronic kidney disease: Secondary | ICD-10-CM | POA: Diagnosis not present

## 2023-01-23 DIAGNOSIS — E1169 Type 2 diabetes mellitus with other specified complication: Secondary | ICD-10-CM

## 2023-01-23 DIAGNOSIS — I10 Essential (primary) hypertension: Secondary | ICD-10-CM

## 2023-01-23 DIAGNOSIS — E785 Hyperlipidemia, unspecified: Secondary | ICD-10-CM

## 2023-01-23 DIAGNOSIS — N182 Chronic kidney disease, stage 2 (mild): Secondary | ICD-10-CM

## 2023-01-23 DIAGNOSIS — E349 Endocrine disorder, unspecified: Secondary | ICD-10-CM

## 2023-01-23 NOTE — Progress Notes (Signed)
Future Appointments  Date Time Provider Department  01/23/2023                            6 mo ov                2:30 PM Lucky Cowboy, MD [facility]  01/29/2023  3:10 PM Alver Sorrow, NP [facility]  04/26/2023                             9 mo ov   2:30 PM Adela Glimpse, NP [facility]  07/26/2023                             cpe  3:00 PM Lucky Cowboy, MD [facility]    History of Present Illness:       This very nice 62 y.o. single WM presents for 6 month follow up with HTN, HLD, T2_NIDDM , Hypothyroidism, Testosterone Deficiency and Vitamin D Deficiency.        Patient is treated for HTN (2014)  & BP has been controlled at home. Today's BP is at goal -  122/72.  In Oct 2022, patient underwent an ablation by Dr Johney Frame for [facility] [employer] . Patient has had no complaints of any cardiac type chest pain, palpitations, dyspnea Pollyann Kennedy /PND, dizziness, claudication or dependent edema.                                                      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  In Nov 2022, patient had removal of hardware from previous fusions with interbody fusion L2-5.         Hyperlipidemia is controlled with diet & meds. Patient denies myalgias or other med SE's. Last Lipids were at goal :  Lab Results  Component Value Date   CHOL 69 10/19/2022   HDL 27 (L) 10/19/2022   LDLCALC 12 10/19/2022   TRIG 243 (H) 10/19/2022   CHOLHDL 2.6 10/19/2022     Also, the patient has history of T2_NIDDM  (2013) and has had no symptoms of reactive hypoglycemia, diabetic polys, paresthesias or visual blurring.  Last A1c was not at goal :  Lab Results  Component Value Date   HGBA1C 6.5 (H) 10/19/2022                                                     Patient was dx'd Hypothyroid in 2015 and has been on Thyroid Replacement since then.                                                     Patient also has Testosterone Deficiency & is on Depo   Testosterone with improved stamina & sense of well being.  Further, the patient also has history of Vitamin D Deficiency  ("33" /2013)  and supplements vitamin D without any suspected side-effects. Last vitamin D was at goal :  Lab Results  Component Value Date   VD25OH 79 07/17/2022       Current Outpatient Medications on File Prior to Visit  Medication Sig   aspirin EC 81 MG tablet Take daily.   VITAMIN D5000 UNITS TABS Take  daily.   Cyclobenzaprine 10 MG tablet Take 5 mg 3 times daily as needed for muscle spasms.   DEXEDRINE SPANSULE 15 MG  Take 15 mg daily as needed (attention).   ibuprofen 200 MG tablet Take 800 as needed for moderate pain.   levothyroxine 50 MCG tablet TAKE 1 TABLET DAILY    losartan 100 MG tablet Take  daily.   metFORMIN-XR 500 MG 2 Take 1 x times daily with meals.   NICODERM CQ  21 mg/24hr patch Place 21 mg onto the skin dai   Omega-3 Fatty Acids  1200 MG CPDR Take 2,400 mg daily.   tamsulosin 0.4 MG CAPS capsule Take 0.4 mg daily.   testosterone cypio 200 MG/ML injection USE IM EVERY 2 WEEKS    traMADol 50 MG tablet Take 50 mg every 6 hours as needed for pain.   TURMERIC 2,000 mg Take  daily.   vitamin C 500 MG tablet Take 2,000 mg daily as needed (immune support).   Zinc 50 MG TABS Take daily.    No Known Allergies   PMHx:   Past Medical History:  Diagnosis Date   COPD (chronic obstructive pulmonary disease) (HCC)    DDD (degenerative disc disease)    Depression 10/09/2013   Diabetes mellitus without complication (HCC)    GERD (gastroesophageal reflux disease)    Heart murmur    1971   History of kidney stones    Hyperlipidemia    Hypertension    Hypogonadism male    Hypothyroidism    Vitamin D deficiency      Immunization History  Administered Date(s) Administered   DTaP 04/04/2001, 04/24/2008   Influenza Inj Mdck Quad  02/21/2017   Influenza Whole 01/09/2013    Influenza,inj,quad 02/17/2016   PFIZER-SARS-COV-2 Vacc 07/04/2019, 07/25/2019, 03/11/2020   PPD Test 04/02/2018, 05/07/2019, 05/26/2020   Pneumococcal -23 02/21/2017   Tdap 07/17/2013     Past Surgical History:  Procedure Date   ASD REPAIR 1971   open heart   CERVICAL FUSION 1991   ESOPHAGOGASTRODUODENOSCOPY 1987,1992,1993,1995   duodenal ulcer   LUMBAR DISC SURGERY    LUMBAR FUSION 2001   LUMBAR LAMINECTOMY 7/97    FHx:    Reviewed / unchanged  SHx:    Reviewed / unchanged   Systems Review:  Constitutional: Denies fever, chills, wt changes, headaches, insomnia, fatigue, night sweats, change in appetite. Eyes: Denies redness, blurred vision, diplopia, discharge, itchy, watery eyes.  ENT: Denies discharge, congestion, post nasal drip, epistaxis, sore throat, earache, hearing loss, dental pain, tinnitus, vertigo, sinus pain, snoring.  CV: Denies chest pain, palpitations, irregular heartbeat, syncope, dyspnea, diaphoresis, orthopnea, PND, claudication or edema. Respiratory: denies cough, dyspnea, DOE, pleurisy, hoarseness, laryngitis, wheezing.  Gastrointestinal: Denies dysphagia, odynophagia, heartburn, reflux, water brash, abdominal pain or cramps, nausea, vomiting, bloating, diarrhea, constipation, hematemesis, melena, hematochezia  or hemorrhoids. Genitourinary: Denies dysuria, frequency, urgency, nocturia, hesitancy, discharge, hematuria or flank pain. Musculoskeletal: Denies arthralgias, myalgias, stiffness, jt. swelling, pain, limping or strain/sprain.  Skin: Denies pruritus, rash, hives, warts, acne, eczema or change in skin  lesion(s). Neuro: No weakness, tremor, incoordination, spasms, paresthesia or pain. Psychiatric: Denies confusion, memory loss or sensory loss. Endo: Denies change in weight, skin or hair change.  Heme/Lymph: No excessive bleeding, bruising or enlarged lymph nodes.  Physical Exam  BP 122/72   Pulse 62   Temp 98.2 F (36.8 C)   Resp 17   Ht 5'  10" (1.778 m)   Wt 174 lb (78.9 kg)   SpO2 96%   BMI 24.97 kg/m   Appears  well nourished, well groomed  and in no distress.  Eyes: PERRLA, EOMs, conjunctiva no swelling or erythema. Sinuses: No frontal/maxillary tenderness ENT/Mouth: EAC's clear, TM's nl w/o erythema, bulging. Nares clear w/o erythema, swelling, exudates. Oropharynx clear without erythema or exudates. Oral hygiene is good. Tongue normal, non obstructing. Hearing intact.  Neck: Supple. Thyroid not palpable. Car 2+/2+ without bruits, nodes or JVD. Chest: Respirations nl with BS clear & equal w/o rales, rhonchi, wheezing or stridor.  Cor: Heart sounds normal w/ regular rate and rhythm without sig. murmurs, gallops, clicks or rubs. Peripheral pulses normal and equal  without edema.  Abdomen: Soft & bowel sounds normal. Non-tender w/o guarding, rebound, hernias, masses or organomegaly.  Lymphatics: Unremarkable.  Musculoskeletal: Full ROM all peripheral extremities, joint stability, 5/5 strength and normal gait.  Skin: Warm, dry without exposed rashes, lesions or ecchymosis apparent.  Neuro: Cranial nerves intact, reflexes equal bilaterally. Sensory-motor testing grossly intact. Tendon reflexes grossly intact.  Pysch: Alert & oriented x 3.  Insight and judgement nl & appropriate. No ideations.  Assessment and Plan:   1. Essential hypertension  - Continue medication, monitor blood pressure at home.  - Continue DASH diet.  Reminder to go to the ER if any CP,  SOB, nausea, dizziness, severe HA, changes vision/speech.    - CBC with Differential/Platelet - COMPLETE METABOLIC PANEL WITH GFR - Magnesium   2. Hyperlipidemia associated with type 2 diabetes mellitus (HCC)  - Continue diet/meds, exercise,& lifestyle modifications.  - Continue monitor periodic cholesterol/liver & renal functions     - Lipid panel - TSH   3. Type 2 diabetes mellitus with stage 2 chronic kidney                                      disease,  without long-term current use of insulin (HCC)  - Continue diet, exercise  - Lifestyle modifications.  - Monitor appropriate labs   - Hemoglobin A1c - Insulin, random   4. Vitamin D deficiency  - Continue supplementation  - VITAMIN D 25 Hydroxy   5. Hypothyroidism  - TSH   6. Medication management  - CBC with Differential/Platelet - COMPLETE METABOLIC PANEL WITH GFR - Magnesium - Lipid panel - TSH - Hemoglobin A1c - Insulin, random - VITAMIN D 25 Hydroxy           Discussed  regular exercise, BP monitoring, weight control to achieve/maintain BMI less than 25 and discussed med and SE's. Recommended labs to assess and monitor clinical status with further disposition pending results of labs.  I discussed the assessment and treatment plan with the patient. The patient was provided an opportunity to ask questions and all were answered. The patient agreed with the plan and demonstrated an understanding of the instructions.  I provided over 30 minutes of exam, counseling, chart review and  complex critical decision making.        The  patient was advised to call back or seek an in-person evaluation if the symptoms worsen or if the condition fails to improve as anticipated.   Marinus Maw, MD

## 2023-01-23 NOTE — Patient Instructions (Signed)

## 2023-01-24 LAB — COMPLETE METABOLIC PANEL WITH GFR
AG Ratio: 2.5 (calc) (ref 1.0–2.5)
ALT: 31 U/L (ref 9–46)
AST: 21 U/L (ref 10–35)
Albumin: 5 g/dL (ref 3.6–5.1)
Alkaline phosphatase (APISO): 72 U/L (ref 35–144)
BUN: 18 mg/dL (ref 7–25)
CO2: 28 mmol/L (ref 20–32)
Calcium: 10.1 mg/dL (ref 8.6–10.3)
Chloride: 102 mmol/L (ref 98–110)
Creat: 0.95 mg/dL (ref 0.70–1.30)
Globulin: 2 g/dL (ref 1.9–3.7)
Glucose, Bld: 126 mg/dL — ABNORMAL HIGH (ref 65–99)
Potassium: 4.7 mmol/L (ref 3.5–5.3)
Sodium: 139 mmol/L (ref 135–146)
Total Bilirubin: 0.4 mg/dL (ref 0.2–1.2)
Total Protein: 7 g/dL (ref 6.1–8.1)
eGFR: 92 mL/min/{1.73_m2} (ref >=60)

## 2023-01-24 LAB — LIPID PANEL
Cholesterol: 90 mg/dL (ref <200)
HDL: 32 mg/dL — ABNORMAL LOW (ref >=40)
LDL Cholesterol (Calc): 28 mg/dL
Non-HDL Cholesterol (Calc): 58 mg/dL (ref <130)
Total CHOL/HDL Ratio: 2.8 (calc) (ref <5.0)
Triglycerides: 238 mg/dL — ABNORMAL HIGH (ref <150)

## 2023-01-24 LAB — CBC WITH DIFFERENTIAL/PLATELET
Absolute Monocytes: 741 {cells}/uL (ref 200–950)
Basophils Absolute: 38 {cells}/uL (ref 0–200)
Basophils Relative: 0.4 %
Eosinophils Absolute: 162 {cells}/uL (ref 15–500)
Eosinophils Relative: 1.7 %
HCT: 43.5 % (ref 38.5–50.0)
Hemoglobin: 14.2 g/dL (ref 13.2–17.1)
Lymphs Abs: 3382 {cells}/uL (ref 850–3900)
MCH: 30.4 pg (ref 27.0–33.0)
MCHC: 32.6 g/dL (ref 32.0–36.0)
MCV: 93.1 fL (ref 80.0–100.0)
MPV: 11.8 fL (ref 7.5–12.5)
Monocytes Relative: 7.8 %
Neutro Abs: 5178 {cells}/uL (ref 1500–7800)
Neutrophils Relative %: 54.5 %
Platelets: 234 10*3/uL (ref 140–400)
RBC: 4.67 10*6/uL (ref 4.20–5.80)
RDW: 13 % (ref 11.0–15.0)
Total Lymphocyte: 35.6 %
WBC: 9.5 10*3/uL (ref 3.8–10.8)

## 2023-01-24 LAB — HEMOGLOBIN A1C
Hgb A1c MFr Bld: 6.3 %{Hb} — ABNORMAL HIGH (ref <5.7)
Mean Plasma Glucose: 134 mg/dL
eAG (mmol/L): 7.4 mmol/L

## 2023-01-24 LAB — VITAMIN D 25 HYDROXY (VIT D DEFICIENCY, FRACTURES): Vit D, 25-Hydroxy: 71 ng/mL (ref 30–100)

## 2023-01-24 LAB — TESTOSTERONE: Testosterone: 105 ng/dL — ABNORMAL LOW (ref 250–827)

## 2023-01-24 LAB — MAGNESIUM: Magnesium: 2.1 mg/dL (ref 1.5–2.5)

## 2023-01-24 LAB — INSULIN, RANDOM: Insulin: 52 u[IU]/mL — ABNORMAL HIGH

## 2023-01-24 LAB — TSH: TSH: 1.82 m[IU]/L (ref 0.40–4.50)

## 2023-01-24 NOTE — Progress Notes (Signed)
<>*<>*<>*<>*<>*<>*<>*<>*<>*<>*<>*<>*<>*<>*<>*<>*<>*<>*<>*<>*<>*<>*<>*<>*<> <>*<>*<>*<>*<>*<>*<>*<>*<>*<>*<>*<>*<>*<>*<>*<>*<>*<>*<>*<>*<>*<>*<>*<>*<>  -Test results slightly outside the reference range are not unusual. If there is anything important, I will review this with you,  otherwise it is considered normal test values.  If you have further questions,  please do not hesitate to contact me at the office or via My Chart.   <>*<>*<>*<>*<>*<>*<>*<>*<>*<>*<>*<>*<>*<>*<>*<>*<>*<>*<>*<>*<>*<>*<>*<>*<> <>*<>*<>*<>*<>*<>*<>*<>*<>*<>*<>*<>*<>*<>*<>*<>*<>*<>*<>*<>*<>*<>*<>*<>*<>  -  A1c = 6.3% Better - down from 7.8% - But still too high                                                    ( Ideal or goal is less than 5.7%  )  <>*<>*<>*<>*<>*<>*<>*<>*<>*<>*<>*<>*<>*<>*<>*<>*<>*<>*<>*<>*<>*<>*<>*<>*<> <>*<>*<>*<>*<>*<>*<>*<>*<>*<>*<>*<>*<>*<>*<>*<>*<>*<>*<>*<>*<>*<>*<>*<>*<>  -  Testosterone low -   9 Ways to Naturally Increase Testosterone Levels  1.   Lose Weight  If you're overweight, shedding the excess pounds may increase your testosterone levels, according to research presented at the Endocrine Society's 2012 meeting. Overweight men are more likely to have low testosterone levels to begin with, so this is an important trick to increase your body's testosterone production when you need it most.  2.   High-Intensity Exercise like Peak Fitness   Short intense exercise has a proven positive effect on increasing testosterone levels and preventing its decline. That's unlike aerobics or prolonged moderate exercise, which have shown to have negative or no effect on testosterone levels. Having a whey protein meal after exercise can further enhance the satiety/testosterone-boosting impact (hunger hormones cause the opposite effect on your testosterone and libido). Here's a summary of what a typical high-intensity Peak Fitness routine might look like: " Warm up for three minutes  " Exercise as hard  and fast as you can for 30 seconds. You should feel like you couldn't possibly go on another few seconds  " Recover at a slow to moderate pace for 90 seconds  " Repeat the high intensity exercise and recovery 7 more times .  3.   Consume Plenty of Zinc  The mineral zinc is important for testosterone production, and supplementing your diet for as little as six weeks has been shown to cause a marked improvement in testosterone among men with low levels.1 Likewise, research has shown that restricting dietary sources of zinc leads to a significant decrease in testosterone, while zinc supplementation increases it - and even protects men from exercised-induced reductions in testosterone levels.                                  It's estimated that up to 45 percent of adults over the age of 60 may have lower than recommended zinc intakes; even when dietary supplements were added in, an estimated 20-25 percent of older adults still had inadequate zinc intakes, according to a [employer] and Nutrition Examination Survey.4 Your diet is the best source of zinc; along with protein-rich foods like  fish, other good dietary sources of zinc include raw milk, raw cheese, beans, and yogurt or kefir made from raw milk. It can be difficult to obtain enough dietary zinc if you're a vegetarian, and also for meat-eaters as well, largely because of conventional farming methods that rely heavily on chemical fertilizers and pesticides. These chemicals deplete the soil of nutrients ... nutrients like zinc that must be absorbed by plants in order to be passed on  to you. In many cases, you may further deplete the nutrients in your food by the way you prepare it. For most food, cooking it will drastically reduce its levels of nutrients like zinc ... particularly over-cooking, which many people do. If you decide to use a zinc supplement, stick to a dosage of 50 mg a day, as this is the recommended adult dose. Taking too much zinc  can interfere with your body's ability to absorb other minerals, especially copper, and may cause nausea as a side effect.  4.   Strength Training  In addition to Peak Fitness, strength training is also known to boost testosterone levels, provided you are doing so intensely enough. When strength training to boost testosterone, you'll want to increase the weight and lower your number of reps, and then focus on exercises that work a large number of muscles, such as dead lifts or squats.  You can "turbo-charge" your weight training by going slower. By slowing down your movement, you're actually turning it into a high-intensity exercise. Super Slow movement allows your muscle, at the microscopic level, to access the maximum number of cross-bridges between the protein filaments that produce movement in the muscle.   5.   Optimize Your Vitamin D Levels  Vitamin D, a steroid hormone, is essential for the healthy development of the nucleus of the sperm cell, and helps maintain semen quality and sperm count. Vitamin D also increases levels of testosterone, which may boost libido. In one study, overweight men who were given vitamin D supplements had a significant increase in testosterone levels after one year.5   6.   Reduce Stress  When you're under a lot of stress, your body releases high levels of the stress hormone cortisol. This hormone actually blocks the effects of testosterone,6 presumably because, from a biological standpoint, testosterone-associated behaviors (mating, competing, aggression) may have lowered your chances of survival in an emergency (hence, the "fight or flight" response is dominant, courtesy of cortisol).  7.   Limit or Eliminate Sugar from Your Diet  Testosterone levels decrease after you eat sugar, which is likely because the sugar leads to a high insulin level, another factor leading to low testosterone.7 Based on USDA estimates, the average American consumes 12 teaspoons of  sugar a day, which equates to about TWO TONS of sugar during a lifetime.  8.   Eat Healthy Fats  By healthy, this means not only mono- and polyunsaturated fats, like that found in avocadoes and nuts, but also saturated, as these are essential for building testosterone. Research shows that a diet with less than 40 percent of energy as fat (and that mainly from animal sources, i.e. saturated) lead to a decrease in testosterone levels. ie eat less animal products - as Meat , poultry and dairy. Experts agree that the ideal diet includes somewhere between 50-70 percent fat.  It's important to understand that your body requires saturated fats from animal and vegetable sources (such as meat, dairy, certain oils, and tropical plants like coconut) for optimal functioning, and if you neglect this important food group in favor of sugar, grains and other starchy carbs, your health and weight are almost guaranteed to suffer. Examples of healthy fats you can eat more of to give your testosterone levels a boost include: Olives and Olive oil  Coconuts and coconut oil Butter made from raw grass-fed organic milk Raw nuts, such as, almonds or pecans Organic pastured egg yolks Avocados Grass-fed meats Palm oil Unheated organic nut oils  9.  Boost Your Intake of Branch Chain Amino Acids (BCAA) from Foods Like Whey Protein Research suggests that BCAAs result in higher testosterone levels, particularly when taken along with resistance training. While BCAAs are available in supplement form, you'll find the highest concentrations of BCAAs like leucine in whey protein. Even when getting leucine from your natural food supply, it's often wasted or used as a building block instead of an anabolic agent. So to create the correct anabolic environment, you need to boost leucine consumption way beyond mere maintenance levels. That said, keep in mind that using leucine as a free form amino acid can be highly counterproductive as when  free form amino acids are artificially administrated, they rapidly enter your circulation while disrupting insulin function, and impairing your body's glycemic control. Food-based leucine is really the ideal form that can benefit your muscles without side effects.   <>*<>*<>*<>*<>*<>*<>*<>*<>*<>*<>*<>*<>*<>*<>*<>*<>*<>*<>*<>*<>*<>*<>*<>*<> <>*<>*<>*<>*<>*<>*<>*<>*<>*<>*<>*<>*<>*<>*<>*<>*<>*<>*<>*<>*<>*<>*<>*<>*<>  -  Chol = 90 - Excellent  - Continue Crestor (Rosuvastatin)    same  <>*<>*<>*<>*<>*<>*<>*<>*<>*<>*<>*<>*<>*<>*<>*<>*<>*<>*<>*<>*<>*<>*<>*<>*<> <>*<>*<>*<>*<>*<>*<>*<>*<>*<>*<>*<>*<>*<>*<>*<>*<>*<>*<>*<>*<>*<>*<>*<>*<>  -  but Triglycerides (  = 238 ) or fats in blood are too high                 (   Ideal or  Goal is less than 150  !  )    - Recommend avoid fried & greasy foods,  sweets / candy,   - Avoid white rice  (brown or wild rice or Quinoa is OK),   - Avoid white potatoes  (sweet potatoes are OK)   - Avoid anything made from white flour  - bagels, doughnuts, rolls, buns, biscuits, white and   wheat breads, pizza crust and traditional  pasta made of white flour & egg white  - (vegetarian pasta or spinach or wheat pasta is OK).    - Multi-grain bread is OK - like multi-grain flat bread or  sandwich thins.   - Avoid alcohol in excess.   - Exercise is also important.  <>*<>*<>*<>*<>*<>*<>*<>*<>*<>*<>*<>*<>*<>*<>*<>*<>*<>*<>*<>*<>*<>*<>*<>*<> <>*<>*<>*<>*<>*<>*<>*<>*<>*<>*<>*<>*<>*<>*<>*<>*<>*<>*<>*<>*<>*<>*<>*<>*<>  -  Vitamin D = 71 - Excellent - Please continue dosage same   <>*<>*<>*<>*<>*<>*<>*<>*<>*<>*<>*<>*<>*<>*<>*<>*<>*<>*<>*<>*<>*<>*<>*<>*<> <>*<>*<>*<>*<>*<>*<>*<>*<>*<>*<>*<>*<>*<>*<>*<>*<>*<>*<>*<>*<>*<>*<>*<>*<>  -  All Else - CBC - Kidneys - Electrolytes - Liver - Magnesium & Thyroid    - all  Normal /  OK  <>*<>*<>*<>*<>*<>*<>*<>*<>*<>*<>*<>*<>*<>*<>*<>*<>*<>*<>*<>*<>*<>*<>*<>*<> <>*<>*<>*<>*<>*<>*<>*<>*<>*<>*<>*<>*<>*<>*<>*<>*<>*<>*<>*<>*<>*<>*<>*<>*<>

## 2023-01-29 ENCOUNTER — Encounter (HOSPITAL_BASED_OUTPATIENT_CLINIC_OR_DEPARTMENT_OTHER): Payer: Self-pay | Admitting: Family

## 2023-01-29 ENCOUNTER — Ambulatory Visit (HOSPITAL_BASED_OUTPATIENT_CLINIC_OR_DEPARTMENT_OTHER): Payer: 59 | Admitting: Family

## 2023-01-29 ENCOUNTER — Encounter (HOSPITAL_BASED_OUTPATIENT_CLINIC_OR_DEPARTMENT_OTHER): Payer: Self-pay

## 2023-01-29 VITALS — BP 124/82 | HR 60 | Ht 69.0 in | Wt 175.8 lb

## 2023-01-29 DIAGNOSIS — I10 Essential (primary) hypertension: Secondary | ICD-10-CM

## 2023-01-29 DIAGNOSIS — E785 Hyperlipidemia, unspecified: Secondary | ICD-10-CM | POA: Diagnosis not present

## 2023-01-29 DIAGNOSIS — I2510 Atherosclerotic heart disease of native coronary artery without angina pectoris: Secondary | ICD-10-CM | POA: Diagnosis not present

## 2023-01-29 DIAGNOSIS — R002 Palpitations: Secondary | ICD-10-CM | POA: Diagnosis not present

## 2023-01-29 DIAGNOSIS — Z720 Tobacco use: Secondary | ICD-10-CM

## 2023-01-29 NOTE — Progress Notes (Signed)
Cardiology Office Note:  .   Date:  01/29/2023  ID:  Paul Gates, DOB 1960-09-10, MRN [id number] PCP: Lucky Cowboy, MD  [facility] Providers Cardiologist:  None Cardiology APP:  Alver Sorrow, NP  Electrophysiologist:  Hillis Range, MD (Inactive)    History of Present Illness: .   Paul Gates is a 62 y.o. male with a hx of atrial flutter s/p ablation 01/2021 no longer requiring OAC, NICM which resolved with restoration of NSR, hypertension, PACs, hypothyroidism, diabetes, HLD.   When last seen 09/01/21 by Dr. Johney Frame he had been started on HCTZ by [facility] provider. He noted some hypotensive episodes and PACs which were symptomatic and noted on EKG. His HCTZ was reduced to 12.5mg  daily. He was encouraged sodium restriction and tobacco cessation.    Seen 09/29/21 noting PACs were quiescent. BP was well controlled and preferred to simplify regimen. HCTZ was discontinued.  Last seen 02/01/22 with PAC overall quiescent. He was referred to psychology for smoking cessation.    He presents today for follow up. Pleasant gentleman who works for [employer] and [employer] outside multiple times weekly. Still some issues with glute muscle related to neurosurgery history.  Reports no shortness of breath nor dyspnea on exertion. Reports no chest pain, pressure, or tightness. No edema, orthopnea, PND. Reports no palpitations - prior PAC have resolved.   ROS: Please see the history of present illness.    All other systems reviewed and are negative.   Studies Reviewed: .        Cardiac Studies & Procedures       ECHOCARDIOGRAM  ECHOCARDIOGRAM COMPLETE 04/26/2021  Narrative ECHOCARDIOGRAM REPORT    Patient Name:   Paul Gates Date of Exam: 04/26/2021 Medical Rec #:  [id number]             Height:       70.0 in Accession #:    [id number]            Weight:       181.0 lb Date of Birth:  23-Mar-1961            BSA:          2.001 m Patient  Age:    60 years              BP:           124/70 mmHg Patient Gender: M                     HR:           78 bpm. Exam Location:  [address]  Procedure: 2D Echo, 3D Echo, Cardiac Doppler and Color Doppler  Indications:    I42.9 Cardiomyopathy  History:        Patient has prior history of Echocardiogram examinations, most recent 01/10/2021. COPD, Arrythmias:Atrial Flutter, Signs/Symptoms:Murmur; Risk Factors:Hypertension, Diabetes, Current Smoker and Dyslipidemia. History of ASD status post Repair (1971), Atrial Flutter status post Ablation, Non-Ischemic Cardiomyopathy (prior EF 45-50%), Recent Back surgery.  Sonographer:    Farrel Conners RDCS Referring Phys: Hillis Range  IMPRESSIONS   1. Left ventricular ejection fraction, by estimation, is 55 to 60%. The left ventricle has normal function. The left ventricle has no regional wall motion abnormalities. There is mild concentric left ventricular hypertrophy. Left ventricular diastolic parameters were normal. 2. Right ventricular systolic function is normal. The right ventricular size is normal. There is normal pulmonary  artery systolic pressure. 3. The mitral valve is normal in structure. Trivial mitral valve regurgitation. No evidence of mitral stenosis. 4. The aortic valve is tricuspid. Aortic valve regurgitation is not visualized. No aortic stenosis is present. 5. The inferior vena cava is normal in size with greater than 50% respiratory variability, suggesting right atrial pressure of 3 mmHg.  FINDINGS Left Ventricle: Left ventricular ejection fraction, by estimation, is 55 to 60%. The left ventricle has normal function. The left ventricle has no regional wall motion abnormalities. The left ventricular internal cavity size was normal in size. There is mild concentric left ventricular hypertrophy. Left ventricular diastolic parameters were normal. Indeterminate filling pressures.  Right Ventricle: The right ventricular size is  normal. No increase in right ventricular wall thickness. Right ventricular systolic function is normal. There is normal pulmonary artery systolic pressure. The tricuspid regurgitant velocity is 1.60 m/s, and with an assumed right atrial pressure of 3 mmHg, the estimated right ventricular systolic pressure is 13.2 mmHg.  Left Atrium: Left atrial size was normal in size.  Right Atrium: Right atrial size was normal in size.  Pericardium: There is no evidence of pericardial effusion.  Mitral Valve: The mitral valve is normal in structure. Trivial mitral valve regurgitation. No evidence of mitral valve stenosis.  Tricuspid Valve: The tricuspid valve is normal in structure. Tricuspid valve regurgitation is trivial. No evidence of tricuspid stenosis.  Aortic Valve: The aortic valve is tricuspid. Aortic valve regurgitation is not visualized. No aortic stenosis is present.  Pulmonic Valve: The pulmonic valve was normal in structure. Pulmonic valve regurgitation is not visualized. No evidence of pulmonic stenosis.  Aorta: The aortic root is normal in size and structure.  Venous: The inferior vena cava is normal in size with greater than 50% respiratory variability, suggesting right atrial pressure of 3 mmHg.  IAS/Shunts: No atrial level shunt detected by color flow Doppler.   LEFT VENTRICLE PLAX 2D LVIDd:         4.75 cm   Diastology LVIDs:         3.40 cm   LV e' medial:    9.36 cm/s LV PW:         1.15 cm   LV E/e' medial:  10.6 LV IVS:        1.00 cm   LV e' lateral:   10.30 cm/s LVOT diam:     2.30 cm   LV E/e' lateral: 9.6 LV SV:         69 LV SV Index:   35 LVOT Area:     4.15 cm  3D Volume EF: 3D EF:        60 % LV EDV:       159 ml LV ESV:       63 ml LV SV:        96 ml  RIGHT VENTRICLE RV S prime:     12.50 cm/s TAPSE (M-mode): 2.0 cm  LEFT ATRIUM             Index        RIGHT ATRIUM           Index LA diam:        4.15 cm 2.07 cm/m   RA Area:     17.20 cm LA Vol  (A2C):   51.1 ml 25.54 ml/m  RA Volume:   49.10 ml  24.54 ml/m LA Vol (A4C):   51.1 ml 25.54 ml/m LA Biplane Vol: 51.8 ml 25.89 ml/m  AORTIC VALVE LVOT Vmax:   89.65 cm/s LVOT Vmean:  57.900 cm/s LVOT VTI:    0.166 m  AORTA Ao Root diam: 3.40 cm Ao Asc diam:  3.00 cm  MITRAL VALVE               TRICUSPID VALVE MV Area (PHT): cm         TR Peak grad:   10.2 mmHg MV Decel Time: 163 msec    TR Vmax:        160.00 cm/s MV E velocity: 98.87 cm/s MV A velocity: 76.67 cm/s  SHUNTS MV E/A ratio:  1.29        Systemic VTI:  0.17 m Systemic Diam: 2.30 cm  Chilton Si MD Electronically signed by Chilton Si MD Signature Date/Time: 04/26/2021/6:10:51 PM    Final             Risk Assessment/Calculations:             Physical Exam:   VS:  BP 124/82   Pulse 60   Ht 5\' 9"  (1.753 m)   Wt 175 lb 12.8 oz (79.7 kg)   SpO2 97%   BMI 25.96 kg/m    Wt Readings from Last 3 Encounters:  01/29/23 175 lb 12.8 oz (79.7 kg)  01/23/23 174 lb (78.9 kg)  10/19/22 176 lb 6.4 oz (80 kg)    GEN: Well nourished, well developed in no acute distress NECK: No JVD; No carotid bruits CARDIAC: RRR, no murmurs, rubs, gallops RESPIRATORY:  Clear to auscultation without rales, wheezing or rhonchi  ABDOMEN: Soft, non-tender, non-distended EXTREMITIES:  No edema; No deformity   ASSESSMENT AND PLAN: .    Atrial flutter - s/p ablation. No evidence of recurrence. He denies significant palpitations. Continue Metoprolol.   HTN - BP well controlled. Continue current antihypertensive regimen (Metoprolol 100mg  daily, Losartan 100mg  daily).   PAC Quiescent on Toprol 100mg  daily.    Tobacco Abuse - Presently smoking 0.5 PPD. He previously was able to quit with assistance of Chantix and psychology services. He is not quite ready to quit, but will contact us when he wants Chantix refilled.    Aortic atherosclerosis / Coronary artery calcification on CT-CT 07/2022 aortic atherosclerosis and LAD  coronary artery calcification. Stable with no anginal symptoms. No indication for ischemic evaluation.   HLD, LDL goal less than 70/hypertriglyceridemia -  01/23/2023 total cholesterol 90, HDL 32, triglycerides 238, LDL 28. Was not fasting at time of lab work . Update lipid panel when fasting to reassess triglycerides. If not at goal, will plan to increase dose of Rosuvastatin.   DM2 - Continue to follow with PCP.  A1c 6.3.         Dispo: follow up in 1 year  Signed, Alver Sorrow, NP

## 2023-01-29 NOTE — Patient Instructions (Addendum)
Medication Instructions:  Continue your current medications.   *If you need a refill on your cardiac medications before your next appointment, please call your pharmacy*  Lab Work: Your physician recommends that you return for lab work within the next 3 weeks for fasting cholesterol panel.   We want your LDL (triglycerides) to be less than 150. They were 238 on your last check.   Please return for Lab work. You may come to the...   [facility] (3rd floor) [address], [place], [place] [address]  Open: 8am-Noon and 1pm-4:30pm  Please ring the doorbell on the small table when you exit the elevator and the Lab Tech will come get you  [facility] [address], [place], [place] [address] Open: 8am-1pm, then 2pm-4:30pm   [facility]- Please see attached locations sheet stapled to your lab work with address and hours.    If you have labs (blood work) drawn today and your tests are completely normal, you will receive your results only by: MyChart Message (if you have MyChart) OR A paper copy in the mail If you have any lab test that is abnormal or we need to change your treatment, we will call you to review the results.   Testing/Procedures: Your CT scan in March showed plaque build up in one of your heart arteries. This is common to find when people are in their 23s. Your Rosuvastatin and Metoprolol help to prevent this from worsening. Recommend aiming for 150 minutes of moderate intensity activity per week and following a heart healthy diet to help prevent further plaque build up.  Follow-Up: At [facility], you and your health needs are our priority.  As part of our continuing mission to provide you with exceptional heart care, we have created designated Provider Care Teams.  These Care Teams include your primary Cardiologist (physician) and Advanced Practice Providers (APPs -  Physician Assistants and Nurse  Practitioners) who all work together to provide you with the care you need, when you need it.  We recommend signing up for the patient portal called "MyChart".  Sign up information is provided on this After Visit Summary.  MyChart is used to connect with patients for Virtual Visits (Telemedicine).  Patients are able to view lab/test results, encounter notes, upcoming appointments, etc.  Non-urgent messages can be sent to your provider as well.   To learn more about what you can do with MyChart, go to [email or web address].    Your next appointment:   1 year(s)  Provider:   Gillian Shields, NP

## 2023-01-31 LAB — LIPID PANEL
Chol/HDL Ratio: 2.3 {ratio} (ref 0.0–5.0)
Cholesterol, Total: 85 mg/dL — ABNORMAL LOW (ref 100–199)
HDL: 37 mg/dL — ABNORMAL LOW (ref >39)
LDL Chol Calc (NIH): 33 mg/dL (ref 0–99)
Triglycerides: 71 mg/dL (ref 0–149)
VLDL Cholesterol Cal: 15 mg/dL (ref 5–40)

## 2023-02-21 ENCOUNTER — Other Ambulatory Visit: Payer: Self-pay | Admitting: Nurse Practitioner

## 2023-02-21 DIAGNOSIS — E039 Hypothyroidism, unspecified: Secondary | ICD-10-CM

## 2023-03-06 ENCOUNTER — Other Ambulatory Visit: Payer: Self-pay | Admitting: Nurse Practitioner

## 2023-03-06 DIAGNOSIS — E039 Hypothyroidism, unspecified: Secondary | ICD-10-CM

## 2023-03-27 ENCOUNTER — Encounter: Payer: Self-pay | Admitting: Nurse Practitioner

## 2023-03-27 ENCOUNTER — Other Ambulatory Visit: Payer: Self-pay | Admitting: Nurse Practitioner

## 2023-03-27 ENCOUNTER — Other Ambulatory Visit: Payer: Self-pay | Admitting: Internal Medicine

## 2023-03-27 DIAGNOSIS — E1122 Type 2 diabetes mellitus with diabetic chronic kidney disease: Secondary | ICD-10-CM

## 2023-03-27 MED ORDER — METFORMIN HCL ER 750 MG PO TB24: ORAL_TABLET | ORAL | 3 refills | Status: DC

## 2023-03-30 ENCOUNTER — Encounter (HOSPITAL_BASED_OUTPATIENT_CLINIC_OR_DEPARTMENT_OTHER): Payer: Self-pay

## 2023-03-30 DIAGNOSIS — Z720 Tobacco use: Secondary | ICD-10-CM

## 2023-03-30 MED ORDER — VARENICLINE TARTRATE (STARTER) 0.5 MG X 11 & 1 MG X 42 PO TBPK
ORAL_TABLET | ORAL | 0 refills | Status: DC
Start: 2023-03-30 — End: 2023-07-25

## 2023-04-18 ENCOUNTER — Other Ambulatory Visit: Payer: Self-pay | Admitting: Nurse Practitioner

## 2023-04-26 ENCOUNTER — Encounter: Payer: Self-pay | Admitting: Nurse Practitioner

## 2023-04-26 ENCOUNTER — Ambulatory Visit: Payer: 59 | Admitting: Nurse Practitioner

## 2023-04-26 ENCOUNTER — Ambulatory Visit (INDEPENDENT_AMBULATORY_CARE_PROVIDER_SITE_OTHER): Payer: 59 | Admitting: Nurse Practitioner

## 2023-04-26 VITALS — BP 154/80 | HR 71 | Temp 98.1°F | Ht 69.0 in | Wt 161.2 lb

## 2023-04-26 DIAGNOSIS — E039 Hypothyroidism, unspecified: Secondary | ICD-10-CM

## 2023-04-26 DIAGNOSIS — F17200 Nicotine dependence, unspecified, uncomplicated: Secondary | ICD-10-CM

## 2023-04-26 DIAGNOSIS — N182 Chronic kidney disease, stage 2 (mild): Secondary | ICD-10-CM

## 2023-04-26 DIAGNOSIS — Z79899 Other long term (current) drug therapy: Secondary | ICD-10-CM

## 2023-04-26 DIAGNOSIS — Z6823 Body mass index (BMI) 23.0-23.9, adult: Secondary | ICD-10-CM

## 2023-04-26 DIAGNOSIS — E559 Vitamin D deficiency, unspecified: Secondary | ICD-10-CM | POA: Diagnosis not present

## 2023-04-26 DIAGNOSIS — E1169 Type 2 diabetes mellitus with other specified complication: Secondary | ICD-10-CM

## 2023-04-26 DIAGNOSIS — F902 Attention-deficit hyperactivity disorder, combined type: Secondary | ICD-10-CM

## 2023-04-26 DIAGNOSIS — I10 Essential (primary) hypertension: Secondary | ICD-10-CM | POA: Diagnosis not present

## 2023-04-26 DIAGNOSIS — E1122 Type 2 diabetes mellitus with diabetic chronic kidney disease: Secondary | ICD-10-CM

## 2023-04-26 DIAGNOSIS — I491 Atrial premature depolarization: Secondary | ICD-10-CM

## 2023-04-26 DIAGNOSIS — E785 Hyperlipidemia, unspecified: Secondary | ICD-10-CM

## 2023-04-26 NOTE — Patient Instructions (Signed)
 Healthy Eating, Adult Healthy eating may help you get and keep a healthy body weight, reduce the risk of chronic disease, and live a long and productive life. It is important to follow a healthy eating pattern. Your nutritional and calorie needs should be met mainly by different nutrient-rich foods. What are tips for following this plan? Reading food labels Read labels and choose the following: Reduced or low sodium products. Juices with 100% fruit juice. Foods with low saturated fats (<3 g per serving) and high polyunsaturated and monounsaturated fats. Foods with whole grains, such as whole wheat, cracked wheat, brown rice, and wild rice. Whole grains that are fortified with folic acid . This is recommended for females who are pregnant or who want to become pregnant. Read labels and do not eat or drink the following: Foods or drinks with added sugars. These include foods that contain brown sugar, corn sweetener, corn syrup, dextrose , fructose, glucose, high-fructose corn syrup, honey, invert sugar, lactose, malt syrup, maltose, molasses, raw sugar, sucrose, trehalose, or turbinado sugar. Limit your intake of added sugars to less than 10% of your total daily calories. Do not eat more than the following amounts of added sugar per day: 6 teaspoons (25 g) for females. 9 teaspoons (38 g) for males. Foods that contain processed or refined starches and grains. Refined grain products, such as white flour, degermed cornmeal, white bread, and white rice. Shopping Choose nutrient-rich snacks, such as vegetables, whole fruits, and nuts. Avoid high-calorie and high-sugar snacks, such as potato chips, fruit snacks, and candy. Use oil-based dressings and spreads on foods instead of solid fats such as butter, margarine, sour cream, or cream cheese. Limit pre-made sauces, mixes, and instant products such as flavored rice, instant noodles, and ready-made pasta. Try more plant-protein sources, such as tofu,  tempeh, black beans, edamame, lentils, nuts, and seeds. Explore eating plans such as the Mediterranean diet or vegetarian diet. Try heart-healthy dips made with beans and healthy fats like hummus and guacamole. Vegetables go great with these. Cooking Use oil to saut or stir-fry foods instead of solid fats such as butter, margarine, or lard. Try baking, boiling, grilling, or broiling instead of frying. Remove the fatty part of meats before cooking. Steam vegetables in water  or broth. Meal planning  At meals, imagine dividing your plate into fourths: One-half of your plate is fruits and vegetables. One-fourth of your plate is whole grains. One-fourth of your plate is protein, especially lean meats, poultry, eggs, tofu, beans, or nuts. Include low-fat dairy as part of your daily diet. Lifestyle Choose healthy options in all settings, including home, work, school, restaurants, or stores. Prepare your food safely: Wash your hands after handling raw meats. Where you prepare food, keep surfaces clean by regularly washing with hot, soapy water . Keep raw meats separate from ready-to-eat foods, such as fruits and vegetables. Cook seafood, meat, poultry, and eggs to the recommended temperature. Get a food thermometer. Store foods at safe temperatures. In general: Keep cold foods at 34F (4.4C) or below. Keep hot foods at 134F (60C) or above. Keep your freezer at [facility] (-17.8C) or below. Foods are not safe to eat if they have been between the temperatures of 40-134F (4.4-60C) for more than 2 hours. What foods should I eat? Fruits Aim to eat 1-2 cups of fresh, canned (in natural juice), or frozen fruits each day. One cup of fruit equals 1 small apple, 1 large banana, 8 large strawberries, 1 cup (237 g) canned fruit,  cup (82 g) dried fruit,  or 1 cup (240 mL) 100% juice. Vegetables Aim to eat 2-4 cups of fresh and frozen vegetables each day, including different varieties and colors. One cup  of vegetables equals 1 cup (91 g) broccoli or cauliflower florets, 2 medium carrots, 2 cups (150 g) raw, leafy greens, 1 large tomato, 1 large bell pepper, 1 large sweet potato, or 1 medium white potato. Grains Aim to eat 5-10 ounce-equivalents of whole grains each day. Examples of 1 ounce-equivalent of grains include 1 slice of bread, 1 cup (40 g) ready-to-eat cereal, 3 cups (24 g) popcorn, or  cup (93 g) cooked rice. Meats and other proteins Try to eat 5-7 ounce-equivalents of protein each day. Examples of 1 ounce-equivalent of protein include 1 egg,  oz nuts (12 almonds, 24 pistachios, or 7 walnut halves), 1/4 cup (90 g) cooked beans, 6 tablespoons (90 g) hummus or 1 tablespoon (16 g) peanut butter. A cut of meat or fish that is the size of a deck of cards is about 3-4 ounce-equivalents (85 g). Of the protein you eat each week, try to have at least 8 sounce (227 g) of seafood. This is about 2 servings per week. This includes salmon, trout, herring, sardines, and anchovies. Dairy Aim to eat 3 cup-equivalents of fat-free or low-fat dairy each day. Examples of 1 cup-equivalent of dairy include 1 cup (240 mL) milk, 8 ounces (250 g) yogurt, 1 ounces (44 g) natural cheese, or 1 cup (240 mL) fortified soy milk. Fats and oils Aim for about 5 teaspoons (21 g) of fats and oils per day. Choose monounsaturated fats, such as canola and olive oils, mayonnaise made with olive oil or avocado oil, avocados, peanut butter, and most nuts, or polyunsaturated fats, such as sunflower, corn, and soybean oils, walnuts, pine nuts, sesame seeds, sunflower seeds, and flaxseed. Beverages Aim for 6 eight-ounce glasses of water  per day. Limit coffee to 3-5 eight-ounce cups per day. Limit caffeinated beverages that have added calories, such as soda and energy drinks. If you drink alcohol: Limit how much you have to: 0-1 drink a day if you are male. 0-2 drinks a day if you are male. Know how much alcohol is in your drink.  In the [place] one drink is one 12 oz bottle of beer (355 mL), one 5 oz glass of wine (148 mL), or one 1 oz glass of hard liquor (44 mL). Seasoning and other foods Try not to add too much salt to your food. Try using herbs and spices instead of salt. Try not to add sugar to food. This information is based on [place] nutrition guidelines. To learn more, visit DisposableNylon.be. Exact amounts may vary. You may need different amounts. This information is not intended to replace advice given to you by your health care provider. Make sure you discuss any questions you have with your health care provider. Document Revised: 01/09/2022 Document Reviewed: 01/09/2022 Elsevier Patient Education  2024 [employer].

## 2023-04-26 NOTE — Progress Notes (Signed)
 FOLLOW UP  Assessment and Plan:   Hypertension Discussed DASH (Dietary Approaches to Stop Hypertension) DASH diet is lower in sodium than a typical American diet. Cut back on foods that are high in saturated fat, cholesterol, and trans fats. Eat more whole-grain foods, fish, poultry, and nuts Remain active and exercise as tolerated daily.  Monitor BP at home-Call if greater than 130/80.  Check CMP/CBC   Hyperlipidemia associated with T2DM (HCC) Discussed lifestyle modifications. Recommended diet heavy in fruits and veggies, omega 3's. Decrease consumption of animal meats, cheeses, and dairy products. Remain active and exercise as tolerated. Continue to monitor. 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 Discussed lowering current dose of Mounjaro  5 mg to a maintenance dose of Mounjaro  2.5 mg/week Education: Reviewed 'ABCs' of diabetes management  Discussed goals to be met and/or maintained include A1C (<7) Blood pressure (<130/80) Cholesterol (LDL <70) Continue Eye Exam yearly  Continue Dental Exam Q6 mo Discussed dietary recommendations Discussed Physical Activity recommendations Foot exam UTD Check A1C   CKD II associated with T2DM (HCC) Discussed how what you eat and drink can aide in kidney protection. Stay well hydrated. Avoid high salt foods. Avoid NSAIDS. Keep BP and BG well controlled.   Take medications as prescribed. Remain active and exercise as tolerated daily. Maintain weight.  Continue to monitor. Check CMP/GFR/Microablumin   BMI 23 Discussed appropriate BMI Diet modification. Physical activity. Encouraged/praised to build confidence.   Vitamin D  Def At goal at last chec; continue supplementation Defer vit D level to CPE  Hypothyroidism Controlled. Continue Levothyroxine . Reminded to take on an empty stomach 30-100mins before food.  Stop any Biotin Supplement 48-72 hours before  next TSH level to reduce the risk of falsely low TSH levels. Continue to monitor.     Tobacco use Discussed risks associated with tobacco use and advised to reduce or quit Smoking cessation instruction/counseling given:  counseled patient on the dangers of tobacco use, advised patient to stop smoking, and reviewed strategies to maximize success   ADD Continue medications Dexedrine as needed Helps with focus, no AE's; uses rarely The patient was counseled on the addictive nature of the medication and was encouraged to take drug holidays when not needed.   PAC Continue HCTZ 12.5 mg Continue follow up with Dr. Kelsie- cardiology  Medication management All medications discussed and reviewed in full. All questions and concerns regarding medications addressed.     Orders Placed This Encounter  Procedures   CBC with Differential/Platelet   COMPLETE METABOLIC PANEL WITH GFR   Lipid panel   Hemoglobin A1c   TSH   Notify office for further evaluation and treatment, questions or concerns if any reported s/s fail to improve.   The patient was advised to call back or seek an in-person evaluation if any symptoms worsen or if the condition fails to improve as anticipated.   Further disposition pending results of labs. Discussed med's effects and SE's.    I discussed the assessment and treatment plan with the patient. The patient was provided an opportunity to ask questions and all were answered. The patient agreed with the plan and demonstrated an understanding of the instructions.  Discussed med's effects and SE's. Screening labs and tests as requested with regular follow-up as recommended.  I provided 30 minutes of face-to-face time during this encounter including counseling, chart review, and critical decision making was preformed.   Future Appointments  Date Time Provider Department Center  07/26/2023  3:00 PM Tonita Fallow, MD [facility] None     ----------------------------------------------------------------------------------------------------------------------  HPI 63 y.o. male  presents for 3 month follow up on hypertension, cholesterol, T2 diabetes, weight, hypothyroid, tobacco use, hypogonadism on testosterone  supplement, ADD and vitamin D  deficiency.   Overall he is doing well.    He is no longer receiving care from [facility] per personal choice and is going to get all health  maintenance manage and meds refilled here.   he currently is trying to quit smoking.  Has used nicotine patches in the past.     He has COPD per imaging, CXR. Denies coughing, dyspnea, wheezing. Discussed low dose CT, would consider but wants to hold off until CPE.    Patient is on an ADD medication,  15 mg dextroamphetamine PRN; he takes very rarely - typically 1-2 occasions a month. He states that the medication is helping and he denies any adverse reactions.  He was evaluated by  Dr. Kelsie- cardiology on 09/01/21 and was shown to have PAC's. His HCTZ was decreased to 12.5 mg. Atrial flutter resolved post ablation. Has follow up scheduled for 09/29/21. He has noted less PAC's since lowering dose.   BMI is Body mass index is 23.81 kg/m., he has been working on diet but not much exercise.  Wt Readings from Last 3 Encounters:  04/26/23 161 lb 3.2 oz (73.1 kg)  01/29/23 175 lb 12.8 oz (79.7 kg)  01/23/23 174 lb (78.9 kg)   His blood pressure has been controlled at home, today their BP is BP: (!) 154/80  Elevated in clinic today. BP Readings from Last 3 Encounters:  04/26/23 (!) 154/80  01/29/23 124/82  01/23/23 122/72   On losartan  for renal benefits with T2DM.   He does workout. He denies chest pain, shortness of breath, dizziness.   He is not on cholesterol medication, only taking fish oil  and denies myalgias. His LDL cholesterol is at goal, trigs on non-fasting labs are mildly elevated. The cholesterol last visit was:   Lab Results  Component  Value Date   CHOL 85 (L) 01/31/2023   HDL 37 (L) 01/31/2023   LDLCALC 33 01/31/2023   TRIG 71 01/31/2023   CHOLHDL 2.3 01/31/2023    He has been working on diet and exercise for T2DM Recently well controlled on Mounjaro  5 mg and denies increased appetite, nausea, paresthesia of the feet, polydipsia, polyuria, visual disturbances and vomiting.  He admits hasn't checked glucose in a while-  Last A1C in the office was:  Lab Results  Component Value Date   HGBA1C 6.3 (H) 01/23/2023    He has CKD II associated with T2DM monitored at this office:  Lab Results  Component Value Date   EGFR 92 01/23/2023    He is on thyroid  medication. His medication was not changed last visit.   Lab Results  Component Value Date   TSH 1.82 01/23/2023   Patient is on Vitamin D  supplement and at goal at last check:    Lab Results  Component Value Date   VD25OH 71 01/23/2023     He has history of testosterone  deficiency and is on testosterone  replacement, taking 400 mg every 2 weeks, last shot 09/04/21. Is going to stop taking injections  Lab Results  Component Value Date   TESTOSTERONE  105 (L) 01/23/2023     Current Medications:  Current Outpatient Medications on File Prior to Visit  Medication Sig   acetaminophen  (TYLENOL ) 500 MG tablet Take 1,000 mg by  mouth every 6 (six) hours as needed for moderate pain.   Cholecalciferol (VITAMIN D -3) 5000 UNITS TABS Take 5,000 Units by mouth daily.   cyclobenzaprine  (FLEXERIL ) 10 MG tablet Take 1/2  to 1 tablet 3 x / day as needed for Muscle Spasm   levothyroxine  (SYNTHROID ) 50 MCG tablet TAKE 1 TABLET BY MOUTH EVERY DAY BEFORE BREAKFAST   losartan  (COZAAR ) 100 MG tablet Take   1 tablet  Daily  for BP                                                                                                    /                                                                   TAKE                                         BY                                                  MOUTH   metFORMIN  (GLUCOPHAGE -XR) 750 MG 24 hr tablet Take 2 tablets (1,500 mg) Daily  for Diabetes                                /                                                                   TAKE                                         BY                                                 MOUTH   metoprolol  succinate (TOPROL  XL) 100 MG 24 hr tablet Take  1 tablet  every Morning  for BP & Palpitations   naproxen  (NAPROSYN ) 500 MG tablet TAKE 1 TABLET (500 MG TOTAL) BY MOUTH AS NEEDED   omeprazole  (PRILOSEC  OTC) 20 MG tablet Take 20 mg by mouth daily.  ONE TOUCH ULTRA TEST test strip TEST BLOOD SUGAR 3 TIMES A DAY   ONETOUCH DELICA LANCETS 33G MISC USE TO CHECK SUGAR DAILY   rosuvastatin  (CRESTOR ) 10 MG tablet TAKE 1 TABLET BY MOUTH EVERY DAY   tamsulosin  (FLOMAX ) 0.4 MG CAPS capsule TAKE 1 CAPSULE BY MOUTH EVERY DAY   tirzepatide  (MOUNJARO ) 5 MG/0.5ML Pen Inject 5 mg into the skin once a week.   traMADol  (ULTRAM ) 50 MG tablet Take 50 mg by mouth every 6 (six) hours as needed for pain.   Varenicline  Tartrate, Starter, (CHANTIX  STARTING MONTH PAK) 0.5 MG X 11 & 1 MG X 42 TBPK Take one 0.5 mg tablet by mouth once daily for 3 days, Then increase to one 0.5 mg tablet twice daily for 4 days, Then increase to one 1 mg tablet twice daily.   vitamin C (ASCORBIC ACID) 500 MG tablet Take 1,000 mg by mouth daily.   zinc gluconate 50 MG tablet Take 50 mg by mouth daily.   acetaminophen  (TYLENOL  8 HOUR ARTHRITIS PAIN) 650 MG CR tablet Take 650 mg by mouth every 8 (eight) hours as needed for pain. (Patient not taking: Reported on 04/26/2023)   No current facility-administered medications on file prior to visit.     Allergies: No Known Allergies   Medical History:  Past Medical History:  Diagnosis Date   Allergy    Aortic atherosclerosis (HCC)    Atrial flutter (HCC)    s/p ablation   Blood transfusion without reported diagnosis    Chronic kidney disease    kidney stones   COPD (chronic  obstructive pulmonary disease) (HCC)    Coronary artery calcification seen on CAT scan    DDD (degenerative disc disease)    Diabetes mellitus without complication (HCC)    GERD (gastroesophageal reflux disease)    Heart murmur    1971- per pt no issues reported by cardiology about murmur- Echo done   History of kidney stones    Hyperlipidemia    Hypertension    Hypertriglyceridemia    Hypogonadism male    Hypothyroidism    Tobacco use    Vitamin D  deficiency    Family history- Reviewed and unchanged Social history- Reviewed and unchanged   Review of Systems:  Review of Systems  Constitutional:  Negative for malaise/fatigue and weight loss.  HENT:  Negative for hearing loss and tinnitus.   Eyes:  Negative for blurred vision and double vision.  Respiratory:  Negative for cough, shortness of breath and wheezing.   Cardiovascular:  Negative for chest pain, palpitations, orthopnea, claudication and leg swelling.  Gastrointestinal:  Negative for abdominal pain, blood in stool, constipation, diarrhea, heartburn, melena, nausea and vomiting.  Genitourinary: Negative.   Musculoskeletal:  Negative for joint pain and myalgias.  Skin:  Negative for rash.  Neurological:  Negative for dizziness, tingling, sensory change, weakness and headaches.  Endo/Heme/Allergies:  Negative for polydipsia.  Psychiatric/Behavioral: Negative.    All other systems reviewed and are negative.    Physical Exam: BP (!) 154/80   Pulse 71   Temp 98.1 F (36.7 C)   Ht 5' 9 (1.753 m)   Wt 161 lb 3.2 oz (73.1 kg)   SpO2 99%   BMI 23.81 kg/m  Wt Readings from Last 3 Encounters:  04/26/23 161 lb 3.2 oz (73.1 kg)  01/29/23 175 lb 12.8 oz (79.7 kg)  01/23/23 174 lb (78.9 kg)   General Appearance: Well nourished, in no apparent distress. Eyes: PERRLA, EOMs, conjunctiva no swelling  or erythema Sinuses: No Frontal/maxillary tenderness ENT/Mouth: Ext aud canals clear, TMs without erythema, bulging. No  erythema, swelling, or exudate on post pharynx.  Tonsils not swollen or erythematous. Hearing normal.  Neck: Supple, thyroid  normal.  Respiratory: Respiratory effort normal, BS equal bilaterally without rales, rhonchi, wheezing or stridor.  Cardio: RRR with no MRGs. Brisk peripheral pulses without edema.  Abdomen: Soft, + BS.  Non tender, no guarding, rebound, hernias, masses. Lymphatics: Non tender without lymphadenopathy.  Musculoskeletal: Full ROM, 5/5 strength, Normal gait Skin: Warm, dry without rashes, lesions, ecchymosis.  Neuro: Cranial nerves intact. No cerebellar symptoms.  Psych: Awake and oriented X 3, normal affect, Insight and Judgment appropriate.    BASCOM NECESSARY, NP 2:58 PM [facility]

## 2023-04-27 LAB — COMPLETE METABOLIC PANEL WITH GFR
AG Ratio: 2.1 (calc) (ref 1.0–2.5)
ALT: 31 U/L (ref 9–46)
AST: 21 U/L (ref 10–35)
Albumin: 5.1 g/dL (ref 3.6–5.1)
Alkaline phosphatase (APISO): 77 U/L (ref 35–144)
BUN: 15 mg/dL (ref 7–25)
CO2: 27 mmol/L (ref 20–32)
Calcium: 10.2 mg/dL (ref 8.6–10.3)
Chloride: 102 mmol/L (ref 98–110)
Creat: 0.8 mg/dL (ref 0.70–1.35)
Globulin: 2.4 g/dL (ref 1.9–3.7)
Glucose, Bld: 91 mg/dL (ref 65–99)
Potassium: 4.3 mmol/L (ref 3.5–5.3)
Sodium: 139 mmol/L (ref 135–146)
Total Bilirubin: 0.5 mg/dL (ref 0.2–1.2)
Total Protein: 7.5 g/dL (ref 6.1–8.1)
eGFR: 101 mL/min/{1.73_m2} (ref >=60)

## 2023-04-27 LAB — LIPID PANEL
Cholesterol: 96 mg/dL (ref <200)
HDL: 38 mg/dL — ABNORMAL LOW (ref >=40)
LDL Cholesterol (Calc): 36 mg/dL
Non-HDL Cholesterol (Calc): 58 mg/dL (ref <130)
Total CHOL/HDL Ratio: 2.5 (calc) (ref <5.0)
Triglycerides: 134 mg/dL (ref <150)

## 2023-04-27 LAB — CBC WITH DIFFERENTIAL/PLATELET
Absolute Lymphocytes: 3163 {cells}/uL (ref 850–3900)
Absolute Monocytes: 840 {cells}/uL (ref 200–950)
Basophils Absolute: 35 {cells}/uL (ref 0–200)
Basophils Relative: 0.3 %
Eosinophils Absolute: 46 {cells}/uL (ref 15–500)
Eosinophils Relative: 0.4 %
HCT: 43.6 % (ref 38.5–50.0)
Hemoglobin: 14.6 g/dL (ref 13.2–17.1)
MCH: 30.1 pg (ref 27.0–33.0)
MCHC: 33.5 g/dL (ref 32.0–36.0)
MCV: 89.9 fL (ref 80.0–100.0)
MPV: 11.2 fL (ref 7.5–12.5)
Monocytes Relative: 7.3 %
Neutro Abs: 7418 {cells}/uL (ref 1500–7800)
Neutrophils Relative %: 64.5 %
Platelets: 230 10*3/uL (ref 140–400)
RBC: 4.85 10*6/uL (ref 4.20–5.80)
RDW: 12.7 % (ref 11.0–15.0)
Total Lymphocyte: 27.5 %
WBC: 11.5 10*3/uL — ABNORMAL HIGH (ref 3.8–10.8)

## 2023-04-27 LAB — HEMOGLOBIN A1C
Hgb A1c MFr Bld: 6.2 %{Hb} — ABNORMAL HIGH (ref <5.7)
Mean Plasma Glucose: 131 mg/dL
eAG (mmol/L): 7.3 mmol/L

## 2023-04-27 LAB — TSH: TSH: 1.55 m[IU]/L (ref 0.40–4.50)

## 2023-06-23 ENCOUNTER — Other Ambulatory Visit (HOSPITAL_BASED_OUTPATIENT_CLINIC_OR_DEPARTMENT_OTHER): Payer: Self-pay | Admitting: Family

## 2023-07-10 LAB — HM DIABETES EYE EXAM

## 2023-07-24 ENCOUNTER — Telehealth: Payer: Self-pay | Admitting: Family

## 2023-07-24 ENCOUNTER — Other Ambulatory Visit: Payer: Self-pay | Admitting: Family

## 2023-07-24 DIAGNOSIS — I491 Atrial premature depolarization: Secondary | ICD-10-CM

## 2023-07-24 MED ORDER — METOPROLOL SUCCINATE ER 100 MG PO TB24
ORAL_TABLET | ORAL | 3 refills | Status: AC
Start: 2023-07-24 — End: ?

## 2023-07-24 NOTE — Telephone Encounter (Signed)
 Pt c/o medication issue:  1. Name of Medication:   metoprolol succinate (TOPROL XL) 100 MG 24 hr tablet    2. How are you currently taking this medication (dosage and times per day)?   Take 1 tablet every Morning for BP & Palpitations    3. Are you having a reaction (difficulty breathing--STAT)? No  4. What is your medication issue? Patient is needing a refill on this medication, but the original physician that was filling the medication has passed away. Patient would like to know if we can start managing the prescription. Patient requested we responded back through MyChart. Please advise.

## 2023-07-24 NOTE — Telephone Encounter (Signed)
 Rx sent

## 2023-07-25 ENCOUNTER — Encounter (HOSPITAL_BASED_OUTPATIENT_CLINIC_OR_DEPARTMENT_OTHER): Payer: Self-pay

## 2023-07-25 ENCOUNTER — Other Ambulatory Visit (HOSPITAL_BASED_OUTPATIENT_CLINIC_OR_DEPARTMENT_OTHER): Payer: Self-pay

## 2023-07-25 ENCOUNTER — Encounter: Payer: 59 | Admitting: Internal Medicine

## 2023-07-25 DIAGNOSIS — Z720 Tobacco use: Secondary | ICD-10-CM

## 2023-07-25 MED ORDER — VARENICLINE TARTRATE (STARTER) 0.5 MG X 11 & 1 MG X 42 PO TBPK: ORAL_TABLET | ORAL | 0 refills | Status: DC

## 2023-07-26 ENCOUNTER — Encounter: Payer: 59 | Admitting: Internal Medicine

## 2023-08-20 ENCOUNTER — Encounter: Payer: Self-pay | Admitting: Family Medicine

## 2023-08-20 ENCOUNTER — Ambulatory Visit: Payer: 59 | Admitting: Family Medicine

## 2023-08-20 VITALS — BP 128/70 | HR 64 | Temp 98.6°F | Resp 16 | Ht 69.0 in | Wt 172.0 lb

## 2023-08-20 DIAGNOSIS — E1129 Type 2 diabetes mellitus with other diabetic kidney complication: Secondary | ICD-10-CM

## 2023-08-20 DIAGNOSIS — I10 Essential (primary) hypertension: Secondary | ICD-10-CM | POA: Diagnosis not present

## 2023-08-20 DIAGNOSIS — Z7985 Long-term (current) use of injectable non-insulin antidiabetic drugs: Secondary | ICD-10-CM | POA: Diagnosis not present

## 2023-08-20 DIAGNOSIS — E785 Hyperlipidemia, unspecified: Secondary | ICD-10-CM

## 2023-08-20 DIAGNOSIS — Z23 Encounter for immunization: Secondary | ICD-10-CM

## 2023-08-20 DIAGNOSIS — E1169 Type 2 diabetes mellitus with other specified complication: Secondary | ICD-10-CM

## 2023-08-20 DIAGNOSIS — E039 Hypothyroidism, unspecified: Secondary | ICD-10-CM

## 2023-08-20 LAB — MICROALBUMIN / CREATININE URINE RATIO
Creatinine,U: 177.4 mg/dL
Microalb Creat Ratio: 16.4 mg/g (ref 0.0–30.0)
Microalb, Ur: 2.9 mg/dL — ABNORMAL HIGH (ref 0.0–1.9)

## 2023-08-20 MED ORDER — LOSARTAN POTASSIUM 100 MG PO TABS: ORAL_TABLET | ORAL | 2 refills | Status: AC

## 2023-08-20 MED ORDER — MOUNJARO 5 MG/0.5ML ~~LOC~~ SOAJ
5.0000 mg | SUBCUTANEOUS | 1 refills | Status: DC
Start: 2023-08-20 — End: 2023-11-19

## 2023-08-20 NOTE — Progress Notes (Signed)
 HPI: Mr.Paul Gates is a 63 y.o. male with a PMHx significant for HTN, COPD, CKD II, HLD, hypothyroidism, DM II, ADD, vitamin D  deficiency, and DDD, among some, who is here today to establish care.  Former PCP: Vangie Genet, MD Last preventive routine visit: 01/23/2023 He also follows with cardiology and neurosurgery.   Exercise: Patient states he rides his bike outdoors once in awhile.  Diet: He eats out frequently.  Sleep: at least 6 hours per night.  Alcohol Use: He reports he drinks less than 3 drinks per week, including beer, wine, and liquor.  Smoking: He has been smoking since he was 18 and had been smoking 0.5-0.75 (10-15 cigarettes) per day until recently when he has begun trying to quit.  Vision: UTD. He had an appointment in 06/2023.  Dental: UTD on routine dental care.   Chronic medical problems:   Hypertension: dx'ed 10+ years ago Checking BP at home: He checks his BP regularly at home and says his readings are normally at goal.  Medications: Currently on losartan  10 mg daily and metoprolol  succinate 100 mg daily.  Side effects: none Negative for unusual or severe headache, visual changes, exertional chest pain, dyspnea,  focal weakness, or edema.  Lab Results  Component Value Date   CREATININE 0.80 04/26/2023   BUN 15 04/26/2023   NA 139 04/26/2023   K 4.3 04/26/2023   CL 102 04/26/2023   CO2 27 04/26/2023   Hyperlipidemia: Currently on rosuvastatin  10 mg daily.  Side effects from medication: none Lab Results  Component Value Date   CHOL 96 04/26/2023   HDL 38 (L) 04/26/2023   LDLCALC 36 04/26/2023   TRIG 134 04/26/2023   CHOLHDL 2.5 04/26/2023   Diabetes Mellitus II: dx'ed ~2018 - Checking BG at home: He rarely checks his blood sugar at home but says it was in the 90s on 4/26.  - Medications: Currently on Mounjaro  5 mg weekly and metformin  ER 750 mg 2 tabs daily.  - eye exam: 06/2023 - foot exam: performed today - He mentions he has  near-constant numbness on the outside of his right foot.  - Negative for symptoms of hypoglycemia, polyuria, polydipsia, foot ulcers/trauma  Lab Results  Component Value Date   HGBA1C 6.2 (H) 04/26/2023   Lab Results  Component Value Date   MICROALBUR 4.1 07/17/2022   Hypothyroidism:  Currently on levothyroxine  50 mcg daily.  Lab Results  Component Value Date   TSH 1.55 04/26/2023   Chronic pain: OA Currently on naproxen  500 mg daily as needed or OTC ibuprofen.   GERD:  Currently on OTC Omeprazole  20 mg daily. He has a hx of H. Pylori in the duodenum and peptic ulcer disease many years ago.  Smoking cessation/COPD:  Currently on Chantix  1 mg twice daily.  Since he has quit smoking, he has not had problems with cough or wheezing.   He mentions he has a hx of kidney stones but has not had a problem since 2021. He tries to limit his milk intake.  Currently on Flomax  0.4 mg daily. He would like to try to wean it off.    He says he gets up at least once per night to urinate.  Lab Results  Component Value Date   PSA 0.54 07/17/2022   PSA 0.77 06/06/2021   PSA 0.66 05/26/2020   Review of Systems  Constitutional:  Negative for activity change, appetite change and fever.  HENT:  Negative for nosebleeds and sore throat.  Respiratory:  Negative for cough and wheezing.   Gastrointestinal:  Negative for abdominal pain, nausea and vomiting.  Endocrine: Negative for cold intolerance and heat intolerance.  Genitourinary:  Negative for decreased urine volume, dysuria and hematuria.  Musculoskeletal:  Positive for arthralgias and back pain.  Skin:  Negative for rash.  Neurological:  Negative for syncope and facial asymmetry.  Psychiatric/Behavioral:  Negative for confusion. The patient is not nervous/anxious.   See other pertinent positives and negatives in HPI.  Current Outpatient Medications on File Prior to Visit  Medication Sig Dispense Refill   acetaminophen  (TYLENOL  8 HOUR  ARTHRITIS PAIN) 650 MG CR tablet Take 650 mg by mouth every 8 (eight) hours as needed for pain. (Patient not taking: Reported on 04/26/2023)     acetaminophen  (TYLENOL ) 500 MG tablet Take 1,000 mg by mouth every 6 (six) hours as needed for moderate pain.     Cholecalciferol (VITAMIN D -3) 5000 UNITS TABS Take 5,000 Units by mouth daily.     cyclobenzaprine  (FLEXERIL ) 10 MG tablet Take 1/2  to 1 tablet 3 x / day as needed for Muscle Spasm 90 tablet 3   levothyroxine  (SYNTHROID ) 50 MCG tablet TAKE 1 TABLET BY MOUTH EVERY DAY BEFORE BREAKFAST 30 tablet 8   losartan  (COZAAR ) 100 MG tablet Take   1 tablet  Daily  for BP                                                                                                    /                                                                   TAKE                                         BY                                                 MOUTH 90 tablet 3   metFORMIN  (GLUCOPHAGE -XR) 750 MG 24 hr tablet Take 2 tablets (1,500 mg) Daily  for Diabetes                                /  TAKE                                         BY                                                 MOUTH 180 tablet 3   metoprolol  succinate (TOPROL  XL) 100 MG 24 hr tablet Take  1 tablet  every Morning  for BP & Palpitations 90 tablet 3   naproxen  (NAPROSYN ) 500 MG tablet TAKE 1 TABLET (500 MG TOTAL) BY MOUTH AS NEEDED 60 tablet 1   omeprazole  (PRILOSEC  OTC) 20 MG tablet Take 20 mg by mouth daily.     ONE TOUCH ULTRA TEST test strip TEST BLOOD SUGAR 3 TIMES A DAY 100 each 3   ONETOUCH DELICA LANCETS 33G MISC USE TO CHECK SUGAR DAILY 100 each 0   rosuvastatin  (CRESTOR ) 10 MG tablet TAKE 1 TABLET BY MOUTH EVERY DAY 30 tablet 5   tamsulosin  (FLOMAX ) 0.4 MG CAPS capsule TAKE 1 CAPSULE BY MOUTH EVERY DAY 30 capsule 11   tirzepatide  (MOUNJARO ) 5 MG/0.5ML Pen Inject 5 mg into the skin once a week.     traMADol  (ULTRAM ) 50 MG tablet Take  50 mg by mouth every 6 (six) hours as needed for pain.     Varenicline  Tartrate, Starter, (CHANTIX  STARTING MONTH PAK) 0.5 MG X 11 & 1 MG X 42 TBPK Take one 0.5 mg tablet by mouth once daily for 3 days, Then increase to one 0.5 mg tablet twice daily for 4 days, Then increase to one 1 mg tablet twice daily. 53 each 0   vitamin C (ASCORBIC ACID) 500 MG tablet Take 1,000 mg by mouth daily.     zinc gluconate 50 MG tablet Take 50 mg by mouth daily.     No current facility-administered medications on file prior to visit.   Past Medical History:  Diagnosis Date   Allergy    Aortic atherosclerosis (HCC)    Atrial flutter (HCC)    s/p ablation   Blood transfusion without reported diagnosis    Chronic kidney disease    kidney stones   COPD (chronic obstructive pulmonary disease) (HCC)    Coronary artery calcification seen on CAT scan    DDD (degenerative disc disease)    Diabetes mellitus without complication (HCC)    GERD (gastroesophageal reflux disease)    Heart murmur    1971- per pt no issues reported by cardiology about murmur- Echo done   History of kidney stones    Hyperlipidemia    Hypertension    Hypertriglyceridemia    Hypogonadism male    Hypothyroidism    Tobacco use    Vitamin D  deficiency    No Known Allergies  Family History  Problem Relation Age of Onset   Hypertension Mother    Stroke Mother    Cancer Mother        ovarian   Alcohol abuse Father    Hypertension Father    Clotting disorder Father        antiphospholipid syndrome    Diabetes Maternal Uncle    Cancer Maternal Grandmother        lung   Cancer Paternal Grandfather  lung   Colon cancer Neg Hx    Stomach cancer Neg Hx    Esophageal cancer Neg Hx    Colon polyps Neg Hx    Rectal cancer Neg Hx    Social History   Socioeconomic History   Marital status: Single    Spouse name: Not on file   Number of children: 0   Years of education: Not on file   Highest education level: Not on file   Occupational History   Not on file  Tobacco Use   Smoking status: Every Day    Current packs/day: 0.25    Average packs/day: 0.2 packs/day for 45.3 years (11.3 ttl pk-yrs)    Types: Cigarettes    Start date: 19   Smokeless tobacco: Never   Tobacco comments:    Currently down to smoking 0.5 pack   Vaping Use   Vaping status: Never Used  Substance and Sexual Activity   Alcohol use: Yes    Alcohol/week: 4.0 standard drinks of alcohol    Types: 4 Standard drinks or equivalent per week    Comment: socially- 2-3 drinks weekly   Drug use: No   Sexual activity: Not on file  Other Topics Concern   Not on file  Social History Narrative   Lives in [place] and works for [employer] 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   Divorced.  No children.   Social Drivers of Corporate investment banker Strain: Not on file  Food Insecurity: Not on file  Transportation Needs: Not on file  Physical Activity: Not on file  Stress: Not on file  Social Connections: Not on file   Today's Vitals   08/20/23 1346  BP: 128/70  Pulse: 64  Resp: 16  Temp: 98.6 F (37 C)  TempSrc: Oral  SpO2: 98%  Weight: 172 lb (78 kg)  Height: 5\' 9"  (1.753 m)   Body mass index is 25.4 kg/m.  Physical Exam Nursing note reviewed.  Constitutional:      General: He is not in acute distress.    Appearance: He is well-developed.  HENT:     Head: Normocephalic and atraumatic.     Mouth/Throat:     Mouth: Mucous membranes are moist.     Pharynx: Oropharynx is clear. Uvula midline.  Eyes:     Conjunctiva/sclera: Conjunctivae normal.  Cardiovascular:     Rate and Rhythm: Normal rate and regular rhythm.     Pulses:          Dorsalis pedis pulses are 2+ on the right side and 2+ on the left side.     Heart sounds: No murmur heard. Pulmonary:     Effort: Pulmonary effort is normal. No respiratory distress.     Breath sounds: Normal breath sounds.  Abdominal:     Palpations: Abdomen is soft. There is no hepatomegaly  or mass.     Tenderness: There is no abdominal tenderness.  Musculoskeletal:     Right lower leg: No edema.     Left lower leg: No edema.  Lymphadenopathy:     Cervical: No cervical adenopathy.  Skin:    General: Skin is warm.     Findings: No erythema or rash.  Neurological:     Mental Status: He is alert and oriented to person, place, and time.     Cranial Nerves: No cranial nerve deficit.     Gait: Gait normal.  Psychiatric:        Mood and Affect: Mood and affect  normal.   ASSESSMENT AND PLAN:  Mr. Paul Gates was seen today to establish care.   Lab Results  Component Value Date   MICROALBUR 2.9 (H) 08/20/2023   MICROALBUR 4.1 07/17/2022   T2_NIDDM w/Stage 2 CKD (GFR 82 ml/min) Assessment & Plan: HgA1C has been at goal, 6.2 in 04/2023. No changes in current management:Mounjaro  5 mg weekly and metformin  ER 750 mg 2 tabs daily.  Regular exercise and healthy diet with avoidance of added sugar food intake is an important part of treatment and recommended. Annual eye exam, periodic dental and foot care recommended. F/U in 3 months.  Orders: -     Microalbumin / creatinine urine ratio; Future -     Mounjaro ; Inject 5 mg into the skin once a week.  Dispense: 6 mL; Refill: 1  Essential hypertension Assessment & Plan:  BP adequately controlled. Currently on losartan  10 mg daily and metoprolol  succinate 100 mg daily.  Continue low salt diet. Eye exam current, 06/2023.  Orders: -     Losartan  Potassium; Take   1 tablet  Daily  for BP                                                                                                    /                                                                   TAKE                                         BY                                                 MOUTH  Dispense: 90 tablet; Refill: 2  Hyperlipidemia associated with type 2 diabetes mellitus (HCC) Assessment & Plan: Last LDL 36 in 04/2023. Continue Rosuvastatin  10 mg daily and low fat  diet.   Hypothyroidism, acquired Assessment & Plan: Continue Levothyroxine  50 mcg daily. Last TSH normal at 1.5 in 04/2023.   Need for pneumococcal vaccination -     Pneumococcal conjugate vaccine 20-valent  He can start weaning off Flomax  0.4 mg, if urinary frequency gets worse, he can resume it.  Return in about 3 months (around 11/19/2023) for chronic problems.  I, Paul Gates, acting as a scribe for Paul Thong [place], MD., have documented all relevant documentation on the behalf of Paul Klosinski [place], MD, as directed by  Paul Schlotzhauer [place], MD while in the presence of Paul Wiedman [place], MD.   I, Paul Broman [place], MD, have reviewed all documentation for this visit. The documentation on 08/20/23 for the  exam, diagnosis, procedures, and orders are all accurate and complete.  Paul Caraveo G. [place], MD  [facility]. [place] [facility].

## 2023-08-20 NOTE — Patient Instructions (Addendum)
 A few things to remember from today's visit:  T2_NIDDM w/Stage 2 CKD (GFR 82 ml/min) - Plan: Microalbumin / creatinine urine ratio  Essential hypertension  Hyperlipidemia associated with type 2 diabetes mellitus (HCC)  Hypothyroidism, acquired  No changes today.  If you need refills for medications you take chronically, please call your pharmacy. Do not use My Chart to request refills or for acute issues that need immediate attention. If you send a my chart message, it may take a few days to be addressed, specially if I am not in the office.  Please be sure medication list is accurate. If a new problem present, please set up appointment sooner than planned today.

## 2023-08-22 ENCOUNTER — Encounter: Payer: Self-pay | Admitting: Family Medicine

## 2023-08-23 ENCOUNTER — Encounter: Payer: Self-pay | Admitting: Family Medicine

## 2023-08-23 NOTE — Assessment & Plan Note (Signed)
 BP adequately controlled. Currently on losartan  10 mg daily and metoprolol  succinate 100 mg daily.  Continue low salt diet. Eye exam current, 06/2023.

## 2023-08-23 NOTE — Assessment & Plan Note (Signed)
 Continue Levothyroxine  50 mcg daily. Last TSH normal at 1.5 in 04/2023.

## 2023-08-23 NOTE — Assessment & Plan Note (Addendum)
 Last LDL 36 in 04/2023. Continue Rosuvastatin  10 mg daily and low fat diet.

## 2023-08-23 NOTE — Assessment & Plan Note (Signed)
 HgA1C has been at goal, 6.2 in 04/2023. No changes in current management:Mounjaro  5 mg weekly and metformin  ER 750 mg 2 tabs daily.  Regular exercise and healthy diet with avoidance of added sugar food intake is an important part of treatment and recommended. Annual eye exam, periodic dental and foot care recommended. F/U in 3 months.

## 2023-08-27 NOTE — Telephone Encounter (Signed)
 FYI

## 2023-09-03 ENCOUNTER — Encounter: Payer: Self-pay | Admitting: Family Medicine

## 2023-09-05 ENCOUNTER — Other Ambulatory Visit: Payer: Self-pay | Admitting: Family Medicine

## 2023-09-05 DIAGNOSIS — Z87891 Personal history of nicotine dependence: Secondary | ICD-10-CM

## 2023-09-05 DIAGNOSIS — Z419 Encounter for procedure for purposes other than remedying health state, unspecified: Secondary | ICD-10-CM

## 2023-09-19 ENCOUNTER — Ambulatory Visit: Admitting: Family Medicine

## 2023-11-19 ENCOUNTER — Ambulatory Visit: Admitting: Family Medicine

## 2023-11-19 ENCOUNTER — Encounter: Payer: Self-pay | Admitting: Family Medicine

## 2023-11-19 VITALS — BP 120/80 | HR 63 | Resp 16 | Ht 69.0 in | Wt 173.0 lb

## 2023-11-19 DIAGNOSIS — N182 Chronic kidney disease, stage 2 (mild): Secondary | ICD-10-CM

## 2023-11-19 DIAGNOSIS — I10 Essential (primary) hypertension: Secondary | ICD-10-CM | POA: Diagnosis not present

## 2023-11-19 DIAGNOSIS — Z23 Encounter for immunization: Secondary | ICD-10-CM | POA: Diagnosis not present

## 2023-11-19 DIAGNOSIS — E1129 Type 2 diabetes mellitus with other diabetic kidney complication: Secondary | ICD-10-CM

## 2023-11-19 DIAGNOSIS — E1122 Type 2 diabetes mellitus with diabetic chronic kidney disease: Secondary | ICD-10-CM | POA: Diagnosis not present

## 2023-11-19 DIAGNOSIS — Z87442 Personal history of urinary calculi: Secondary | ICD-10-CM | POA: Diagnosis not present

## 2023-11-19 DIAGNOSIS — Z7984 Long term (current) use of oral hypoglycemic drugs: Secondary | ICD-10-CM

## 2023-11-19 LAB — BASIC METABOLIC PANEL WITH GFR
BUN: 24 mg/dL — ABNORMAL HIGH (ref 6–23)
CO2: 28 meq/L (ref 19–32)
Calcium: 9.8 mg/dL (ref 8.4–10.5)
Chloride: 101 meq/L (ref 96–112)
Creatinine, Ser: 1.01 mg/dL (ref 0.40–1.50)
GFR: 80.68 mL/min (ref >60.00)
Glucose, Bld: 68 mg/dL — ABNORMAL LOW (ref 70–99)
Potassium: 4.8 meq/L (ref 3.5–5.1)
Sodium: 137 meq/L (ref 135–145)

## 2023-11-19 LAB — POCT GLYCOSYLATED HEMOGLOBIN (HGB A1C): HbA1c, POC (prediabetic range): 6.3 % (ref 5.7–6.4)

## 2023-11-19 LAB — CBC
HCT: 41 % (ref 39.0–52.0)
Hemoglobin: 13.7 g/dL (ref 13.0–17.0)
MCHC: 33.5 g/dL (ref 30.0–36.0)
MCV: 91.2 fl (ref 78.0–100.0)
Platelets: 213 K/uL (ref 150.0–400.0)
RBC: 4.49 Mil/uL (ref 4.22–5.81)
RDW: 13.9 % (ref 11.5–15.5)
WBC: 12.9 K/uL — ABNORMAL HIGH (ref 4.0–10.5)

## 2023-11-19 MED ORDER — TAMSULOSIN HCL 0.4 MG PO CAPS: 0.4000 mg | ORAL_CAPSULE | Freq: Every day | ORAL | Status: DC | PRN

## 2023-11-19 NOTE — Patient Instructions (Addendum)
 A few things to remember from today's visit:  Type II diabetes mellitus with renal manifestations not at goal [facility]) - Plan: POC HgB A1c  Essential hypertension - Plan: Basic metabolic panel with GFR, CBC  History of kidney stones - Plan: tamsulosin  (FLOMAX ) 0.4 MG CAPS capsule  B12 2000 mcg once per week. No other changes today.  If you need refills for medications you take chronically, please call your pharmacy. Do not use My Chart to request refills or for acute issues that need immediate attention. If you send a my chart message, it may take a few days to be addressed, specially if I am not in the office.  Please be sure medication list is accurate. If a new problem present, please set up appointment sooner than planned today.

## 2023-11-19 NOTE — Progress Notes (Signed)
 HPI: [name] is a 63 y.o. male with a PMHx significant for HTN, COPD, CKD II, HLD, hypothyroidism, DM II, ADD, chronic pain, vitamin D  deficiency, and DDD, among some, who is here today for chronic disease management.  Last seen on 08/20/2023  Hypertension: Medications: Losartan  100 mg once daily and Metoprolol  succinate 100 mg once daily  Side effects: None.  BP readings at home: Not checking.  Pt still smoking 1/2-3/4 ppd. Negative for unusual or severe headache, visual changes, exertional chest pain, dyspnea,  focal weakness, or edema. CKD II: No foam in urine or decreased urine output.  Lab Results  Component Value Date   CREATININE 0.80 04/26/2023   BUN 15 04/26/2023   NA 139 04/26/2023   K 4.3 04/26/2023   CL 102 04/26/2023   CO2 27 04/26/2023   Diabetes Mellitus II: Dx'ed ~2018  Checking BG at home: not checking Medications: Metformin  1,500 once daily.  Stopped taking Mounjaro  5 mg once weekly 1-2 months ago due to sulfur burps and occasional hunger pains.  Good compliance and tolerating well.  Diet: Tries to eat healthier, but messed up Negative for symptoms of hypoglycemia, polyuria, polydipsia, numbness extremities, foot ulcers/trauma.  Lab Results  Component Value Date   HGBA1C 6.2 (H) 04/26/2023   Lab Results  Component Value Date   MICROALBUR 2.9 (H) 08/20/2023   GERD: Pt takes Omeprazole  20 mg daily.  Since his last visit, he stopped Flomax  0.4 mg, which was apparently recommended for acute episode of kidney stone. He has not noted changes in urination since stopping medication.  Review of Systems  Constitutional:  Negative for activity change, appetite change and fever.  HENT:  Negative for mouth sores and sore throat.   Respiratory:  Negative for cough and wheezing.   Gastrointestinal:  Negative for abdominal pain, nausea and vomiting.  Genitourinary:  Negative for dysuria and hematuria.  Skin:  Negative for rash.  Neurological:   Negative for syncope and facial asymmetry.  Psychiatric/Behavioral:  Negative for confusion.   See other pertinent positives and negatives in HPI.  Current Outpatient Medications on File Prior to Visit  Medication Sig Dispense Refill   acetaminophen  (TYLENOL  8 HOUR ARTHRITIS PAIN) 650 MG CR tablet Take 650 mg by mouth every 8 (eight) hours as needed for pain.     acetaminophen  (TYLENOL ) 500 MG tablet Take 1,000 mg by mouth every 6 (six) hours as needed for moderate pain.     Cholecalciferol (VITAMIN D -3) 5000 UNITS TABS Take 5,000 Units by mouth daily.     cyclobenzaprine  (FLEXERIL ) 10 MG tablet Take 1/2  to 1 tablet 3 x / day as needed for Muscle Spasm 90 tablet 3   levothyroxine  (SYNTHROID ) 50 MCG tablet TAKE 1 TABLET BY MOUTH EVERY DAY BEFORE BREAKFAST 30 tablet 8   losartan  (COZAAR ) 100 MG tablet Take   1 tablet  Daily  for BP                                                                                                    /  TAKE                                         BY                                                 MOUTH 90 tablet 2   metFORMIN  (GLUCOPHAGE -XR) 750 MG 24 hr tablet Take 2 tablets (1,500 mg) Daily  for Diabetes                                /                                                                   TAKE                                         BY                                                 MOUTH 180 tablet 3   metoprolol  succinate (TOPROL  XL) 100 MG 24 hr tablet Take  1 tablet  every Morning  for BP & Palpitations 90 tablet 3   naproxen  (NAPROSYN ) 500 MG tablet TAKE 1 TABLET (500 MG TOTAL) BY MOUTH AS NEEDED 60 tablet 1   omeprazole  (PRILOSEC  OTC) 20 MG tablet Take 20 mg by mouth daily.     ONE TOUCH ULTRA TEST test strip TEST BLOOD SUGAR 3 TIMES A DAY 100 each 3   ONETOUCH DELICA LANCETS 33G MISC USE TO CHECK SUGAR DAILY 100 each 0   rosuvastatin  (CRESTOR ) 10 MG tablet TAKE 1 TABLET BY MOUTH EVERY  DAY 30 tablet 5   tamsulosin  (FLOMAX ) 0.4 MG CAPS capsule TAKE 1 CAPSULE BY MOUTH EVERY DAY 30 capsule 11   tirzepatide  (MOUNJARO ) 5 MG/0.5ML Pen Inject 5 mg into the skin once a week. 6 mL 1   Varenicline  Tartrate, Starter, (CHANTIX  STARTING MONTH PAK) 0.5 MG X 11 & 1 MG X 42 TBPK Take one 0.5 mg tablet by mouth once daily for 3 days, Then increase to one 0.5 mg tablet twice daily for 4 days, Then increase to one 1 mg tablet twice daily. 53 each 0   vitamin C (ASCORBIC ACID) 500 MG tablet Take 1,000 mg by mouth daily.     zinc gluconate 50 MG tablet Take 50 mg by mouth daily.     No current facility-administered medications on file prior to visit.    Past Medical History:  Diagnosis Date   Allergy    Aortic atherosclerosis (HCC)    Atrial flutter (HCC)    s/p ablation   Blood transfusion without reported diagnosis    Chronic kidney disease    kidney stones  COPD (chronic obstructive pulmonary disease) (HCC)    Coronary artery calcification seen on CAT scan    DDD (degenerative disc disease)    Diabetes mellitus without complication (HCC)    GERD (gastroesophageal reflux disease)    Heart murmur    1971- per pt no issues reported by cardiology about murmur- Echo done   History of kidney stones    Hyperlipidemia    Hypertension    Hypertriglyceridemia    Hypogonadism male    Hypothyroidism    Tobacco use    Vitamin D  deficiency    No Known Allergies  Social History   Socioeconomic History   Marital status: Single    Spouse name: Not on file   Number of children: 0   Years of education: Not on file   Highest education level: Not on file  Occupational History   Not on file  Tobacco Use   Smoking status: Every Day    Current packs/day: 0.25    Average packs/day: 0.3 packs/day for 45.6 years (11.4 ttl pk-yrs)    Types: Cigarettes    Start date: 48   Smokeless tobacco: Never   Tobacco comments:    Currently down to smoking 0.5 pack   Vaping Use   Vaping status:  Never Used  Substance and Sexual Activity   Alcohol use: Yes    Alcohol/week: 4.0 standard drinks of alcohol    Types: 4 Standard drinks or equivalent per week    Comment: socially- 2-3 drinks weekly   Drug use: No   Sexual activity: Not on file  Other Topics Concern   Not on file  Social History Narrative   Lives in [place] and works for [employer] 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   Divorced.  No children.   Social Drivers of Corporate investment banker Strain: Not on file  Food Insecurity: Not on file  Transportation Needs: Not on file  Physical Activity: Not on file  Stress: Not on file  Social Connections: Not on file   Today's Vitals   11/19/23 1348  BP: 120/80  Pulse: 63  Resp: 16  SpO2: 95%  Weight: 173 lb (78.5 kg)  Height: 5' 9 (1.753 m)   Body mass index is 25.55 kg/m.  Physical Exam Nursing note reviewed.  Constitutional:      General: He is not in acute distress.    Appearance: He is well-developed.  HENT:     Head: Normocephalic and atraumatic.     Mouth/Throat:     Mouth: Mucous membranes are moist.     Pharynx: Oropharynx is clear. Uvula midline.  Eyes:     Conjunctiva/sclera: Conjunctivae normal.  Cardiovascular:     Rate and Rhythm: Normal rate and regular rhythm.     Pulses:          Dorsalis pedis pulses are 2+ on the right side and 2+ on the left side.     Heart sounds: No murmur heard. Pulmonary:     Effort: Pulmonary effort is normal. No respiratory distress.     Breath sounds: Normal breath sounds.  Abdominal:     Palpations: Abdomen is soft. There is no hepatomegaly or mass.     Tenderness: There is no abdominal tenderness.  Musculoskeletal:     Right lower leg: No edema.     Left lower leg: No edema.  Skin:    General: Skin is warm.     Findings: No erythema or rash.  Neurological:  Mental Status: He is alert and oriented to person, place, and time.     Cranial Nerves: No cranial nerve deficit.     Gait: Gait normal.   Psychiatric:        Mood and Affect: Mood and affect normal.   ASSESSMENT AND PLAN: Paul Gates was seen today for chronic  disease r  Orders Placed This Encounter  Procedures   Varicella-zoster vaccine IM   Basic metabolic panel with GFR   CBC   POC HgB A1c   Lab Results  Component Value Date   HGBA1C 6.3 11/19/2023   Lab Results  Component Value Date   NA 137 11/19/2023   CL 101 11/19/2023   K 4.8 11/19/2023   CO2 28 11/19/2023   BUN 24 (H) 11/19/2023   CREATININE 1.01 11/19/2023   GFR 80.68 11/19/2023   CALCIUM  9.8 11/19/2023   ALBUMIN 4.7 01/16/2022   GLUCOSE 68 (L) 11/19/2023   Lab Results  Component Value Date   WBC 12.9 (H) 11/19/2023   HGB 13.7 11/19/2023   HCT 41.0 11/19/2023   MCV 91.2 11/19/2023   PLT 213.0 11/19/2023   Type II diabetes mellitus with renal manifestations not at goal [facility]) Assessment & Plan: HgA1C at goal, 6.3 today. Has not taken Monjaro. Continue Metformin  750 mg bid. Regular exercise and healthy diet with avoidance of added sugar food intake is an important part of treatment and recommended. Annual eye exam, periodic dental and foot care to continue. F/U in 5-6 months.  Orders: -     POCT glycosylated hemoglobin (Hb A1C)  CKD stage 2 due to type 2 diabetes mellitus (HCC) Assessment & Plan: Continue adequate hydration, low salt diet,and caution with NSAID's. He is on Losartan  100 mg daily. Good BP and glucose controlled to prevent progression.   Essential hypertension Assessment & Plan: BP adequately controlled. Continue losartan  100 mg daily and metoprolol  succinate 100 mg daily as well as low salt diet. Eye exam current, 06/2023.  Orders: -     Basic metabolic panel with GFR; Future -     CBC; Future  History of kidney stones Assessment & Plan: Currently asymptomatic. Continue adequate hydration. Flomax  0.4 mg to take as needed during acute episodes. He has not seen his urologist in over a  year.  Orders: -     Tamsulosin  HCl; Take 1 capsule (0.4 mg total) by mouth daily as needed (Kidney stones.).  Need for shingles vaccine -     Varicella-zoster vaccine IM   Return in about 5 months (around 05/02/2024) for chronic problems.  I, Vernell Forest, acting as a scribe for Selmer Adduci [place], MD., have documented all relevant documentation on the behalf of Chaka Boyson [place], MD, as directed by   while in the presence of Lindamarie Maclachlan [place], MD.  I, Renlee Floor [place], MD, have reviewed all documentation for this visit. The documentation on 11/19/23 for the exam, diagnosis, procedures, and orders are all accurate and complete.  Tirzah Fross G. [place], MD  [facility] [place] office

## 2023-11-21 ENCOUNTER — Ambulatory Visit: Admitting: Pulmonary Disease

## 2023-11-21 ENCOUNTER — Encounter (HOSPITAL_BASED_OUTPATIENT_CLINIC_OR_DEPARTMENT_OTHER): Payer: Self-pay

## 2023-11-21 ENCOUNTER — Other Ambulatory Visit (HOSPITAL_BASED_OUTPATIENT_CLINIC_OR_DEPARTMENT_OTHER): Payer: Self-pay | Admitting: Family

## 2023-11-21 ENCOUNTER — Encounter: Payer: Self-pay | Admitting: Pulmonary Disease

## 2023-11-21 VITALS — BP 132/73 | HR 57 | Ht 69.0 in | Wt 172.0 lb

## 2023-11-21 DIAGNOSIS — J432 Centrilobular emphysema: Secondary | ICD-10-CM | POA: Diagnosis not present

## 2023-11-21 DIAGNOSIS — F17210 Nicotine dependence, cigarettes, uncomplicated: Secondary | ICD-10-CM | POA: Diagnosis not present

## 2023-11-21 DIAGNOSIS — R918 Other nonspecific abnormal finding of lung field: Secondary | ICD-10-CM

## 2023-11-21 DIAGNOSIS — Z720 Tobacco use: Secondary | ICD-10-CM

## 2023-11-21 MED ORDER — VARENICLINE TARTRATE (STARTER) 0.5 MG X 11 & 1 MG X 42 PO TBPK: ORAL_TABLET | ORAL | 0 refills | Status: DC

## 2023-11-21 MED ORDER — ALBUTEROL SULFATE HFA 108 (90 BASE) MCG/ACT IN AERS: 2.0000 | INHALATION_SPRAY | Freq: Four times a day (QID) | RESPIRATORY_TRACT | 6 refills | Status: AC | PRN

## 2023-11-21 NOTE — Patient Instructions (Addendum)
 Recommend starting chantix  to help quit smoking  Start nicotine patches 7-14mg  daily  Use mini nicotine lozenges as needed for break through cravings.  Use albuterol  inhaler 1-2 puffs every 4-6 hours as needed  We will schedule you for a CT Chest in early November to follow up on the lung nodules  We will schedule you for pulmonary function tests  Follow up in mid-November

## 2023-11-21 NOTE — Progress Notes (Addendum)
 Subjective:   PATIENT ID: Paul Gates GENDER: male DOB: 1960-09-29, MRN: [id number]   HPI Discussed the use of AI scribe software for clinical note transcription with the patient, who gave verbal consent to proceed.  History of Present Illness   Paul Gates is a 63 year old male with COPD who presents for follow-up of a lung nodule.  A lung nodule was identified on a CT scan in May 2025 at [facility], located in the right upper lung and measuring 6 millimeters. This nodule was not present on a previous scan in 2024. His father had a similar benign lung nodule and was a chain smoker.  He experiences mild upper back soreness and pressure, which he questions might be related to his lung condition. No significant wheezing or shortness of breath, except occasionally in the early morning before waking up. COPD symptoms, such as coughing, tend to worsen in the winter months, although he has not experienced significant coughing recently.  He has a significant smoking history, having smoked a pack a day for over 20 years, and currently smokes 12-15 cigarettes a day. He has previously used Chantix , which he found effective in reducing cravings, and has tried nicotine patches and lozenges. He discusses the challenges of quitting smoking, including dealing with triggers and the addictive nature of nicotine.      Past Medical History:  Diagnosis Date   Allergy    Aortic atherosclerosis    Atrial flutter (HCC)    s/p ablation   Blood transfusion without reported diagnosis    Chronic kidney disease    kidney stones   COPD (chronic obstructive pulmonary disease) (HCC)    Coronary artery calcification seen on CAT scan    DDD (degenerative disc disease)    Diabetes mellitus without complication (HCC)    GERD (gastroesophageal reflux disease)    Heart murmur    1971- per pt no issues reported by cardiology about murmur- Echo done   History of kidney stones     Hyperlipidemia    Hypertension    Hypertriglyceridemia    Hypogonadism male    Hypothyroidism    Tobacco use    Vitamin D  deficiency      Family History  Problem Relation Age of Onset   Hypertension Mother    Stroke Mother    Cancer Mother        ovarian   Alcohol abuse Father    Hypertension Father    Clotting disorder Father        antiphospholipid syndrome    Diabetes Maternal Uncle    Cancer Maternal Grandmother        lung   Cancer Paternal Grandfather        lung   Colon cancer Neg Hx    Stomach cancer Neg Hx    Esophageal cancer Neg Hx    Colon polyps Neg Hx    Rectal cancer Neg Hx      Social History   Socioeconomic History   Marital status: Single    Spouse name: Not on file   Number of children: 0   Years of education: Not on file   Highest education level: Not on file  Occupational History   Not on file  Tobacco Use   Smoking status: Every Day    Current packs/day: 1.00    Average packs/day: 0.8 packs/day for 40.0 years (30.0 ttl pk-yrs)    Types: Cigarettes   Smokeless tobacco: Never  Tobacco comments:    Currently down to smoking 0.5 pack   Vaping Use   Vaping status: Never Used  Substance and Sexual Activity   Alcohol use: Yes    Alcohol/week: 4.0 standard drinks of alcohol    Types: 4 Standard drinks or equivalent per week    Comment: socially- 2-3 drinks weekly   Drug use: No   Sexual activity: Not on file  Other Topics Concern   Not on file  Social History Narrative   Lives in [place] and works for [employer] 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   Divorced.  No children.   Social Drivers of Corporate investment banker Strain: Not on file  Food Insecurity: Not on file  Transportation Needs: Not on file  Physical Activity: Not on file  Stress: Not on file  Social Connections: Not on file  Intimate Partner Violence: Not on file     No Known Allergies   Outpatient Medications Prior to Visit  Medication Sig Dispense Refill    acetaminophen  (TYLENOL  8 HOUR ARTHRITIS PAIN) 650 MG CR tablet Take 650 mg by mouth every 8 (eight) hours as needed for pain.     acetaminophen  (TYLENOL ) 500 MG tablet Take 1,000 mg by mouth every 6 (six) hours as needed for moderate pain.     Cholecalciferol (VITAMIN D -3) 5000 UNITS TABS Take 5,000 Units by mouth daily.     cyclobenzaprine  (FLEXERIL ) 10 MG tablet Take 1/2  to 1 tablet 3 x / day as needed for Muscle Spasm 90 tablet 3   losartan  (COZAAR ) 100 MG tablet Take   1 tablet  Daily  for BP                                                                                                    /                                                                   TAKE                                         BY                                                 MOUTH 90 tablet 2   metFORMIN  (GLUCOPHAGE -XR) 750 MG 24 hr tablet Take 2 tablets (1,500 mg) Daily  for Diabetes                                /  TAKE                                         BY                                                 MOUTH 180 tablet 3   metoprolol  succinate (TOPROL  XL) 100 MG 24 hr tablet Take  1 tablet  every Morning  for BP & Palpitations 90 tablet 3   omeprazole  (PRILOSEC  OTC) 20 MG tablet Take 20 mg by mouth daily.     ONE TOUCH ULTRA TEST test strip TEST BLOOD SUGAR 3 TIMES A DAY 100 each 3   ONETOUCH DELICA LANCETS 33G MISC USE TO CHECK SUGAR DAILY 100 each 0   tamsulosin  (FLOMAX ) 0.4 MG CAPS capsule Take 1 capsule (0.4 mg total) by mouth daily as needed (Kidney stones.).     vitamin C (ASCORBIC ACID) 500 MG tablet Take 1,000 mg by mouth daily.     zinc gluconate 50 MG tablet Take 50 mg by mouth daily.     levothyroxine  (SYNTHROID ) 50 MCG tablet TAKE 1 TABLET BY MOUTH EVERY DAY BEFORE BREAKFAST 30 tablet 8   rosuvastatin  (CRESTOR ) 10 MG tablet TAKE 1 TABLET BY MOUTH EVERY DAY 30 tablet 5   Varenicline  Tartrate, Starter, (CHANTIX  STARTING MONTH PAK) 0.5 MG  X 11 & 1 MG X 42 TBPK Take one 0.5 mg tablet by mouth once daily for 3 days, Then increase to one 0.5 mg tablet twice daily for 4 days, Then increase to one 1 mg tablet twice daily. 53 each 0   Varenicline  Tartrate, Starter, 0.5 MG X 11 & 1 MG X 42 TBPK TAKE ONE 0.5 MG TABLET BY MOUTH ONCE DAILY FOR 3 DAYS, THEN INCREASE TO ONE 0.5 MG TABLET TWICE DAILY FOR 4 DAYS, THEN INCREASE TO ONE 1 MG TABLET TWICE DAILY. 53 each 0   No facility-administered medications prior to visit.   Review of Systems  Constitutional:  Negative for chills, fever, malaise/fatigue and weight loss.  HENT:  Negative for congestion, sinus pain and sore throat.   Eyes: Negative.   Respiratory:  Negative for cough, hemoptysis, sputum production, shortness of breath and wheezing.   Cardiovascular:  Negative for chest pain, palpitations, orthopnea, claudication and leg swelling.  Gastrointestinal:  Negative for abdominal pain, heartburn, nausea and vomiting.  Genitourinary: Negative.   Musculoskeletal:  Negative for joint pain and myalgias.  Skin:  Negative for rash.  Neurological:  Negative for weakness.  Endo/Heme/Allergies: Negative.   Psychiatric/Behavioral: Negative.     Objective:   Vitals:   11/21/23 1456  BP: 132/73  Pulse: (!) 57  SpO2: 96%  Weight: 172 lb (78 kg)  Height: 5' 9 (1.753 m)   Physical Exam Constitutional:      General: He is not in acute distress.    Appearance: Normal appearance.  Eyes:     General: No scleral icterus.    Conjunctiva/sclera: Conjunctivae normal.  Cardiovascular:     Rate and Rhythm: Normal rate and regular rhythm.  Pulmonary:     Breath sounds: No wheezing, rhonchi or rales.  Musculoskeletal:     Right lower leg: No edema.     Left lower leg: No edema.  Skin:  General: Skin is warm and dry.  Neurological:     General: No focal deficit present.    CBC    Component Value Date/Time   WBC 12.9 (H) 11/19/2023 1452   RBC 4.49 11/19/2023 1452   HGB 13.7  11/19/2023 1452   HGB 17.8 (H) 01/12/2021 1316   HCT 41.0 11/19/2023 1452   HCT 52.9 (H) 01/12/2021 1316   PLT 213.0 11/19/2023 1452   PLT 285 01/12/2021 1316   MCV 91.2 11/19/2023 1452   MCV 88 01/12/2021 1316   MCH 30.1 04/26/2023 1512   MCHC 33.5 11/19/2023 1452   RDW 13.9 11/19/2023 1452   RDW 13.4 01/12/2021 1316   LYMPHSABS 3,382 01/23/2023 1421   LYMPHSABS 2.9 01/12/2021 1316   MONOABS 0.5 04/04/2019 0336   EOSABS 46 04/26/2023 1512   EOSABS 0.1 01/12/2021 1316   BASOSABS 35 04/26/2023 1512   BASOSABS 0.1 01/12/2021 1316   Chest imaging: LCS CT Chest 08/2023 - [facility] Nodule assessment:  1.  There is a 6 mm nodule in the anterior inferior right upper lobe, series 4 image 489.  2.  A 4 mm nodular density in the right middle lobe, image 764.  3.  A 6 mm nodule along the left major fissure, image 640.  4.  A small perifissural nodule in the anterior left lung base, image 810.   LCS CT 07/23/22  1. Lung-RADS 2, benign appearance or behavior. Continue annual screening with low-dose chest CT without contrast in 12 months. 2. Mild thoracic adenopathy, favored to be reactive. Recommend attention on follow-up. 3. Hepatic steatosis. 4. Aortic atherosclerosis (ICD10-I70.0), coronary artery atherosclerosis and emphysema (ICD10-J43.9).  PFT:     No data to display         Labs:  Path:  Echo:  Heart Catheterization:   Assessment & Plan:   Pulmonary nodules - Plan: CT CHEST LCS NODULE F/U LOW DOSE WO CONTRAST  Centrilobular emphysema (HCC) - Plan: Pulmonary Function Test, albuterol  (VENTOLIN  HFA) 108 (90 Base) MCG/ACT inhaler Assessment and Plan    Right upper lobe pulmonary nodule  A 6 mm nodule in the right upper lobe, new since 2024, requires monitoring due to potential malignancy risk, especially given smoking history. - Order follow-up CT scan in early November 2025. - Schedule follow-up appointment in mid-November 2025 to review CT results.  Centrilobular  Emphysema - Order pulmonary function tests to evaluate COPD. - Prescribe albuterol  inhaler for wheezing, to be used as needed, 1-2 puffs every 4-6 hours.  Nicotine dependence, current Long-term smoker with 30 pack year smoking history, currently 12-15 cigarettes/day. Previously used Chantix  successfully. Motivated to quit, aware of health risks. Discussed Chantix  side effects and alternatives. Discussed smoking cessation for 5 minutes. - Recommend restarting Chantix , which is available at the pharmacy. - Suggest using nicotine patches (7 or 14 mg) in conjunction with Chantix . - Advise having nicotine lozenges available for breakthrough cravings.      Follow up in mid-November  Dorn Chill, MD [facility] Office: [phone]   Current Outpatient Medications:    acetaminophen  (TYLENOL  8 HOUR ARTHRITIS PAIN) 650 MG CR tablet, Take 650 mg by mouth every 8 (eight) hours as needed for pain., Disp: , Rfl:    acetaminophen  (TYLENOL ) 500 MG tablet, Take 1,000 mg by mouth every 6 (six) hours as needed for moderate pain., Disp: , Rfl:    albuterol  (VENTOLIN  HFA) 108 (90 Base) MCG/ACT inhaler, Inhale 2 puffs into the lungs every 6 (six) hours as  needed for wheezing or shortness of breath., Disp: 8 g, Rfl: 6   Cholecalciferol (VITAMIN D -3) 5000 UNITS TABS, Take 5,000 Units by mouth daily., Disp: , Rfl:    cyclobenzaprine  (FLEXERIL ) 10 MG tablet, Take 1/2  to 1 tablet 3 x / day as needed for Muscle Spasm, Disp: 90 tablet, Rfl: 3   losartan  (COZAAR ) 100 MG tablet, Take   1 tablet  Daily  for BP                                                                                                    /                                                                   TAKE                                         BY                                                 MOUTH, Disp: 90 tablet, Rfl: 2   metFORMIN  (GLUCOPHAGE -XR) 750 MG 24 hr tablet, Take 2 tablets (1,500 mg) Daily  for Diabetes                                 /                                                                   TAKE                                         BY                                                 MOUTH, Disp: 180 tablet, Rfl: 3   metoprolol  succinate (TOPROL  XL) 100 MG 24 hr tablet, Take  1 tablet  every Morning  for BP & Palpitations, Disp: 90 tablet, Rfl: 3   omeprazole  (PRILOSEC  OTC) 20 MG tablet, Take 20 mg by mouth daily., Disp: , Rfl:    ONE TOUCH ULTRA  TEST test strip, TEST BLOOD SUGAR 3 TIMES A DAY, Disp: 100 each, Rfl: 3   ONETOUCH DELICA LANCETS 33G MISC, USE TO CHECK SUGAR DAILY, Disp: 100 each, Rfl: 0   tamsulosin  (FLOMAX ) 0.4 MG CAPS capsule, Take 1 capsule (0.4 mg total) by mouth daily as needed (Kidney stones.)., Disp: , Rfl:    vitamin C (ASCORBIC ACID) 500 MG tablet, Take 1,000 mg by mouth daily., Disp: , Rfl:    zinc gluconate 50 MG tablet, Take 50 mg by mouth daily., Disp: , Rfl:    levothyroxine  (SYNTHROID ) 50 MCG tablet, Take 1 tablet (50 mcg total) by mouth daily before breakfast., Disp: 90 tablet, Rfl: 3   rosuvastatin  (CRESTOR ) 10 MG tablet, TAKE 1 TABLET BY MOUTH EVERY DAY, Disp: 90 tablet, Rfl: 0   varenicline  (CHANTIX ) 1 MG tablet, Take 1 tablet (1 mg total) by mouth 2 (two) times daily., Disp: 180 tablet, Rfl: 0

## 2023-11-22 ENCOUNTER — Ambulatory Visit: Payer: Self-pay | Admitting: Family Medicine

## 2023-11-22 ENCOUNTER — Encounter: Payer: Self-pay | Admitting: Pulmonary Disease

## 2023-11-22 NOTE — Assessment & Plan Note (Signed)
 HgA1C at goal, 6.3 today. Has not taken Monjaro. Continue Metformin  750 mg bid. Regular exercise and healthy diet with avoidance of added sugar food intake is an important part of treatment and recommended. Annual eye exam, periodic dental and foot care to continue. F/U in 5-6 months.

## 2023-11-22 NOTE — Assessment & Plan Note (Signed)
 Currently asymptomatic. Continue adequate hydration. Flomax  0.4 mg to take as needed during acute episodes. He has not seen his urologist in over a year.

## 2023-11-22 NOTE — Assessment & Plan Note (Signed)
 Continue adequate hydration, low salt diet,and caution with NSAID's. He is on Losartan  100 mg daily. Good BP and glucose controlled to prevent progression.

## 2023-11-22 NOTE — Assessment & Plan Note (Signed)
 BP adequately controlled. Continue losartan  100 mg daily and metoprolol  succinate 100 mg daily as well as low salt diet. Eye exam current, 06/2023.

## 2023-12-19 ENCOUNTER — Other Ambulatory Visit (HOSPITAL_BASED_OUTPATIENT_CLINIC_OR_DEPARTMENT_OTHER): Payer: Self-pay | Admitting: Family

## 2023-12-25 ENCOUNTER — Other Ambulatory Visit (HOSPITAL_BASED_OUTPATIENT_CLINIC_OR_DEPARTMENT_OTHER): Payer: Self-pay | Admitting: Family

## 2023-12-25 ENCOUNTER — Encounter: Payer: Self-pay | Admitting: Family Medicine

## 2023-12-25 ENCOUNTER — Encounter (HOSPITAL_BASED_OUTPATIENT_CLINIC_OR_DEPARTMENT_OTHER): Payer: Self-pay

## 2023-12-25 DIAGNOSIS — Z720 Tobacco use: Secondary | ICD-10-CM

## 2023-12-25 DIAGNOSIS — E039 Hypothyroidism, unspecified: Secondary | ICD-10-CM

## 2023-12-25 MED ORDER — VARENICLINE TARTRATE 1 MG PO TABS: 1.0000 mg | ORAL_TABLET | Freq: Two times a day (BID) | ORAL | 0 refills | Status: AC

## 2023-12-25 MED ORDER — LEVOTHYROXINE SODIUM 50 MCG PO TABS: 50.0000 ug | ORAL_TABLET | Freq: Every day | ORAL | 3 refills | Status: AC

## 2024-02-06 ENCOUNTER — Telehealth: Payer: Self-pay

## 2024-02-06 NOTE — Telephone Encounter (Signed)
 Patient follows with pulmonologist for lung nodules, last seen in 10/2023, so he needs to contact their office. Thanks, Paul Gates

## 2024-02-06 NOTE — Telephone Encounter (Signed)
 Copied from CRM #[id number]. Topic: General - Other >> Feb 06, 2024 11:27 AM Paul Gates wrote: Reason for CRM: [facility] sent patient a letter stating the office needs to set up a 6 month follow up for a new Ct scan of his lungs... Their number [phone].

## 2024-02-07 ENCOUNTER — Telehealth: Payer: Self-pay

## 2024-02-07 NOTE — Telephone Encounter (Signed)
 Spoke with patient. Advised patient of Dr. Gib response. Patient states he will follow up with Dr. Kara. [facility] phone number given to patient.

## 2024-02-08 ENCOUNTER — Encounter: Payer: Self-pay | Admitting: Pulmonary Disease

## 2024-02-08 ENCOUNTER — Telehealth: Payer: Self-pay | Admitting: Pulmonary Disease

## 2024-02-08 NOTE — Telephone Encounter (Signed)
 I have updated his last office note to reflext he is at least a 30 pack year smoker. This is a LCS nodule follow up scan. He had LCS scan by his primary care team, we will be taking that over.  Thanks, JD

## 2024-02-08 NOTE — Telephone Encounter (Signed)
 Patient wanted his CT to be scheduled for early Nov. I called to schedule it and they could not schedule due to the patient smoking less than 25 packs of cig./per year. Please advise.

## 2024-02-11 NOTE — Telephone Encounter (Signed)
 CT is booked. NFN

## 2024-02-26 ENCOUNTER — Encounter (HOSPITAL_COMMUNITY): Payer: Self-pay

## 2024-02-26 ENCOUNTER — Ambulatory Visit (HOSPITAL_COMMUNITY)
Admission: RE | Admit: 2024-02-26 | Discharge: 2024-02-26 | Disposition: A | Discharge status: Home / Self Care | Source: Ambulatory Visit | Attending: Pulmonary Disease | Admitting: Pulmonary Disease

## 2024-02-26 DIAGNOSIS — R918 Other nonspecific abnormal finding of lung field: Secondary | ICD-10-CM | POA: Diagnosis present

## 2024-03-10 ENCOUNTER — Encounter: Payer: Self-pay | Admitting: Family Medicine

## 2024-03-11 ENCOUNTER — Other Ambulatory Visit: Payer: Self-pay | Admitting: Family Medicine

## 2024-03-11 DIAGNOSIS — Z87442 Personal history of urinary calculi: Secondary | ICD-10-CM

## 2024-03-11 MED ORDER — TAMSULOSIN HCL 0.4 MG PO CAPS: 0.4000 mg | ORAL_CAPSULE | Freq: Every day | ORAL | 1 refills | Status: AC | PRN

## 2024-03-22 ENCOUNTER — Other Ambulatory Visit (HOSPITAL_BASED_OUTPATIENT_CLINIC_OR_DEPARTMENT_OTHER): Payer: Self-pay | Admitting: Family

## 2024-03-26 ENCOUNTER — Ambulatory Visit: Payer: Self-pay | Admitting: Pulmonary Disease

## 2024-04-03 ENCOUNTER — Other Ambulatory Visit: Payer: Self-pay | Admitting: Family Medicine

## 2024-04-03 DIAGNOSIS — E1122 Type 2 diabetes mellitus with diabetic chronic kidney disease: Secondary | ICD-10-CM

## 2024-04-03 NOTE — Telephone Encounter (Unsigned)
 Copied from CRM #[id number]. Topic: Clinical - Medication Refill >> Apr 03, 2024  8:21 AM Paul Gates wrote: Medication: metFORMIN  (GLUCOPHAGE -XR) 750 MG 24 hr tablet  Has the patient contacted their pharmacy? No (Agent: If no, request that the patient contact the pharmacy for the refill. If patient does not wish to contact the pharmacy document the reason why and proceed with request.) (Agent: If yes, when and what did the pharmacy advise?) no refills  This is the patient's preferred pharmacy:  [id number] - [place], [place] - [address] AT [address] OF [facility] [address] [place] [address] Phone: [phone] Fax: [phone]  Is this the correct pharmacy for this prescription? Yes If no, delete pharmacy and type the correct one.   Has the prescription been filled recently? No  Is the patient out of the medication? Yes  Has the patient been seen for an appointment in the last year OR does the patient have an upcoming appointment? Yes  Can we respond through MyChart? Yes  Agent: Please be advised that Rx refills may take up to 3 business days. We ask that you follow-up with your pharmacy.

## 2024-04-04 MED ORDER — METFORMIN HCL ER 750 MG PO TB24: ORAL_TABLET | ORAL | 3 refills | Status: AC

## 2024-04-07 ENCOUNTER — Ambulatory Visit: Admitting: Family Medicine

## 2024-04-07 ENCOUNTER — Encounter: Payer: Self-pay | Admitting: Family Medicine

## 2024-04-07 VITALS — BP 150/60 | HR 72 | Temp 97.7°F | Resp 16 | Ht 69.0 in | Wt 178.8 lb

## 2024-04-07 DIAGNOSIS — Z7984 Long term (current) use of oral hypoglycemic drugs: Secondary | ICD-10-CM

## 2024-04-07 DIAGNOSIS — E1129 Type 2 diabetes mellitus with other diabetic kidney complication: Secondary | ICD-10-CM

## 2024-04-07 DIAGNOSIS — E039 Hypothyroidism, unspecified: Secondary | ICD-10-CM | POA: Diagnosis not present

## 2024-04-07 DIAGNOSIS — Z23 Encounter for immunization: Secondary | ICD-10-CM

## 2024-04-07 DIAGNOSIS — R351 Nocturia: Secondary | ICD-10-CM | POA: Diagnosis not present

## 2024-04-07 DIAGNOSIS — E1169 Type 2 diabetes mellitus with other specified complication: Secondary | ICD-10-CM | POA: Diagnosis not present

## 2024-04-07 DIAGNOSIS — I10 Essential (primary) hypertension: Secondary | ICD-10-CM

## 2024-04-07 DIAGNOSIS — N401 Enlarged prostate with lower urinary tract symptoms: Secondary | ICD-10-CM | POA: Insufficient documentation

## 2024-04-07 DIAGNOSIS — M48062 Spinal stenosis, lumbar region with neurogenic claudication: Secondary | ICD-10-CM | POA: Diagnosis not present

## 2024-04-07 DIAGNOSIS — E785 Hyperlipidemia, unspecified: Secondary | ICD-10-CM | POA: Diagnosis not present

## 2024-04-07 MED ORDER — METHOCARBAMOL 500 MG PO TABS: 500.0000 mg | ORAL_TABLET | Freq: Three times a day (TID) | ORAL | 1 refills | Status: AC | PRN

## 2024-04-07 NOTE — Assessment & Plan Note (Signed)
 Last TSH normal at 1.5 in 04/2023. Continue levothyroxine  50 mcg daily. Further recommendation will be given according to TSI result.

## 2024-04-07 NOTE — Patient Instructions (Addendum)
 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 Similar side effects. Rest unchanged. Monitor blood pressure at home. It should be 130/80 or less.  If you need refills for medications you take chronically, please call your pharmacy. Do not use My Chart to request refills or for acute issues that need immediate attention. If you send a my chart message, it may take a few days to be addressed, specially if I am not in the office.  Please be sure medication list is accurate. If a new problem present, please set up appointment sooner than planned today.

## 2024-04-07 NOTE — Progress Notes (Unsigned)
 Chief Complaint  Patient presents with   chronic problems   Discussed the use of AI scribe software for clinical note transcription with the patient, who gave verbal consent to proceed. History of Present Illness Paul Gates is a 63 year old male a PMHx significant for HTN, COPD, CKD II, HLD, hypothyroidism, DM II, ADD, chronic pain, vitamin D  deficiency, and DDD here today for follow up. He was last seen on 11/19/23.  Chronic back pain: He follows with neurosurgeon.  Right-sided dull, achy pain radiated to buttock.  + Numbness and tingling in his right foot.  He performs stretching exercises.  He reports drowsiness with Flexeril  and is interested in methocarbamol  for muscle spasms.He ahs taken this medication in the past.  He also has hx of cervical pain with occasional radiculopathy like symptoms. S/P  cervical spine fusion 30 years ago. He experiences numbness in his neck and jaw, intermittent, exacerbated by certain positions.  -BPH with urinary frequency and nocturia: Resumed tamsulosin  (Flomax ) 0.4 mg daily, symptoms greatly improved. No new urinary symptoms are present, and he denies seeing blood in his urine.  Hypertension: He is on Losartan  100 mg daily and Metoprolol  succinate 100 mg daily. He does not routinely check his blood pressure at home and attributes today's elevated reading to rushing before the appointment.  Negative for unusual or severe headache, visual changes, exertional chest pain, dyspnea,  focal weakness, or edema.  Lab Results  Component Value Date   CREATININE 1.01 11/19/2023   BUN 24 (H) 11/19/2023   NA 137 11/19/2023   K 4.8 11/19/2023   CL 101 11/19/2023   CO2 28 11/19/2023   Diabetes Mellitus II: Diagnosed around 2018. Currently he is on metformin  ER (Glucophage  XR) 750 mg 2 tablets daily. He does not routinely check his blood sugar at home. Negative for symptoms of hypoglycemia, polyuria, polydipsia, numbness extremities, foot  ulcers/trauma  Lab Results  Component Value Date   HGBA1C 6.3 11/19/2023   Lab Results  Component Value Date   MICROALBUR 2.9 (H) 08/20/2023   Hyperlipidemia: Currently on rosuvastatin  10 mg daily. Lab Results  Component Value Date   CHOL 96 04/26/2023   HDL 38 (L) 04/26/2023   LDLCALC 36 04/26/2023   TRIG 134 04/26/2023   CHOLHDL 2.5 04/26/2023   Hypothyroidism:He is on Levothyroxine  50 mcg daily. Lab Results  Component Value Date   TSH 1.55 04/26/2023   Review of Systems  Constitutional:  Negative for activity change, appetite change, chills and fever.  HENT:  Negative for sore throat.   Respiratory:  Negative for cough and wheezing.   Gastrointestinal:  Negative for abdominal pain, nausea and vomiting.  Endocrine: Negative for cold intolerance and heat intolerance.  Genitourinary:  Negative for decreased urine volume, dysuria and hematuria.  Skin:  Negative for rash.  Neurological:  Negative for syncope and facial asymmetry.  See other pertinent positives and negatives in HPI.  Medications Ordered Prior to Encounter[1]  Past Medical History:  Diagnosis Date   Allergy    Aortic atherosclerosis    Atrial flutter (HCC)    s/p ablation   Blood transfusion without reported diagnosis    Chronic kidney disease    kidney stones   COPD (chronic obstructive pulmonary disease) (HCC)    Coronary artery calcification seen on CAT scan    DDD (degenerative disc disease)    Diabetes mellitus without complication (HCC)    GERD (gastroesophageal reflux disease)    Heart murmur  1971- per pt no issues reported by cardiology about murmur- Echo done   History of kidney stones    Hyperlipidemia    Hypertension    Hypertriglyceridemia    Hypogonadism male    Hypothyroidism    Tobacco use    Vitamin D  deficiency     Allergies[2]  Social History   Socioeconomic History   Marital status: Single    Spouse name: Not on file   Number of children: 0   Years of education:  Not on file   Highest education level: Not on file  Occupational History   Not on file  Tobacco Use   Smoking status: Every Day    Current packs/day: 0.50    Average packs/day: 0.8 packs/day for 40.0 years (30.0 ttl pk-yrs)    Types: Cigarettes   Smokeless tobacco: Never   Tobacco comments:    Currently down to smoking 0.5 pack   Vaping Use   Vaping status: Never Used  Substance and Sexual Activity   Alcohol use: Yes    Alcohol/week: 4.0 standard drinks of alcohol    Types: 4 Standard drinks or equivalent per week    Comment: socially- 2-3 drinks weekly   Drug use: No   Sexual activity: Not on file  Other Topics Concern   Not on file  Social History Narrative   Lives in [place] and works for [employer] 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   Divorced.  No children.   Social Drivers of Health   Tobacco Use: High Risk (04/07/2024)   Patient History    Smoking Tobacco Use: Every Day    Smokeless Tobacco Use: Never    Passive Exposure: Not on file  Financial Resource Strain: Not on file  Food Insecurity: Not on file  Transportation Needs: Not on file  Physical Activity: Not on file  Stress: Not on file  Social Connections: Not on file  Depression (PHQ2-9): Low Risk (04/07/2024)   Depression (PHQ2-9)    PHQ-2 Score: 0  Alcohol Screen: Not on file  Housing: Not on file  Utilities: Not on file  Health Literacy: Not on file    Today's Vitals   04/07/24 1433 04/07/24 1437  BP: (!) 150/70 (!) 150/60  Pulse: 72   Resp: 16   Temp: 97.7 F (36.5 C)   SpO2: 98%   Weight: 178 lb 12.8 oz (81.1 kg)   Height: 5' 9 (1.753 m)    Body mass index is 26.4 kg/m.  Physical Exam Vitals and nursing note reviewed.  Constitutional:      General: He is not in acute distress.    Appearance: He is well-developed.  HENT:     Head: Normocephalic and atraumatic.     Mouth/Throat:     Mouth: Mucous membranes are moist.     Pharynx: Oropharynx is clear. Uvula midline.  Eyes:      Conjunctiva/sclera: Conjunctivae normal.  Cardiovascular:     Rate and Rhythm: Normal rate and regular rhythm.     Pulses:          Dorsalis pedis pulses are 2+ on the right side and 2+ on the left side.     Heart sounds: No murmur heard. Pulmonary:     Effort: Pulmonary effort is normal. No respiratory distress.     Breath sounds: Normal breath sounds.  Abdominal:     Palpations: Abdomen is soft. There is no hepatomegaly or mass.     Tenderness: There is no abdominal tenderness.  Musculoskeletal:  Right lower leg: No edema.     Left lower leg: No edema.  Skin:    General: Skin is warm.     Findings: No erythema or rash.  Neurological:     Mental Status: He is alert and oriented to person, place, and time.     Cranial Nerves: No cranial nerve deficit.     Gait: Gait normal.  Psychiatric:        Mood and Affect: Mood and affect normal.   ASSESSMENT AND PLAN:  Mr. Paul Gates was seen today for chronic problems.  Diagnoses and all orders for this visit:  Orders Placed This Encounter  Procedures   Flu vaccine trivalent PF, 6mos and older(Flulaval,Afluria,Fluarix,Fluzone)   Comprehensive metabolic panel with GFR   Hemoglobin A1c   Lipid panel   PSA   TSH   Lab Results  Component Value Date   TSH 2.02 04/07/2024   Lab Results  Component Value Date   CHOL 103 04/07/2024   HDL 28.80 (L) 04/07/2024   LDLCALC 11 04/07/2024   TRIG 318.0 (H) 04/07/2024   CHOLHDL 4 04/07/2024   Lab Results  Component Value Date   NA 135 04/07/2024   CL 99 04/07/2024   K 4.9 04/07/2024   CO2 27 04/07/2024   BUN 19 04/07/2024   CREATININE 0.86 04/07/2024   GFR 93.68 04/07/2024   CALCIUM  9.9 04/07/2024   ALBUMIN 4.9 04/07/2024   GLUCOSE 87 04/07/2024   Lab Results  Component Value Date   ALT 28 04/07/2024   AST 20 04/07/2024   ALKPHOS 71 04/07/2024   BILITOT 0.3 04/07/2024   Lab Results  Component Value Date   PSA 0.84 04/07/2024   PSA 0.54 07/17/2022   PSA  0.77 06/06/2021   Lab Results  Component Value Date   HGBA1C 6.4 04/07/2024   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 Last hemoglobin A1c 6.3. Continue Metformin  750 mg bid. Further recommendation will be given according to hemoglobin A1c result. Annual eye exam, periodic dental and foot care to continue. F/U in 5-6 months.  Orders: -     Hemoglobin A1c; Future  BPH associated with nocturia Assessment & Plan: Symptoms greatly improved when resuming Flomax  0.4 mg daily, no changes today. Last PSA 0.5 in 06/2022.  Orders: -     PSA; Future  Lumbar stenosis with neurogenic claudication Assessment & Plan: He follows with neurosurgeon. Currently on Flexeril , he would like to change to Methocarbamol , which he feels caused less drowsiness.  Methocarbamol  500 mg tid prn, side effects discussed. Fall precautions.  Orders: -     Methocarbamol ; Take 1 tablet (500 mg total) by mouth every 8 (eight) hours as needed for muscle spasms.  Dispense: 45 tablet; Refill: 1  Hyperlipidemia associated with type 2 diabetes mellitus (HCC) Assessment & Plan: Last LDL 36 in 04/2023. Continue rosuvastatin  10 mg daily and low-fat diet. Further recommendation will be given according to lipid panel result.  Orders: -     Comprehensive metabolic panel with GFR; Future -     Lipid panel; Future  Essential hypertension Assessment & Plan: BP mildly elevated today. He is not checking BP at home, recommended doing so. Continue losartan  100 mg daily and metoprolol  succinate 100 mg daily as well as low salt diet. Eye exam current. F/U in 6 months.  Orders: -     Comprehensive metabolic panel with GFR; Future  Hypothyroidism, acquired Assessment & Plan: Last  TSH normal at 1.5 in 04/2023. Continue levothyroxine  50 mcg daily. Further recommendation will be given according to TSI result.  Orders: -     TSH; Future  Need for influenza  vaccination -     Flu vaccine trivalent PF, 6mos and older(Flulaval,Afluria,Fluarix,Fluzone)  Return in about 21 weeks (around 09/01/2024) for chronic problems.   Adriyana Greenbaum, MD [facility]. [place] office.      [1]  Current Outpatient Medications on File Prior to Visit  Medication Sig Dispense Refill   acetaminophen  (TYLENOL  8 HOUR ARTHRITIS PAIN) 650 MG CR tablet Take 650 mg by mouth every 8 (eight) hours as needed for pain.     acetaminophen  (TYLENOL ) 500 MG tablet Take 1,000 mg by mouth every 6 (six) hours as needed for moderate pain.     albuterol  (VENTOLIN  HFA) 108 (90 Base) MCG/ACT inhaler Inhale 2 puffs into the lungs every 6 (six) hours as needed for wheezing or shortness of breath. 8 g 6   Cholecalciferol (VITAMIN D -3) 5000 UNITS TABS Take 5,000 Units by mouth daily.     levothyroxine  (SYNTHROID ) 50 MCG tablet Take 1 tablet (50 mcg total) by mouth daily before breakfast. 90 tablet 3   losartan  (COZAAR ) 100 MG tablet Take   1 tablet  Daily  for BP                                                                                                    /                                                                   TAKE                                         BY                                                 MOUTH 90 tablet 2   metFORMIN  (GLUCOPHAGE -XR) 750 MG 24 hr tablet Take 2 tablets (1,500 mg) Daily  for Diabetes                                /                                                                   TAKE  BY                                                 MOUTH 180 tablet 3   metoprolol  succinate (TOPROL  XL) 100 MG 24 hr tablet Take  1 tablet  every Morning  for BP & Palpitations 90 tablet 3   omeprazole  (PRILOSEC  OTC) 20 MG tablet Take 20 mg by mouth daily.     ONE TOUCH ULTRA TEST test strip TEST BLOOD SUGAR 3 TIMES A DAY 100 each 3   ONETOUCH DELICA LANCETS 33G MISC USE TO CHECK SUGAR DAILY 100 each 0    rosuvastatin  (CRESTOR ) 10 MG tablet TAKE 1 TABLET BY MOUTH EVERY DAY 30 tablet 0   tamsulosin  (FLOMAX ) 0.4 MG CAPS capsule Take 1 capsule (0.4 mg total) by mouth daily as needed (Kidney stones.). 90 capsule 1   varenicline  (CHANTIX ) 1 MG tablet Take 1 tablet (1 mg total) by mouth 2 (two) times daily. 180 tablet 0   vitamin C (ASCORBIC ACID) 500 MG tablet Take 1,000 mg by mouth daily.     zinc gluconate 50 MG tablet Take 50 mg by mouth daily.     No current facility-administered medications on file prior to visit.  [2] No Known Allergies

## 2024-04-07 NOTE — Assessment & Plan Note (Signed)
 Last LDL 36 in 04/2023. Continue rosuvastatin  10 mg daily and low-fat diet. Further recommendation will be given according to lipid panel result.

## 2024-04-07 NOTE — Assessment & Plan Note (Signed)
 Problem has been adequately controlled. Last hemoglobin A1c 6.3. Continue Metformin  750 mg bid. Further recommendation will be given according to hemoglobin A1c result. Annual eye exam, periodic dental and foot care to continue. F/U in 5-6 months.

## 2024-04-08 LAB — LIPID PANEL
Cholesterol: 103 mg/dL (ref 28–200)
HDL: 28.8 mg/dL — ABNORMAL LOW (ref >39.00)
LDL Cholesterol: 11 mg/dL (ref 0–99)
NonHDL: 74.59
Total CHOL/HDL Ratio: 4
Triglycerides: 318 mg/dL — ABNORMAL HIGH (ref 0.0–149.0)
VLDL: 63.6 mg/dL — ABNORMAL HIGH (ref 0.0–40.0)

## 2024-04-08 LAB — COMPREHENSIVE METABOLIC PANEL WITH GFR
ALT: 28 U/L (ref 0–53)
AST: 20 U/L (ref 5–37)
Albumin: 4.9 g/dL (ref 3.5–5.2)
Alkaline Phosphatase: 71 U/L (ref 39–117)
BUN: 19 mg/dL (ref 6–23)
CO2: 27 meq/L (ref 19–32)
Calcium: 9.9 mg/dL (ref 8.4–10.5)
Chloride: 99 meq/L (ref 96–112)
Creatinine, Ser: 0.86 mg/dL (ref 0.40–1.50)
GFR: 93.68 mL/min (ref >60.00)
Glucose, Bld: 87 mg/dL (ref 70–99)
Potassium: 4.9 meq/L (ref 3.5–5.1)
Sodium: 135 meq/L (ref 135–145)
Total Bilirubin: 0.3 mg/dL (ref 0.2–1.2)
Total Protein: 6.9 g/dL (ref 6.0–8.3)

## 2024-04-08 LAB — TSH: TSH: 2.02 u[IU]/mL (ref 0.35–5.50)

## 2024-04-08 LAB — HEMOGLOBIN A1C: Hgb A1c MFr Bld: 6.4 % (ref 4.6–6.5)

## 2024-04-08 LAB — PSA: PSA: 0.84 ng/mL (ref 0.10–4.00)

## 2024-04-10 ENCOUNTER — Ambulatory Visit: Payer: Self-pay | Admitting: Family Medicine

## 2024-04-10 NOTE — Assessment & Plan Note (Signed)
 Symptoms greatly improved when resuming Flomax  0.4 mg daily, no changes today. Last PSA 0.5 in 06/2022.

## 2024-04-10 NOTE — Assessment & Plan Note (Signed)
 He follows with neurosurgeon. Currently on Flexeril , he would like to change to Methocarbamol , which he feels caused less drowsiness.  Methocarbamol  500 mg tid prn, side effects discussed. Fall precautions.

## 2024-04-10 NOTE — Assessment & Plan Note (Signed)
 BP mildly elevated today. He is not checking BP at home, recommended doing so. Continue losartan  100 mg daily and metoprolol  succinate 100 mg daily as well as low salt diet. Eye exam current. F/U in 6 months.

## 2024-04-11 ENCOUNTER — Telehealth: Payer: Self-pay | Admitting: *Deleted

## 2024-04-11 NOTE — Telephone Encounter (Signed)
 LVMTRC

## 2024-04-11 NOTE — Telephone Encounter (Signed)
 Patient was informed of his lab results. Patient stated he does not want to start the Fenofibrate and he wants to take Fish oil instead. Patient did not have any further questions or concerns.

## 2024-04-11 NOTE — Telephone Encounter (Signed)
 Copied from CRM #[id number]. Topic: Clinical - Lab/Test Results >> Apr 11, 2024 10:06 AM Drema MATSU wrote: Reason for CRM: Patient stated that he is going to start back taking his fish oil so doctor can disregard adding fenofibrate.

## 2024-04-25 ENCOUNTER — Telehealth (HOSPITAL_BASED_OUTPATIENT_CLINIC_OR_DEPARTMENT_OTHER): Payer: Self-pay | Admitting: Family

## 2024-04-29 NOTE — Telephone Encounter (Signed)
" °*  STAT* If patient is at the pharmacy, call can be transferred to refill team.   1. Which medications need to be refilled? (please list name of each medication and dose if known)   rosuvastatin  (CRESTOR ) 10 MG tablet   2. Would you like to learn more about the convenience, safety, & potential cost savings by using the [facility]?   3. Are you open to using the [facility] (Type [facility]. ).  4. Which pharmacy/location (including street and city if local pharmacy) is medication to be sent to?  [id number] - [place], [place] - [address] [facility] [address]   5. Do they need a 30 day or 90 day supply?   Patient stated he still has medication.  Patient has appointment scheduled with KYM Finder, FNP on 4/17. "

## 2024-04-30 MED ORDER — ROSUVASTATIN CALCIUM 10 MG PO TABS: 10.0000 mg | ORAL_TABLET | Freq: Every day | ORAL | 3 refills | Status: AC

## 2024-04-30 NOTE — Telephone Encounter (Signed)
 Pt scheduled to see Reche Finder, NP, 08/08/24, refill sent.

## 2024-04-30 NOTE — Addendum Note (Signed)
 Addended by: MEMORY DELON POUR on: 04/30/2024 07:40 AM   Modules accepted: Orders

## 2024-08-08 ENCOUNTER — Ambulatory Visit (HOSPITAL_BASED_OUTPATIENT_CLINIC_OR_DEPARTMENT_OTHER): Admitting: Family

## 2024-09-01 ENCOUNTER — Ambulatory Visit: Admitting: Family Medicine
